# Patient Record
Sex: Female | Born: 1935 | Race: White | Hispanic: No | Smoking: Former smoker
Health system: Southern US, Community
[De-identification: ages and names within clinical notes are randomized; demographics above are authoritative.]

## PROBLEM LIST (undated history)

## (undated) DIAGNOSIS — N390 Urinary tract infection, site not specified: Secondary | ICD-10-CM

## (undated) DIAGNOSIS — E785 Hyperlipidemia, unspecified: Secondary | ICD-10-CM

## (undated) DIAGNOSIS — C50919 Malignant neoplasm of unspecified site of unspecified female breast: Secondary | ICD-10-CM

## (undated) DIAGNOSIS — F0390 Unspecified dementia without behavioral disturbance: Secondary | ICD-10-CM

## (undated) DIAGNOSIS — M1990 Unspecified osteoarthritis, unspecified site: Secondary | ICD-10-CM

## (undated) DIAGNOSIS — J309 Allergic rhinitis, unspecified: Secondary | ICD-10-CM

## (undated) DIAGNOSIS — I10 Essential (primary) hypertension: Secondary | ICD-10-CM

## (undated) DIAGNOSIS — K649 Unspecified hemorrhoids: Secondary | ICD-10-CM

## (undated) DIAGNOSIS — I4891 Unspecified atrial fibrillation: Secondary | ICD-10-CM

## (undated) DIAGNOSIS — R7303 Prediabetes: Secondary | ICD-10-CM

## (undated) DIAGNOSIS — I499 Cardiac arrhythmia, unspecified: Secondary | ICD-10-CM

## (undated) DIAGNOSIS — I809 Phlebitis and thrombophlebitis of unspecified site: Secondary | ICD-10-CM

## (undated) DIAGNOSIS — E162 Hypoglycemia, unspecified: Secondary | ICD-10-CM

## (undated) DIAGNOSIS — I5032 Chronic diastolic (congestive) heart failure: Secondary | ICD-10-CM

## (undated) DIAGNOSIS — K219 Gastro-esophageal reflux disease without esophagitis: Secondary | ICD-10-CM

## (undated) DIAGNOSIS — H9190 Unspecified hearing loss, unspecified ear: Secondary | ICD-10-CM

## (undated) DIAGNOSIS — I340 Nonrheumatic mitral (valve) insufficiency: Secondary | ICD-10-CM

## (undated) DIAGNOSIS — R160 Hepatomegaly, not elsewhere classified: Secondary | ICD-10-CM

## (undated) DIAGNOSIS — I951 Orthostatic hypotension: Secondary | ICD-10-CM

## (undated) HISTORY — DX: Gastro-esophageal reflux disease without esophagitis: K21.9

## (undated) HISTORY — DX: Allergic rhinitis, unspecified: J30.9

## (undated) HISTORY — DX: Prediabetes: R73.03

## (undated) HISTORY — DX: Essential (primary) hypertension: I10

## (undated) HISTORY — DX: Malignant neoplasm of unspecified site of unspecified female breast: C50.919

## (undated) HISTORY — DX: Unspecified atrial fibrillation: I48.91

## (undated) HISTORY — DX: Unspecified hearing loss, unspecified ear: H91.90

## (undated) HISTORY — DX: Hepatomegaly, not elsewhere classified: R16.0

## (undated) HISTORY — DX: Hypoglycemia, unspecified: E16.2

## (undated) HISTORY — PX: INNER EAR SURGERY: SHX679

## (undated) HISTORY — DX: Unspecified osteoarthritis, unspecified site: M19.90

## (undated) HISTORY — DX: Cardiac arrhythmia, unspecified: I49.9

## (undated) HISTORY — PX: FOOT SURGERY: SHX648

## (undated) HISTORY — DX: Urinary tract infection, site not specified: N39.0

## (undated) HISTORY — PX: BREAST LUMPECTOMY: SHX2

## (undated) HISTORY — PX: ABDOMINAL HYSTERECTOMY: SHX81

## (undated) HISTORY — DX: Phlebitis and thrombophlebitis of unspecified site: I80.9

## (undated) HISTORY — DX: Unspecified hemorrhoids: K64.9

## (undated) HISTORY — DX: Hyperlipidemia, unspecified: E78.5

## (undated) HISTORY — PX: BLADDER SUSPENSION: SHX72

---

## 1978-11-26 HISTORY — PX: CARDIAC CATHETERIZATION: SHX172

## 1999-06-10 ENCOUNTER — Encounter: Admission: RE | Admit: 1999-06-10 | Discharge: 1999-06-10 | Payer: Self-pay | Admitting: Surgery

## 1999-06-10 ENCOUNTER — Encounter: Payer: Self-pay | Admitting: Surgery

## 1999-10-25 ENCOUNTER — Encounter: Payer: Self-pay | Admitting: Geriatric Medicine

## 1999-10-25 ENCOUNTER — Inpatient Hospital Stay (HOSPITAL_COMMUNITY): Admission: EM | Admit: 1999-10-25 | Discharge: 1999-10-27 | Payer: Self-pay | Admitting: Emergency Medicine

## 1999-10-26 ENCOUNTER — Encounter: Payer: Self-pay | Admitting: Internal Medicine

## 2000-06-28 ENCOUNTER — Encounter (HOSPITAL_COMMUNITY): Payer: Self-pay | Admitting: Oncology

## 2000-06-28 ENCOUNTER — Encounter: Admission: RE | Admit: 2000-06-28 | Discharge: 2000-06-28 | Payer: Self-pay | Admitting: Oncology

## 2000-08-03 ENCOUNTER — Encounter (HOSPITAL_COMMUNITY): Admission: RE | Admit: 2000-08-03 | Discharge: 2000-09-02 | Payer: Self-pay | Admitting: Oncology

## 2000-08-03 ENCOUNTER — Encounter: Admission: RE | Admit: 2000-08-03 | Discharge: 2000-08-03 | Payer: Self-pay | Admitting: Oncology

## 2000-10-02 ENCOUNTER — Encounter: Payer: Self-pay | Admitting: Internal Medicine

## 2000-10-02 ENCOUNTER — Encounter: Admission: RE | Admit: 2000-10-02 | Discharge: 2000-10-02 | Payer: Self-pay | Admitting: Obstetrics and Gynecology

## 2001-07-01 ENCOUNTER — Encounter (HOSPITAL_COMMUNITY): Payer: Self-pay | Admitting: Oncology

## 2001-07-01 ENCOUNTER — Encounter: Admission: RE | Admit: 2001-07-01 | Discharge: 2001-07-01 | Payer: Self-pay | Admitting: Oncology

## 2001-09-13 ENCOUNTER — Ambulatory Visit (HOSPITAL_COMMUNITY): Admission: RE | Admit: 2001-09-13 | Discharge: 2001-09-13 | Payer: Self-pay | Admitting: Gastroenterology

## 2001-11-22 ENCOUNTER — Encounter: Admission: RE | Admit: 2001-11-22 | Discharge: 2001-11-22 | Payer: Self-pay | Admitting: Oncology

## 2002-04-02 ENCOUNTER — Ambulatory Visit (HOSPITAL_COMMUNITY): Admission: RE | Admit: 2002-04-02 | Discharge: 2002-04-02 | Payer: Self-pay | Admitting: Internal Medicine

## 2002-04-24 ENCOUNTER — Emergency Department (HOSPITAL_COMMUNITY): Admission: EM | Admit: 2002-04-24 | Discharge: 2002-04-24 | Payer: Self-pay | Admitting: Emergency Medicine

## 2002-07-03 ENCOUNTER — Encounter: Admission: RE | Admit: 2002-07-03 | Discharge: 2002-07-03 | Payer: Self-pay | Admitting: Oncology

## 2002-07-03 ENCOUNTER — Encounter (HOSPITAL_COMMUNITY): Payer: Self-pay | Admitting: Oncology

## 2002-08-07 ENCOUNTER — Encounter: Payer: Self-pay | Admitting: Emergency Medicine

## 2002-08-07 ENCOUNTER — Emergency Department (HOSPITAL_COMMUNITY): Admission: EM | Admit: 2002-08-07 | Discharge: 2002-08-08 | Payer: Self-pay | Admitting: Emergency Medicine

## 2002-09-25 ENCOUNTER — Observation Stay (HOSPITAL_COMMUNITY): Admission: RE | Admit: 2002-09-25 | Discharge: 2002-09-26 | Payer: Self-pay | Admitting: Urology

## 2002-09-28 ENCOUNTER — Emergency Department (HOSPITAL_COMMUNITY): Admission: EM | Admit: 2002-09-28 | Discharge: 2002-09-28 | Payer: Self-pay | Admitting: Emergency Medicine

## 2002-11-21 ENCOUNTER — Encounter (HOSPITAL_COMMUNITY): Admission: RE | Admit: 2002-11-21 | Discharge: 2002-12-21 | Payer: Self-pay | Admitting: Oncology

## 2002-11-21 ENCOUNTER — Encounter: Admission: RE | Admit: 2002-11-21 | Discharge: 2002-11-21 | Payer: Self-pay | Admitting: Oncology

## 2003-10-09 ENCOUNTER — Encounter: Admission: RE | Admit: 2003-10-09 | Discharge: 2003-10-09 | Payer: Self-pay | Admitting: Oncology

## 2003-11-20 ENCOUNTER — Encounter: Admission: RE | Admit: 2003-11-20 | Discharge: 2003-11-20 | Payer: Self-pay | Admitting: Oncology

## 2003-11-20 ENCOUNTER — Encounter (HOSPITAL_COMMUNITY): Admission: RE | Admit: 2003-11-20 | Discharge: 2003-12-20 | Payer: Self-pay | Admitting: Oncology

## 2004-08-17 ENCOUNTER — Encounter: Admission: RE | Admit: 2004-08-17 | Discharge: 2004-08-17 | Payer: Self-pay | Admitting: Internal Medicine

## 2004-10-17 ENCOUNTER — Encounter: Admission: RE | Admit: 2004-10-17 | Discharge: 2004-10-17 | Payer: Self-pay | Admitting: Oncology

## 2004-11-23 ENCOUNTER — Ambulatory Visit (HOSPITAL_COMMUNITY): Payer: Self-pay | Admitting: Oncology

## 2004-11-23 ENCOUNTER — Encounter (HOSPITAL_COMMUNITY): Admission: RE | Admit: 2004-11-23 | Discharge: 2004-12-23 | Payer: Self-pay | Admitting: Oncology

## 2004-11-23 ENCOUNTER — Encounter: Admission: RE | Admit: 2004-11-23 | Discharge: 2004-12-23 | Payer: Self-pay | Admitting: Oncology

## 2005-11-03 ENCOUNTER — Encounter: Admission: RE | Admit: 2005-11-03 | Discharge: 2005-11-03 | Payer: Self-pay | Admitting: Oncology

## 2005-11-10 ENCOUNTER — Encounter: Admission: RE | Admit: 2005-11-10 | Discharge: 2005-11-10 | Payer: Self-pay | Admitting: Internal Medicine

## 2005-11-20 ENCOUNTER — Encounter (HOSPITAL_COMMUNITY): Admission: RE | Admit: 2005-11-20 | Discharge: 2005-12-20 | Payer: Self-pay | Admitting: Oncology

## 2005-11-20 ENCOUNTER — Ambulatory Visit (HOSPITAL_COMMUNITY): Payer: Self-pay | Admitting: Oncology

## 2005-11-20 ENCOUNTER — Encounter: Admission: RE | Admit: 2005-11-20 | Discharge: 2005-11-20 | Payer: Self-pay | Admitting: Oncology

## 2006-05-29 ENCOUNTER — Encounter: Admission: RE | Admit: 2006-05-29 | Discharge: 2006-05-29 | Payer: Self-pay | Admitting: Internal Medicine

## 2006-06-04 ENCOUNTER — Encounter: Admission: RE | Admit: 2006-06-04 | Discharge: 2006-06-04 | Payer: Self-pay | Admitting: Internal Medicine

## 2006-11-09 ENCOUNTER — Encounter: Admission: RE | Admit: 2006-11-09 | Discharge: 2006-11-09 | Payer: Self-pay | Admitting: Oncology

## 2006-11-13 ENCOUNTER — Ambulatory Visit: Payer: Self-pay | Admitting: Vascular Surgery

## 2006-11-13 ENCOUNTER — Ambulatory Visit: Admission: RE | Admit: 2006-11-13 | Discharge: 2006-11-13 | Payer: Self-pay | Admitting: Internal Medicine

## 2006-11-19 ENCOUNTER — Ambulatory Visit (HOSPITAL_COMMUNITY): Payer: Self-pay | Admitting: Oncology

## 2007-11-04 ENCOUNTER — Emergency Department (HOSPITAL_COMMUNITY): Admission: EM | Admit: 2007-11-04 | Discharge: 2007-11-04 | Payer: Self-pay | Admitting: Family Medicine

## 2007-11-19 ENCOUNTER — Encounter: Admission: RE | Admit: 2007-11-19 | Discharge: 2007-11-19 | Payer: Self-pay | Admitting: Oncology

## 2007-12-17 ENCOUNTER — Ambulatory Visit (HOSPITAL_COMMUNITY): Payer: Self-pay | Admitting: Oncology

## 2008-02-10 ENCOUNTER — Ambulatory Visit: Payer: Self-pay | Admitting: Surgery

## 2008-03-14 ENCOUNTER — Emergency Department (HOSPITAL_COMMUNITY): Admission: EM | Admit: 2008-03-14 | Discharge: 2008-03-14 | Payer: Self-pay | Admitting: Emergency Medicine

## 2008-11-20 ENCOUNTER — Encounter: Admission: RE | Admit: 2008-11-20 | Discharge: 2008-11-20 | Payer: Self-pay | Admitting: Oncology

## 2009-02-05 ENCOUNTER — Ambulatory Visit (HOSPITAL_COMMUNITY): Payer: Self-pay | Admitting: Oncology

## 2010-01-21 ENCOUNTER — Encounter: Admission: RE | Admit: 2010-01-21 | Discharge: 2010-01-21 | Payer: Self-pay | Admitting: Oncology

## 2010-02-04 ENCOUNTER — Encounter (HOSPITAL_COMMUNITY): Admission: RE | Admit: 2010-02-04 | Payer: Self-pay | Admitting: Oncology

## 2010-04-15 ENCOUNTER — Ambulatory Visit: Admit: 2010-04-15 | Payer: Self-pay | Admitting: Internal Medicine

## 2010-04-17 ENCOUNTER — Encounter (HOSPITAL_COMMUNITY): Payer: Self-pay | Admitting: Oncology

## 2010-04-17 ENCOUNTER — Emergency Department (HOSPITAL_COMMUNITY)
Admission: EM | Admit: 2010-04-17 | Discharge: 2010-04-17 | Payer: Self-pay | Source: Home / Self Care | Admitting: Emergency Medicine

## 2010-04-19 LAB — CBC
Hemoglobin: 15.3 g/dL — ABNORMAL HIGH (ref 12.0–15.0)
MCH: 30.5 pg (ref 26.0–34.0)
MCHC: 33.9 g/dL (ref 30.0–36.0)
MCV: 90 fL (ref 78.0–100.0)
Platelets: 192 10*3/uL (ref 150–400)
RBC: 5.01 MIL/uL (ref 3.87–5.11)
RDW: 12.7 % (ref 11.5–15.5)

## 2010-04-19 LAB — DIFFERENTIAL: Basophils Absolute: 0 10*3/uL (ref 0.0–0.1)

## 2010-04-19 LAB — POCT I-STAT, CHEM 8
BUN: 12 mg/dL (ref 6–23)
Chloride: 107 mEq/L (ref 96–112)
Potassium: 4.5 mEq/L (ref 3.5–5.1)
TCO2: 30 mmol/L (ref 0–100)

## 2010-07-08 ENCOUNTER — Other Ambulatory Visit (HOSPITAL_COMMUNITY): Payer: Self-pay | Admitting: Oncology

## 2010-07-08 ENCOUNTER — Encounter (HOSPITAL_COMMUNITY): Payer: Medicare Other | Attending: Oncology | Admitting: Oncology

## 2010-07-08 DIAGNOSIS — Z8551 Personal history of malignant neoplasm of bladder: Secondary | ICD-10-CM | POA: Insufficient documentation

## 2010-07-08 DIAGNOSIS — Z853 Personal history of malignant neoplasm of breast: Secondary | ICD-10-CM | POA: Insufficient documentation

## 2010-07-08 DIAGNOSIS — I10 Essential (primary) hypertension: Secondary | ICD-10-CM | POA: Insufficient documentation

## 2010-07-08 DIAGNOSIS — C50919 Malignant neoplasm of unspecified site of unspecified female breast: Secondary | ICD-10-CM

## 2010-07-08 DIAGNOSIS — Z79899 Other long term (current) drug therapy: Secondary | ICD-10-CM | POA: Insufficient documentation

## 2010-07-08 LAB — DIFFERENTIAL
Lymphocytes Relative: 15 % (ref 12–46)
Lymphs Abs: 2.2 10*3/uL (ref 0.7–4.0)
Neutro Abs: 11.4 10*3/uL — ABNORMAL HIGH (ref 1.7–7.7)
Neutrophils Relative %: 79 % — ABNORMAL HIGH (ref 43–77)

## 2010-07-08 LAB — CBC
MCHC: 34.4 g/dL (ref 30.0–36.0)
Platelets: 197 10*3/uL (ref 150–400)
RDW: 13.3 % (ref 11.5–15.5)

## 2010-07-08 LAB — COMPREHENSIVE METABOLIC PANEL
ALT: 55 U/L — ABNORMAL HIGH (ref 0–35)
AST: 45 U/L — ABNORMAL HIGH (ref 0–37)
Alkaline Phosphatase: 63 U/L (ref 39–117)
CO2: 32 mEq/L (ref 19–32)
Calcium: 9.8 mg/dL (ref 8.4–10.5)
GFR calc Af Amer: >60 mL/min (ref >60)
GFR calc non Af Amer: >60 mL/min (ref >60)
Potassium: 4.8 mEq/L (ref 3.5–5.1)
Total Protein: 7.4 g/dL (ref 6.0–8.3)

## 2010-07-09 LAB — CANCER ANTIGEN 27.29: CA 27.29: 14 U/mL (ref 0–39)

## 2010-07-23 ENCOUNTER — Inpatient Hospital Stay (INDEPENDENT_AMBULATORY_CARE_PROVIDER_SITE_OTHER)
Admission: RE | Admit: 2010-07-23 | Discharge: 2010-07-23 | Disposition: A | Discharge status: Home / Self Care | Payer: Medicare Other | Source: Ambulatory Visit | Attending: Emergency Medicine | Admitting: Emergency Medicine

## 2010-07-23 DIAGNOSIS — S91009A Unspecified open wound, unspecified ankle, initial encounter: Secondary | ICD-10-CM

## 2010-08-09 NOTE — Assessment & Plan Note (Signed)
OFFICE VISIT   Alexis Winters, Alexis Winters  DOB:  1927-04-05                                       02/10/2008  [id number]#:[id number]   REASON FOR VISIT:  Evaluate swelling.   HISTORY:  This is a 77 year old female I am seeing at the request of Dr.  Donette Larry for evaluation of leg swelling.  The patient states that she has  had problems with swelling for many, many years.  She is a history which  began back in 1956 where she developed left leg phlebitis and she has  had problems with swelling ever since.  She has had a total of about 3  episodes of recurrent cellulitis, last of which was 1 year ago.  She has  tried stockings in the past but has yet to receive a pair that has  fitted appropriately.  She has not had any major ulceration or wound  problems.  She does have occasional discomfort.   REVIEW OF SYSTEMS:  CONSTITUTIONAL:  Negative fevers, chills weight  gain, weight loss.  CARDIAC:  Positive for palpitations, heart murmur.  PULMONARY:  Negative.  GI:  Negative.  GU:  Negative.  VASCULAR:  Pain in legs with walking and a history of phlebitis.  NEURO:  Negative.  ORTHO:  Positive arthritis, joint pain, muscle pain.  PSYCH:  Negative.  ENT:  Change in hearing.  HEMATOLOGIC:  Negative.   PAST MEDICAL HISTORY:  Hypertension, heart failure, history of bladder  sling, history of breast cancer.   FAMILY HISTORY:  Positive for cardiovascular disease in her brother at  age 29.   SOCIAL HISTORY:  She has 3 children.  She is retired.  Does not smoke.  Has a history smoking but quit in 1967.   MEDICATIONS:  Include Toprol, Lasix, trimethoprim, Klor-Con,  simvastatin, aspirin, acid reducer, fish oil.   ALLERGIES:  IV dye.   PHYSICAL EXAMINATION:  Her blood pressure is 147/81, pulse 60.  General:  She is well appearing in no distress.  Cardiovascular:  Regular rate and  rhythm, respirations nonlabored.  Extremities are well perfused.  Pedal  pulses are not palpable.   She has nonpitting edema left greater than  right.  No ulceration.   DIAGNOSTIC STUDIES:  Venous duplex was performed today which shows no  evidence of deep venous thrombosis.  No evidence of superficial venous  thrombosis and no evidence of reflux.   ASSESSMENT/PLAN:  Bilateral leg swelling, left greater than right.   PLAN:  The most likely etiology for the patient's swelling is  lymphedema.  I told her that the best treatment option at this time is  going to be compression therapy.  I have recommend that she see a  lymphedema therapist.  She will need to be fitted for appropriate  stockings to avoid potential future complications including ulceration.   Jorge Ny, MD  Electronically Signed   VWB/MEDQ  D:  02/10/2008  T:  02/11/2008  Job:  [id number]   cc:   Georgann Housekeeper, MD

## 2010-08-09 NOTE — Procedures (Signed)
DUPLEX DEEP VENOUS EXAM - LOWER EXTREMITY   INDICATION:  Bilateral lower extremity edema.   HISTORY:  Edema:  Bilateral lower extremity edema.  Trauma/Surgery:  No.  Pain:  No.  PE:  No.  Previous DVT:  History of phlebitis in the 1950s.  Anticoagulants:  No.  Other:  No.   DUPLEX EXAM:                CFV   SFV   PopV  PTV    GSV                R  L  R  L  R  L  R   L  R  L  Thrombosis    o  o  o  o  o  o  o   o  o  o  Spontaneous   +  +  +  +  +  +  +   +  +  +  Phasic        +  +  +  +  +  +  +   +  +  +  Augmentation  +  +  +  +  +  +  +   +  +  +  Compressible  +  +  +  +  +  +  +   +  +  +  Competent     +  +  +  +  +  +  +   +  +  +   Legend:  + - yes  o - no  p - partial  D - decreased   IMPRESSION:  1. No evidence of deep or superficial venous thrombus bilaterally.  2. No evidence of baker's cyst bilaterally.  3. No evidence of significant venous incompetence bilaterally.    _____________________________  V. Charlena Cross, MD   MC/MEDQ  D:  02/10/2008  T:  02/10/2008  Job:  [id number]

## 2010-08-12 NOTE — Op Note (Signed)
   NAME:  Alexis Winters, Alexis Winters                        ACCOUNT NO.:  [id number]   MEDICAL RECORD NO.:  [id number]                   PATIENT TYPE:  OBV   LOCATION:  0376                                 FACILITY:  [facility]   PHYSICIAN:  Sigmund I. Patsi Sears, M.D.         DATE OF BIRTH:  1927-03-29   DATE OF PROCEDURE:  DATE OF DISCHARGE:                                 OPERATIVE REPORT   PREOPERATIVE DIAGNOSIS:  Hematuria.   POSTOPERATIVE DIAGNOSIS:  Hematuria.   OPERATION/PROCEDURE:  Flexible sigmoidoscopy.   SURGEON:  Sigmund I. Patsi Sears, M.D.   ASSISTANT:  ______________.   INDICATIONS FOR PROCEDURE:  After Mrs. Colasurdo' sling procedure for stress  urinary incontinence, we received a call that the patient had gross  hematuria in her Foley catheter.  We returned to the operating room.  The  patient was still under general anesthesia.  We removed the patient's Foley  catheter and performed a flexible cystoscopy.   FINDINGS:  There were two small blood clots in the patient's bladder but no  actual sites of bleeding were seen.  There was some mild erythema in the  lateral walls of her bladder.  Specifically no foreign material was  identified under careful cystoscopic surveillance.  Therefore, the  cystoscope was removed and Foley catheter was reinserted.   DISPOSITION:  To the post anesthesia care unit in stable condition.  Please  note that Dr. Patsi Sears participated throughout this case.  There were no  complications.     UNKNOWN DICTATOR                          Sigmund I. Patsi Sears, M.D.    UD/MEDQ  D:  09/25/2002  T:  09/25/2002  Job:  [id number]

## 2010-08-12 NOTE — Discharge Summary (Signed)
[place]  Patient:    Alexis Winters                        MRN: [id number] Adm. Date:  [id number] Disc. Date: [id number] Attending:  Katha Cabal                           Discharge Summary  CONSULTATIONS:  Dr. Mayford Knife, cardiology.  HISTORY OF PRESENT ILLNESS/HOSPITAL COURSE:  Alexis Winters is a 77 year old white female with a long history of palpitations dating back to the 1980s, who was admitted on 10/25/99 with a history of awakening that morning with some substernal chest pressure and diaphoresis.  The patient also was with significant dyspnea, particularly on exertion, and felt presyncopal.  She denies any palpitations at the time of admission, but had noted that her heart seemed to be beating slowly the day of admission.  The patient is with a history of heart catheterization in 1982 with a reportedly normal study.  Apparently, she had reaction to the dye that was used.  MEDICATIONS:  On admission included: 1. Lanoxin. 2. Lopressor. 3. Dyazide. 4. Potassium.  On admission, her pulse was noted to be 70 and irregular, blood pressure was 132/80, respiratory rate was 24.  The patient was noted to have bibasilar crackles and to be in irregularly regular rhythm without murmur.  She had some chronic lower extremity edema.  The EKG showed new onset atrial fibrillation, and the patient was sent to the hospital from the office and admitted by Dr. Pete Glatter.  The patient was anticoagulated.  A spiral CT scan of the chest was obtained and pulmonary embolus was indeed ruled out by that study.  Cardiac enzymes, including CPK, MB, and troponin I were ordered on a serial basis, all of which returned normal as well.  The EKG did not show any ischemic changes.  The patient converted to normal sinus rhythm the morning of October 26, 1999. Dr. Deirdre Pippins subsequently evaluated the patient and recommended the addition of Cardizem to her beta  blockage, oral Coumadin, and to plan for an outpatient Cardiolite study to rule out ischemic etiology.  Apparently, she had had a recent transthoracic echocardiogram that was fairly normal.  Apparently, there is a question of mitral valve prolapse.  The patient was started on Cardizem CD 120 mg daily, Coumadin 5 mg daily.  She was tolerating both of these medications on the morning of October 27, 1999. Her lungs had cleared.  Cardiovascular exam was normal and a TSH that had been ordered previously was also within normal limits.  The patient was thus discharged in stable condition on Lopressor 50 mg twice daily, Dyazide 37.5/12.5 1 tablet daily, potassium tablet once daily, Cardizem CD 120 mg once daily, Coumadin 5 mg once daily, and to discontinue her Lanoxin.  She was to follow up at the [facility] office for a pro time the following morning, to call the office if she developed chest pain, shortness of breath, or palpitations; Dr. Carroll Sage office to arrange for outpatient Cardiolite; and to follow up with Dr. Daphine Deutscher in two weeks. DD:  10/29/99 TD:  10/31/99 Job: [id number]

## 2010-08-12 NOTE — Op Note (Signed)
   NAME:  Alexis Winters, Alexis Winters                        ACCOUNT NO.:  [id number]   MEDICAL RECORD NO.:  [id number]                   PATIENT TYPE:  AMB   LOCATION:  DAY                                  FACILITY:  [facility]   PHYSICIAN:  Sigmund I. Patsi Sears, M.D.         DATE OF BIRTH:  [birth year]   DATE OF PROCEDURE:  09/25/2002  DATE OF DISCHARGE:                                 OPERATIVE REPORT   PREOPERATIVE DIAGNOSIS:  Urinary incontinence.   POSTOPERATIVE DIAGNOSIS:  Urinary incontinence.   PROCEDURE:  Mentor OB tape pubovaginal sling.   SURGEON:  Sigmund I. Patsi Sears, M.D.   Threasa HeadsIleene Hutchinson, M.D.   PREOPERATIVE PREPARATION:  After appropriate preanesthesia, the patient was  brought into the operating room and placed on the operating table in the  dorsal supine position, where general LMA anesthesia was induced.  She was  replaced in dorsal lithotomy position, where the pubis was prepped with  Betadine solution and draped in the usual fashion.   REVIEW OF HISTORY:  The patient is a 77 year old female with a history of  stress urinary incontinence, positive Marshall test, positive Q-Tip test,  urodynamic leak point pressure of 16 cmH2O.  Note her past history of breast  cancer, phlebitis, and a history of congestive heart failure.  Patient now  for pubovaginal sling.   PROCEDURE:  Marcaine 0.25% plus epinephrine 1:200,000 10 mL was injected  into the periurethral pubovaginal cervical tissue.  Following this, a  midline incision is made over the urethra and subcutaneous tissue was  dissected bilaterally.  Two stab wounds are then placed 5 cm lateral to the  clitoris, over the obturator bone, and bilateral obturator fossae tractors  were placed, bringing the sling into its position.  Cystoscopy showed no  evidence of the sling within the bladder.  With the right angle clamp behind  the sling in the midline for tensioning, the sides of the tape were cut  subcutaneously, and a  portion of 2 x 7 cm Tutoplast fascia was used to cover  the sling.  The vaginal incision was then closed in one layer with running 3-  0 Vicryl suture.   Some bleeding was noted from the stab wounds, and this was controlled with  sutures and pressure.  Dermabond was used to close the perivaginal stab  wounds and the patient was awakened after given IV Toradol, taken to the  recovery room in good condition.                                               Sigmund I. Patsi Sears, M.D.    SIT/MEDQ  D:  09/25/2002  T:  09/25/2002  Job:  [id number]

## 2010-08-12 NOTE — H&P (Signed)
[place]  Patient:    Alexis Winters, Alexis Winters                       MRN: [id number] Adm. Date:  10/25/99 Attending:  Ann Maki T. Stoneking, M.D. CC:         Richard A. Alanda Amass, M.D.                         History and Physical  CHIEF COMPLAINT: Ms. Batres is a very nice 77 year old white female with a long history of palpitations dating back to the 9s.  Some question if she has had supraventricular tachycardia in the past.  She apparently had a heart catheterization in 1982, reportedly normal study but according to the family she arrested during the procedure requiring cardioversion.  She has since that time been on Lanoxin 0.125 mg q.d., Lopressor 50 mg b.i.d.  She was in essentially her usual state of health until the last week or two when she occasionally has felt a little bit weak and tired.  This morning she awakened with substernal chest pressure and diaphoresis.  HISTORY OF PRESENT ILLNESS: This lasted about five to ten minutes and then during the day today she noticed increasing dyspnea on exertion with minimal exertion, just taking a couple of steps, which is new.  This evening she felt as if she would faint.  She has not been aware of any palpitations but has noted that her heart seems to be beating slow.  ALLERGIES: No known drug allergies.  CURRENT MEDICATIONS:  1. Lanoxin 0.25 mg 1/2 tablet q.d.  2. Lopressor 50 mg b.i.d.  3. Dyazide 37.5/12.5 mg q.d.  4. Potassium tablet q.d.  PAST MEDICAL HISTORY:  1. Left-sided breast cancer, status post lumpectomy in 1991 and tamoxifen     therapy.  2. SVT.  3. Question of history of mitral valve prolapse.  4. History of presbycusis, wears hearing aids.  There is no history of known coronary artery disease, stroke, tuberculosis, or diabetes.  PAST SURGICAL HISTORY:  1. Hysterectomy.  2. Surgery of left tympanic membrane in the past.  3. Repair of left ankle fracture.  4. Left  lumpectomy.  FAMILY HISTORY: Father died at age 30, had a stroke.  Mother died at age 49 of renal disease.  Two daughters and one son in fairly good health.  SOCIAL HISTORY: She is a widow and has three children.  She does not smoke and does not consume alcohol.  REVIEW OF SYSTEMS: She has had no trouble with headaches though she has some pain occasionally in the back of her neck, especially this morning.  She has had no acute change in her vision.  Hearing loss has been stable and the hearing aids seem to work fairly well.  CARDIOVASCULAR: Please see HPI.  She denies any problem with change in her bowels and states her bowels move fairly regularly and there is no dysuria.  No abdominal pain.  PHYSICAL EXAMINATION:  VITAL SIGNS: Pulse 70 and irregular.  Blood pressure 132/80, respiratory rate 24.  SKIN: Warm and dry.  HEENT: PERRL.  Fundi normal.  Right TM normal, left occluded by cerumen.  LUNGS: Crackles noted at the bases.  HEART: Irregular rhythm without murmur.  ABDOMEN: Obese, no hepatosplenomegaly or mass palpated.  RECTAL: Normal tone.  There was no stool in the rectal vault.  NEUROLOGIC: Nonfocal.  EXTREMITIES: There was 1+ edema of the left ankle, which  is chronic, and trace edema on the right.  ASSESSMENT:  1. New onset atrial fibrillation, rate controlled by examination on digoxin     and Lopressor; however, she presents with chest pain and moderate     shortness of breath, and question if she as mild congestive heart failure     due to the atrial fibrillation.  Also cannot rule out pulmonary embolism     as a cause of the chest pain, shortness of breath, and new onset of atrial     fibrillation.  2. Obesity.  PLAN: Will admit, anticoagulate, rule out MI.  Will check chest x-ray to look for signs of failure and also CT scan to rule out pulmonary embolus.  Will monitor her rhythm and will have Dr. Alanda Amass see her to help convert her back to normal sinus  rhythm if he feels this is indicated at this time. DD:  10/25/99 TD:  10/26/99 Job: [id number]

## 2010-08-12 NOTE — Procedures (Signed)
[place]  Patient:    Alexis Winters, Alexis Winters Visit Number: [id number] MRN: [id number]          Service Type: END Location: ENDO Attending Physician:  Dennison Bulla Ii Dictated by:   Verlin Grills, M.D. Proc. Date: 09/13/01 Admit Date:  09/13/2001 Discharge Date: 09/13/2001   CC:         Darius Bump, M.D.   Procedure Report  REFERRING PHYSICIAN:  Darius Bump, M.D.  INDICATIONS FOR PROCEDURE:  [name] is a 77 year old female born 1927-03-31.  [name] is undergoing diagnostic colonoscopy to evaluate intermittent hematochezia.  [name] is in chronic atrial fibrillation and takes Coumadin anticoagulation.  I discussed with [name] the complications associated with colonoscopy and polypectomy, including intestinal bleeding and intestinal perforation. [name] has signed the operative permit.  ENDOSCOPIST:  Verlin Grills, M.D.  PREMEDICATIONS: 1. Phentanyl 50 mcg. 2. Versed 6 mg.  ENDOSCOPE:  Olympus pediatric colonoscope.  DESCRIPTION OF PROCEDURE:  After obtaining informed consent, [name] was placed in the left lateral decubitus position.  I administered intravenous phentanyl and intravenous Versed to achieve conscious sedation for the procedure.  The patients blood pressure, oxygen saturation, and cardiac rhythm were monitored throughout the procedure and documented in the medical record.  Anal inspection was normal.  Digital rectal examination was normal.  The Olympus pediatric video colonoscope was then introduced into the rectum and easily advanced to the cecum.  Colonic preparation for the examination today was excellent.  Rectum normal.  Sigmoid colon and descending colon normal.  Splenic flexure normal.  Transverse colon normal.  Hepatic flexure normal.  Ascending colon normal.  Cecum and ileocecal valve normal.  ASSESSMENT:  Normal  proctocolonoscopy to the cecum.  No endoscopic evidence for the presence of colorectal neoplasia or lower gastrointestinal bleeding.Dictated by:   Verlin Grills, M.D. Attending Physician:  Dennison Bulla Ii DD:  09/13/01 TD:  09/15/01 Job: [id number]

## 2010-10-26 ENCOUNTER — Other Ambulatory Visit: Payer: Self-pay | Admitting: Neurology

## 2010-10-26 DIAGNOSIS — R259 Unspecified abnormal involuntary movements: Secondary | ICD-10-CM

## 2010-10-26 DIAGNOSIS — R269 Unspecified abnormalities of gait and mobility: Secondary | ICD-10-CM

## 2010-10-26 DIAGNOSIS — I4891 Unspecified atrial fibrillation: Secondary | ICD-10-CM

## 2010-10-26 DIAGNOSIS — F068 Other specified mental disorders due to known physiological condition: Secondary | ICD-10-CM

## 2010-10-27 ENCOUNTER — Ambulatory Visit
Admission: RE | Admit: 2010-10-27 | Discharge: 2010-10-27 | Disposition: A | Discharge status: Home / Self Care | Payer: Medicare Other | Source: Ambulatory Visit | Attending: Neurology | Admitting: Neurology

## 2010-10-27 DIAGNOSIS — I4891 Unspecified atrial fibrillation: Secondary | ICD-10-CM

## 2010-10-27 DIAGNOSIS — R269 Unspecified abnormalities of gait and mobility: Secondary | ICD-10-CM

## 2010-10-27 DIAGNOSIS — F068 Other specified mental disorders due to known physiological condition: Secondary | ICD-10-CM

## 2010-10-27 DIAGNOSIS — R259 Unspecified abnormal involuntary movements: Secondary | ICD-10-CM

## 2010-12-23 LAB — POCT URINALYSIS DIP (DEVICE)
Nitrite: NEGATIVE
Operator id: 200941
Protein, ur: NEGATIVE
Urobilinogen, UA: 0.2

## 2010-12-29 LAB — DIFFERENTIAL
Basophils Relative: 0 % (ref 0–1)
Eosinophils Absolute: 0 10*3/uL (ref 0.0–0.7)
Lymphocytes Relative: 15 % (ref 12–46)
Neutro Abs: 7.6 10*3/uL (ref 1.7–7.7)
Neutrophils Relative %: 76 % (ref 43–77)

## 2010-12-29 LAB — CBC
HCT: 44.7 % (ref 36.0–46.0)
MCHC: 35.1 g/dL (ref 30.0–36.0)
MCV: 90.9 fL (ref 78.0–100.0)
Platelets: 109 10*3/uL — ABNORMAL LOW (ref 150–400)
RDW: 12.4 % (ref 11.5–15.5)
WBC: 9.9 10*3/uL (ref 4.0–10.5)

## 2010-12-29 LAB — COMPREHENSIVE METABOLIC PANEL
Albumin: 3.7 g/dL (ref 3.5–5.2)
BUN: 12 mg/dL (ref 6–23)
Chloride: 107 mEq/L (ref 96–112)
Creatinine, Ser: 0.8 mg/dL (ref 0.4–1.2)
GFR calc Af Amer: >60 mL/min (ref >60)
GFR calc non Af Amer: >60 mL/min (ref >60)
Glucose, Bld: 120 mg/dL — ABNORMAL HIGH (ref 70–99)
Sodium: 139 mEq/L (ref 135–145)

## 2011-04-19 ENCOUNTER — Other Ambulatory Visit (HOSPITAL_COMMUNITY): Payer: Self-pay | Admitting: Oncology

## 2011-04-19 DIAGNOSIS — Z1231 Encounter for screening mammogram for malignant neoplasm of breast: Secondary | ICD-10-CM

## 2011-04-21 ENCOUNTER — Ambulatory Visit
Admission: RE | Admit: 2011-04-21 | Discharge: 2011-04-21 | Disposition: A | Discharge status: Home / Self Care | Payer: Medicare Other | Source: Ambulatory Visit | Attending: Oncology | Admitting: Oncology

## 2011-04-21 ENCOUNTER — Ambulatory Visit: Payer: Medicare Other

## 2011-04-21 DIAGNOSIS — Z1231 Encounter for screening mammogram for malignant neoplasm of breast: Secondary | ICD-10-CM

## 2011-04-26 ENCOUNTER — Other Ambulatory Visit (HOSPITAL_COMMUNITY): Payer: Self-pay | Admitting: Oncology

## 2011-04-26 DIAGNOSIS — R928 Other abnormal and inconclusive findings on diagnostic imaging of breast: Secondary | ICD-10-CM

## 2011-05-05 ENCOUNTER — Ambulatory Visit
Admission: RE | Admit: 2011-05-05 | Discharge: 2011-05-05 | Disposition: A | Discharge status: Home / Self Care | Payer: Medicare Other | Source: Ambulatory Visit | Attending: Oncology | Admitting: Oncology

## 2011-05-05 DIAGNOSIS — R928 Other abnormal and inconclusive findings on diagnostic imaging of breast: Secondary | ICD-10-CM

## 2011-05-05 DIAGNOSIS — Z853 Personal history of malignant neoplasm of breast: Secondary | ICD-10-CM | POA: Diagnosis not present

## 2011-07-07 ENCOUNTER — Encounter (HOSPITAL_COMMUNITY): Payer: Medicare Other | Admitting: Oncology

## 2011-07-14 ENCOUNTER — Ambulatory Visit (HOSPITAL_COMMUNITY): Payer: Medicare Other | Admitting: Oncology

## 2011-07-14 DIAGNOSIS — M5137 Other intervertebral disc degeneration, lumbosacral region: Secondary | ICD-10-CM | POA: Diagnosis not present

## 2011-07-14 DIAGNOSIS — M1710 Unilateral primary osteoarthritis, unspecified knee: Secondary | ICD-10-CM | POA: Diagnosis not present

## 2011-07-14 DIAGNOSIS — M76899 Other specified enthesopathies of unspecified lower limb, excluding foot: Secondary | ICD-10-CM | POA: Diagnosis not present

## 2011-11-02 DIAGNOSIS — Z1331 Encounter for screening for depression: Secondary | ICD-10-CM | POA: Diagnosis not present

## 2011-11-02 DIAGNOSIS — I10 Essential (primary) hypertension: Secondary | ICD-10-CM | POA: Diagnosis not present

## 2011-11-02 DIAGNOSIS — F411 Generalized anxiety disorder: Secondary | ICD-10-CM | POA: Diagnosis not present

## 2011-11-02 DIAGNOSIS — R5383 Other fatigue: Secondary | ICD-10-CM | POA: Diagnosis not present

## 2011-12-07 DIAGNOSIS — H6120 Impacted cerumen, unspecified ear: Secondary | ICD-10-CM | POA: Diagnosis not present

## 2011-12-07 DIAGNOSIS — H903 Sensorineural hearing loss, bilateral: Secondary | ICD-10-CM | POA: Diagnosis not present

## 2012-02-06 DIAGNOSIS — C50919 Malignant neoplasm of unspecified site of unspecified female breast: Secondary | ICD-10-CM | POA: Diagnosis not present

## 2012-02-06 DIAGNOSIS — Z23 Encounter for immunization: Secondary | ICD-10-CM | POA: Diagnosis not present

## 2012-02-06 DIAGNOSIS — M1990 Unspecified osteoarthritis, unspecified site: Secondary | ICD-10-CM | POA: Diagnosis not present

## 2012-02-06 DIAGNOSIS — I10 Essential (primary) hypertension: Secondary | ICD-10-CM | POA: Diagnosis not present

## 2012-03-29 DIAGNOSIS — R3989 Other symptoms and signs involving the genitourinary system: Secondary | ICD-10-CM | POA: Diagnosis not present

## 2012-03-29 DIAGNOSIS — N3020 Other chronic cystitis without hematuria: Secondary | ICD-10-CM | POA: Diagnosis not present

## 2012-04-04 DIAGNOSIS — J069 Acute upper respiratory infection, unspecified: Secondary | ICD-10-CM | POA: Diagnosis not present

## 2012-04-10 ENCOUNTER — Other Ambulatory Visit (HOSPITAL_COMMUNITY): Payer: Self-pay | Admitting: Oncology

## 2012-04-10 DIAGNOSIS — Z1231 Encounter for screening mammogram for malignant neoplasm of breast: Secondary | ICD-10-CM

## 2012-04-11 ENCOUNTER — Ambulatory Visit: Payer: Medicare Other

## 2012-04-11 ENCOUNTER — Ambulatory Visit (HOSPITAL_COMMUNITY): Payer: Medicare Other

## 2012-04-12 ENCOUNTER — Ambulatory Visit (HOSPITAL_COMMUNITY): Payer: Medicare Other

## 2012-04-12 ENCOUNTER — Encounter (HOSPITAL_COMMUNITY): Payer: Self-pay | Admitting: Oncology

## 2012-04-12 ENCOUNTER — Encounter (HOSPITAL_COMMUNITY): Payer: Medicare Other | Attending: Oncology | Admitting: Oncology

## 2012-04-12 VITALS — BP 153/63 | HR 71 | Temp 97.4°F | Resp 16 | Wt 126.8 lb

## 2012-04-12 DIAGNOSIS — Z853 Personal history of malignant neoplasm of breast: Secondary | ICD-10-CM

## 2012-04-12 DIAGNOSIS — Z170 Estrogen receptor positive status [ER+]: Secondary | ICD-10-CM

## 2012-04-12 DIAGNOSIS — C50919 Malignant neoplasm of unspecified site of unspecified female breast: Secondary | ICD-10-CM

## 2012-04-12 NOTE — Patient Instructions (Addendum)
[facility] Discharge Instructions  RECOMMENDATIONS MADE BY THE CONSULTANT AND ANY TEST RESULTS WILL BE SENT TO YOUR REFERRING PHYSICIAN.  EXAM FINDINGS BY THE PHYSICIAN TODAY AND SIGNS OR SYMPTOMS TO REPORT TO CLINIC OR PRIMARY PHYSICIAN: exam and discussion by MD.  Alexis Winters are doing well.  MEDICATIONS PRESCRIBED:  none  INSTRUCTIONS GIVEN AND DISCUSSED: Report any new lumps, bone pain or shortness of breath.  SPECIAL INSTRUCTIONS/FOLLOW-UP: Return for follow-up in 1 year.  Thank you for choosing [facility] to provide your oncology and hematology care.  To afford each patient quality time with our providers, please arrive at least 15 minutes before your scheduled appointment time.  With your help, our goal is to use those 15 minutes to complete the necessary work-up to ensure our physicians have the information they need to help with your evaluation and healthcare recommendations.    Effective January 1st, 2014, we ask that you re-schedule your appointment with our physicians should you arrive 10 or more minutes late for your appointment.  We strive to give you quality time with our providers, and arriving late affects you and other patients whose appointments are after yours.    Again, thank you for choosing [facility].  Our hope is that these requests will decrease the amount of time that you wait before being seen by our physicians.       _____________________________________________________________  Should you have questions after your visit to [facility], please contact our office at [phone] between the hours of 8:30 a.m. and 5:00 p.m.  Voicemails left after 4:30 p.m. will not be returned until the following business day.  For prescription refill requests, have your pharmacy contact our office with your prescription refill request.

## 2012-04-12 NOTE — Progress Notes (Signed)
Problem number 1 stage I, left-sided breast cancer, infiltrating ductal type, 1.0 cm, ER/PR positive, status post lumpectomy followed by radiation therapy to the left breast with surgery in August 1991 thus far without recurrence. She took 5 years of adjuvant tamoxifen. Problem #2 history of CHF Problem #3 mild memory loss Problem #4 obesity in the past which is resolved Problem #5 difficulty hearing Problem #6 severe degenerative arthritis the left knee unable to have surgery but she is wearing a brace. Problem #7 IVP dye allergy  She is accompanied by her daughter Andrey Campanile. She is doing very well but has moved from [place] to the [facility] area. She is very close to her daughter. They are within 1/2 mile of each other.  The patient appears more forgetful. Otherwise she is very pleasant. She is not aware any lumps or bumps anywhere. Her daughter helps her with several questions. Otherwise her vital signs are good but her weight is down 4 more pounds in one year and 9 months. She is in no acute distress. Lymph nodes are negative throughout. Both breasts are negative for any masses. Heart shows no distinct murmur rub or gallop. There appears to be a regular rhythm and rate. Abdomen is soft and nontender without hepatosplenomegaly. She has chronic lower left leg  swelling. She is wearing a brace on her left knee. She appears to have more in the way of fatty ankles rather than pitting edema.   She did lose her oldest daughter to the complications of cerebral palsy this past year. Her daughter was 81 years old.   I will not do blood work on her today. I do not see that  she needs it. Her daughter states that she had recent blood work by her primary care physician.  We will see her in one year.

## 2012-05-16 ENCOUNTER — Ambulatory Visit
Admission: RE | Admit: 2012-05-16 | Discharge: 2012-05-16 | Disposition: A | Discharge status: Home / Self Care | Payer: Medicare Other | Source: Ambulatory Visit | Attending: Oncology | Admitting: Oncology

## 2012-05-16 DIAGNOSIS — Z1231 Encounter for screening mammogram for malignant neoplasm of breast: Secondary | ICD-10-CM | POA: Diagnosis not present

## 2012-05-23 DIAGNOSIS — C50919 Malignant neoplasm of unspecified site of unspecified female breast: Secondary | ICD-10-CM | POA: Diagnosis not present

## 2012-05-23 DIAGNOSIS — N3090 Cystitis, unspecified without hematuria: Secondary | ICD-10-CM | POA: Diagnosis not present

## 2012-05-23 DIAGNOSIS — I10 Essential (primary) hypertension: Secondary | ICD-10-CM | POA: Diagnosis not present

## 2012-05-23 DIAGNOSIS — M1990 Unspecified osteoarthritis, unspecified site: Secondary | ICD-10-CM | POA: Diagnosis not present

## 2012-06-25 ENCOUNTER — Encounter (HOSPITAL_COMMUNITY): Payer: Self-pay | Admitting: *Deleted

## 2012-06-25 ENCOUNTER — Emergency Department (HOSPITAL_COMMUNITY)
Admission: EM | Admit: 2012-06-25 | Discharge: 2012-06-26 | Disposition: A | Discharge status: Home / Self Care | Payer: Medicare Other | Attending: Emergency Medicine | Admitting: Emergency Medicine

## 2012-06-25 ENCOUNTER — Emergency Department (HOSPITAL_COMMUNITY): Payer: Medicare Other

## 2012-06-25 DIAGNOSIS — S99929A Unspecified injury of unspecified foot, initial encounter: Secondary | ICD-10-CM | POA: Insufficient documentation

## 2012-06-25 DIAGNOSIS — Z87891 Personal history of nicotine dependence: Secondary | ICD-10-CM | POA: Insufficient documentation

## 2012-06-25 DIAGNOSIS — W010XXA Fall on same level from slipping, tripping and stumbling without subsequent striking against object, initial encounter: Secondary | ICD-10-CM | POA: Insufficient documentation

## 2012-06-25 DIAGNOSIS — S99919A Unspecified injury of unspecified ankle, initial encounter: Secondary | ICD-10-CM | POA: Diagnosis not present

## 2012-06-25 DIAGNOSIS — S8990XA Unspecified injury of unspecified lower leg, initial encounter: Secondary | ICD-10-CM | POA: Diagnosis not present

## 2012-06-25 DIAGNOSIS — W19XXXA Unspecified fall, initial encounter: Secondary | ICD-10-CM

## 2012-06-25 DIAGNOSIS — S0003XA Contusion of scalp, initial encounter: Secondary | ICD-10-CM | POA: Diagnosis not present

## 2012-06-25 DIAGNOSIS — Z8679 Personal history of other diseases of the circulatory system: Secondary | ICD-10-CM | POA: Diagnosis not present

## 2012-06-25 DIAGNOSIS — S6990XA Unspecified injury of unspecified wrist, hand and finger(s), initial encounter: Secondary | ICD-10-CM | POA: Insufficient documentation

## 2012-06-25 DIAGNOSIS — Z853 Personal history of malignant neoplasm of breast: Secondary | ICD-10-CM | POA: Insufficient documentation

## 2012-06-25 DIAGNOSIS — S1093XA Contusion of unspecified part of neck, initial encounter: Secondary | ICD-10-CM | POA: Insufficient documentation

## 2012-06-25 DIAGNOSIS — I509 Heart failure, unspecified: Secondary | ICD-10-CM | POA: Insufficient documentation

## 2012-06-25 DIAGNOSIS — T148XXA Other injury of unspecified body region, initial encounter: Secondary | ICD-10-CM

## 2012-06-25 DIAGNOSIS — Z7982 Long term (current) use of aspirin: Secondary | ICD-10-CM | POA: Diagnosis not present

## 2012-06-25 DIAGNOSIS — S0993XA Unspecified injury of face, initial encounter: Secondary | ICD-10-CM | POA: Diagnosis not present

## 2012-06-25 DIAGNOSIS — Z8669 Personal history of other diseases of the nervous system and sense organs: Secondary | ICD-10-CM | POA: Insufficient documentation

## 2012-06-25 DIAGNOSIS — Z79899 Other long term (current) drug therapy: Secondary | ICD-10-CM | POA: Diagnosis not present

## 2012-06-25 DIAGNOSIS — Z862 Personal history of diseases of the blood and blood-forming organs and certain disorders involving the immune mechanism: Secondary | ICD-10-CM | POA: Insufficient documentation

## 2012-06-25 DIAGNOSIS — S79919A Unspecified injury of unspecified hip, initial encounter: Secondary | ICD-10-CM | POA: Insufficient documentation

## 2012-06-25 DIAGNOSIS — Y9289 Other specified places as the place of occurrence of the external cause: Secondary | ICD-10-CM | POA: Insufficient documentation

## 2012-06-25 DIAGNOSIS — S0083XA Contusion of other part of head, initial encounter: Secondary | ICD-10-CM | POA: Diagnosis not present

## 2012-06-25 DIAGNOSIS — Y9389 Activity, other specified: Secondary | ICD-10-CM | POA: Insufficient documentation

## 2012-06-25 DIAGNOSIS — Z8639 Personal history of other endocrine, nutritional and metabolic disease: Secondary | ICD-10-CM | POA: Insufficient documentation

## 2012-06-25 DIAGNOSIS — S0990XA Unspecified injury of head, initial encounter: Secondary | ICD-10-CM | POA: Insufficient documentation

## 2012-06-25 DIAGNOSIS — M25559 Pain in unspecified hip: Secondary | ICD-10-CM | POA: Diagnosis not present

## 2012-06-25 DIAGNOSIS — M25539 Pain in unspecified wrist: Secondary | ICD-10-CM | POA: Diagnosis not present

## 2012-06-25 DIAGNOSIS — M25569 Pain in unspecified knee: Secondary | ICD-10-CM | POA: Diagnosis not present

## 2012-06-25 DIAGNOSIS — S59909A Unspecified injury of unspecified elbow, initial encounter: Secondary | ICD-10-CM | POA: Diagnosis not present

## 2012-06-25 DIAGNOSIS — S79929A Unspecified injury of unspecified thigh, initial encounter: Secondary | ICD-10-CM | POA: Diagnosis not present

## 2012-06-25 LAB — POCT I-STAT, CHEM 8
BUN: 15 mg/dL (ref 6–23)
Calcium, Ion: 1.19 mmol/L (ref 1.13–1.30)
Chloride: 106 mEq/L (ref 96–112)
Creatinine, Ser: 0.6 mg/dL (ref 0.50–1.10)
Glucose, Bld: 107 mg/dL — ABNORMAL HIGH (ref 70–99)

## 2012-06-25 LAB — CBC WITH DIFFERENTIAL/PLATELET
Basophils Absolute: 0 10*3/uL (ref 0.0–0.1)
Eosinophils Relative: 2 % (ref 0–5)
HCT: 39.4 % (ref 36.0–46.0)
Hemoglobin: 14.2 g/dL (ref 12.0–15.0)
Lymphocytes Relative: 23 % (ref 12–46)
Lymphs Abs: 1.6 10*3/uL (ref 0.7–4.0)
MCV: 87.4 fL (ref 78.0–100.0)
Monocytes Absolute: 0.4 10*3/uL (ref 0.1–1.0)
Monocytes Relative: 6 % (ref 3–12)
Neutro Abs: 4.9 10*3/uL (ref 1.7–7.7)
WBC: 7.1 10*3/uL (ref 4.0–10.5)

## 2012-06-25 LAB — GLUCOSE, CAPILLARY: Glucose-Capillary: 130 mg/dL — ABNORMAL HIGH (ref 70–99)

## 2012-06-25 MED ORDER — FENTANYL CITRATE 0.05 MG/ML IJ SOLN
25.0000 ug | Freq: Once | INTRAMUSCULAR | Status: DC
  Filled 2012-06-25: qty 2

## 2012-06-25 NOTE — ED Notes (Signed)
Reports having broken bones in the left leg but is not a candidate for surgery, she is able to ambulate with a knee brace. Brace is off at this time.

## 2012-06-25 NOTE — ED Provider Notes (Signed)
I saw and evaluated the patient, reviewed the resident's note and I agree with the findings and plan.  Pt awake and alert, no midline cspine pain, pt comfortable without complaints of pain.  Xrays and CT scans reassuring.  Discharged with strict return precautions.  Pt agreeable with plan.  Ethelda Chick, MD 06/25/12 [phone]

## 2012-06-25 NOTE — ED Notes (Signed)
Pt has bruising on left hip and c/o left wrist pain.

## 2012-06-25 NOTE — ED Notes (Signed)
Pt was trying to get out of a chair, became unsteady and fell to the ground, taking the impact with her L side.  No loc or anticoagulants.  Pt c/o pain to L forehead (bruising and 1x1 inch hematoma, L hand pain and L hip pain (L leg with multiple broken bones already to L leg).

## 2012-06-25 NOTE — ED Provider Notes (Signed)
History     CSN: [id number]  Arrival date & time 06/25/12  1814   First MD Initiated Contact with Patient 06/25/12 1951     Chief Complaint  Patient presents with  . Fall    pain to head, hand and hip   HPI  79 y/o female who presents from home after fall. The patient states that she was attempting to get out of a chair when she lost her balance falling on her left side. She states she hit her head but doesn't believe she lost consciosness. She is complaining of pain on the left side of her head, left wrist, left hip and left knee. She states her pain is currently a 4/10. She states movement makes her pain worse.   Past Medical History  Diagnosis Date  . Breast cancer   . CHF (congestive heart failure)   . Hypoglycemia   . Hard of hearing   . Phlebitis   . Allergy   . Phlebitis     l leg    Past Surgical History  Procedure Laterality Date  . Abdominal hysterectomy    . Bladder suspension    . Breast lumpectomy    . Foot surgery      left foot  . Inner ear surgery      Family History  Problem Relation Age of Onset  . Cerebral palsy Sister   . Alzheimer's disease Brother     History  Substance Use Topics  . Smoking status: Former Games developer  . Smokeless tobacco: Never Used  . Alcohol Use: No    OB History   Grav Para Term Preterm Abortions TAB SAB Ect Mult Living                  Review of Systems  Constitutional: Negative for fever and chills.  HENT: Negative for neck pain.   Respiratory: Negative for shortness of breath.   Cardiovascular: Negative for chest pain.  Musculoskeletal: Positive for arthralgias (left hip, left knee).  Neurological: Positive for headaches. Negative for weakness and numbness.  All other systems reviewed and are negative.    Allergies  Contrast media  Home Medications   Current Outpatient Rx  Name  Route  Sig  Dispense  Refill  . aspirin 81 MG tablet   Oral   Take 81 mg by mouth daily.         Marland Kitchen diltiazem (CARDIZEM  CD) 120 MG 24 hr capsule   Oral   Take 120 mg by mouth daily.          . furosemide (LASIX) 40 MG tablet   Oral   Take 40 mg by mouth 2 (two) times daily.          Marland Kitchen KLOR-CON M20 20 MEQ tablet   Oral   Take 20 mEq by mouth 2 (two) times daily.          . ranitidine (ZANTAC) 150 MG tablet   Oral   Take 150 mg by mouth daily.         . TOPROL XL 50 MG 24 hr tablet   Oral   Take 75 mg by mouth daily.            BP 129/77  Pulse 64  Temp(Src) 98.1 F (36.7 C) (Oral)  Resp 18  SpO2 100%  Physical Exam  Nursing note and vitals reviewed. Constitutional: She is oriented to person, place, and time. She appears well-developed and well-nourished. No distress.  HENT:  Head: Normocephalic. Head is with contusion.    Mouth/Throat: No oropharyngeal exudate.  Eyes: Conjunctivae and EOM are normal. Pupils are equal, round, and reactive to light.  Neck: Normal range of motion. Neck supple.  Cardiovascular: Normal rate and regular rhythm.  Exam reveals no gallop and no friction rub.   No murmur heard. Pulses:      Radial pulses are 2+ on the right side, and 2+ on the left side.       Dorsalis pedis pulses are 1+ on the right side, and 1+ on the left side.       Posterior tibial pulses are 1+ on the right side, and 1+ on the left side.  Pulmonary/Chest: Effort normal and breath sounds normal.  Abdominal: Soft. She exhibits no distension. There is no tenderness.  Musculoskeletal: She exhibits edema (+2 LE edema bilaterally).       Left wrist: She exhibits tenderness and bony tenderness.       Left hip: She exhibits tenderness and bony tenderness.       Left knee: Tenderness found. Medial joint line and lateral joint line tenderness noted.       Cervical back: She exhibits no tenderness and no bony tenderness.       Thoracic back: She exhibits no tenderness and no bony tenderness.       Lumbar back: She exhibits no tenderness and no bony tenderness.       Legs: Ecchymosis  over lateral aspect of left thigh.  Neurological: She is alert and oriented to person, place, and time. She has normal strength. No sensory deficit. GCS eye subscore is 4. GCS verbal subscore is 5. GCS motor subscore is 6.  Skin: Skin is warm and dry.  Psychiatric: She has a normal mood and affect.    ED Course  Procedures (including critical care time)  Labs Reviewed  GLUCOSE, CAPILLARY - Abnormal; Notable for the following:    Glucose-Capillary 130 (*)    All other components within normal limits  CBC WITH DIFFERENTIAL - Abnormal; Notable for the following:    Platelets 131 (*)    All other components within normal limits  POCT I-STAT, CHEM 8 - Abnormal; Notable for the following:    Glucose, Bld 107 (*)    All other components within normal limits   Dg Pelvis 1-2 Views  06/25/2012  *RADIOLOGY REPORT*  Clinical Data: Left hip pain after fall down steps.  PELVIS - 1-2 VIEW  Comparison: 06/23/2010  Findings: As in the lower lumbar spine and hips.  The pelvic rim, SI joints, and symphysis pubis appear intact.  No displaced fractures identified.  No focal bone lesion or bone destruction. Calcified phleboliths in the pelvis.  Calcifications in the soft tissues over the left hip likely representing injection sites.  No significant change since previous study.  IMPRESSION: Degenerative changes in the lower lumbar spine and hips.  No displaced fractures identified.   Original Report Authenticated By: Burman Nieves, M.D.    Dg Wrist Complete Left  06/25/2012  *RADIOLOGY REPORT*  Clinical Data: Left wrist pain after fall down steps.  The  LEFT WRIST - COMPLETE 3+ VIEW  Comparison: None.  Findings: Diffuse bone demineralization.  Degenerative narrowing of the radial carpal and STT joints.  No evidence of acute fracture or subluxation.  No focal bone lesion or bone destruction.  Bone cortex and trabecular architecture appear intact.  IMPRESSION: Diffuse bone demineralization and degenerative changes.   No displaced fractures identified.  Original Report Authenticated By: Burman Nieves, M.D.    Ct Head Wo Contrast  06/25/2012  *RADIOLOGY REPORT*  Clinical Data:  Fall  CT HEAD WITHOUT CONTRAST CT CERVICAL SPINE WITHOUT CONTRAST  Technique:  Multidetector CT imaging of the head and cervical spine was performed following the standard protocol without intravenous contrast.  Multiplanar CT image reconstructions of the cervical spine were also generated.  Comparison:  October 27, 2010  CT HEAD  Findings: Global atrophy and chronic ischemic changes are stable. There is no mass effect, midline shift, or acute intracranial hemorrhage.  Mastoid air cells are clear.  Cranium is intact.  IMPRESSION: No acute intracranial pathology.  CT CERVICAL SPINE  Findings: No acute fracture and no dislocation.  Multilevel facet arthropathy throughout the cervical spine.  Severe narrowing at C5- 6 and C6-7.  No obvious soft tissue injury.  Normal appearing thyroid gland.  IMPRESSION: No acute bony injury in the cervical spine peri degenerative changes.   Original Report Authenticated By: Jolaine Click, M.D.    Ct Cervical Spine Wo Contrast  06/25/2012  *RADIOLOGY REPORT*  Clinical Data:  Fall  CT HEAD WITHOUT CONTRAST CT CERVICAL SPINE WITHOUT CONTRAST  Technique:  Multidetector CT imaging of the head and cervical spine was performed following the standard protocol without intravenous contrast.  Multiplanar CT image reconstructions of the cervical spine were also generated.  Comparison:  October 27, 2010  CT HEAD  Findings: Global atrophy and chronic ischemic changes are stable. There is no mass effect, midline shift, or acute intracranial hemorrhage.  Mastoid air cells are clear.  Cranium is intact.  IMPRESSION: No acute intracranial pathology.  CT CERVICAL SPINE  Findings: No acute fracture and no dislocation.  Multilevel facet arthropathy throughout the cervical spine.  Severe narrowing at C5- 6 and C6-7.  No obvious soft tissue  injury.  Normal appearing thyroid gland.  IMPRESSION: No acute bony injury in the cervical spine peri degenerative changes.   Original Report Authenticated By: Jolaine Click, M.D.    Dg Knee Complete 4 Views Left  06/25/2012  *RADIOLOGY REPORT*  Clinical Data: Left knee pain after fall down steps.  LEFT KNEE - COMPLETE 4+ VIEW  Comparison: 06/23/2010  Findings: Prominent degenerative changes in the left knee involving medial, lateral, patellofemoral compartments with tricompartmental narrowing and osteophytosis.  Changes are most prominent in the lateral compartment.  Subcortical cysts are present predominately in the medial tibial plateau.  Changes are stable since the previous study.  No evidence of acute fracture or subluxation.  No significant effusion.  No radiopaque soft tissue foreign bodies.  IMPRESSION: Diffuse degenerative changes in the left knee appears stable since previous study.  No acute bony abnormalities identified.   Original Report Authenticated By: Burman Nieves, M.D.      1. Fall, initial encounter   2. Head injury, initial encounter   3. Hematoma       MDM   79 y/o female who presents from home after mechanical fall who presents with a left sided facial contusion, left knee pain, and left hip pain. N/V intact.   Imaging without acute fracture. Labs unremarkable. Tylenol as needed for pain. Follow up with pcp as needed.          Shanon Ace, MD 06/25/12 [phone]

## 2012-07-26 DIAGNOSIS — M1710 Unilateral primary osteoarthritis, unspecified knee: Secondary | ICD-10-CM | POA: Diagnosis not present

## 2012-09-06 DIAGNOSIS — N3020 Other chronic cystitis without hematuria: Secondary | ICD-10-CM | POA: Diagnosis not present

## 2012-09-06 DIAGNOSIS — R3989 Other symptoms and signs involving the genitourinary system: Secondary | ICD-10-CM | POA: Diagnosis not present

## 2012-09-19 DIAGNOSIS — R609 Edema, unspecified: Secondary | ICD-10-CM | POA: Diagnosis not present

## 2012-09-19 DIAGNOSIS — C50919 Malignant neoplasm of unspecified site of unspecified female breast: Secondary | ICD-10-CM | POA: Diagnosis not present

## 2012-09-19 DIAGNOSIS — I4891 Unspecified atrial fibrillation: Secondary | ICD-10-CM | POA: Diagnosis not present

## 2012-09-19 DIAGNOSIS — I10 Essential (primary) hypertension: Secondary | ICD-10-CM | POA: Diagnosis not present

## 2012-09-20 DIAGNOSIS — N3020 Other chronic cystitis without hematuria: Secondary | ICD-10-CM | POA: Diagnosis not present

## 2012-10-24 DIAGNOSIS — H9190 Unspecified hearing loss, unspecified ear: Secondary | ICD-10-CM | POA: Diagnosis not present

## 2012-10-24 DIAGNOSIS — I10 Essential (primary) hypertension: Secondary | ICD-10-CM | POA: Diagnosis not present

## 2012-12-27 DIAGNOSIS — F411 Generalized anxiety disorder: Secondary | ICD-10-CM | POA: Diagnosis not present

## 2012-12-27 DIAGNOSIS — R5381 Other malaise: Secondary | ICD-10-CM | POA: Diagnosis not present

## 2012-12-27 DIAGNOSIS — M25569 Pain in unspecified knee: Secondary | ICD-10-CM | POA: Diagnosis not present

## 2013-01-03 DIAGNOSIS — M1710 Unilateral primary osteoarthritis, unspecified knee: Secondary | ICD-10-CM | POA: Diagnosis not present

## 2013-01-16 DIAGNOSIS — Z0000 Encounter for general adult medical examination without abnormal findings: Secondary | ICD-10-CM | POA: Diagnosis not present

## 2013-01-16 DIAGNOSIS — Z1331 Encounter for screening for depression: Secondary | ICD-10-CM | POA: Diagnosis not present

## 2013-01-16 DIAGNOSIS — L84 Corns and callosities: Secondary | ICD-10-CM | POA: Diagnosis not present

## 2013-01-16 DIAGNOSIS — C50919 Malignant neoplasm of unspecified site of unspecified female breast: Secondary | ICD-10-CM | POA: Diagnosis not present

## 2013-01-16 DIAGNOSIS — I509 Heart failure, unspecified: Secondary | ICD-10-CM | POA: Diagnosis not present

## 2013-01-16 DIAGNOSIS — M1990 Unspecified osteoarthritis, unspecified site: Secondary | ICD-10-CM | POA: Diagnosis not present

## 2013-01-16 DIAGNOSIS — I10 Essential (primary) hypertension: Secondary | ICD-10-CM | POA: Diagnosis not present

## 2013-01-16 DIAGNOSIS — Z23 Encounter for immunization: Secondary | ICD-10-CM | POA: Diagnosis not present

## 2013-01-23 DIAGNOSIS — I509 Heart failure, unspecified: Secondary | ICD-10-CM | POA: Diagnosis not present

## 2013-01-29 ENCOUNTER — Ambulatory Visit: Payer: Self-pay | Admitting: Podiatry

## 2013-02-06 DIAGNOSIS — H35039 Hypertensive retinopathy, unspecified eye: Secondary | ICD-10-CM | POA: Diagnosis not present

## 2013-02-07 DIAGNOSIS — R197 Diarrhea, unspecified: Secondary | ICD-10-CM | POA: Diagnosis not present

## 2013-02-13 ENCOUNTER — Ambulatory Visit: Payer: Self-pay | Admitting: Podiatry

## 2013-02-27 DIAGNOSIS — R197 Diarrhea, unspecified: Secondary | ICD-10-CM | POA: Diagnosis not present

## 2013-03-06 DIAGNOSIS — R197 Diarrhea, unspecified: Secondary | ICD-10-CM | POA: Diagnosis not present

## 2013-03-07 DIAGNOSIS — R197 Diarrhea, unspecified: Secondary | ICD-10-CM | POA: Diagnosis not present

## 2013-03-10 DIAGNOSIS — R197 Diarrhea, unspecified: Secondary | ICD-10-CM | POA: Diagnosis not present

## 2013-04-11 ENCOUNTER — Ambulatory Visit (HOSPITAL_COMMUNITY): Payer: Medicare Other

## 2013-04-12 NOTE — Progress Notes (Signed)
This encounter was created in error - please disregard.

## 2013-07-08 DIAGNOSIS — M1710 Unilateral primary osteoarthritis, unspecified knee: Secondary | ICD-10-CM | POA: Diagnosis not present

## 2013-08-15 DIAGNOSIS — H6120 Impacted cerumen, unspecified ear: Secondary | ICD-10-CM | POA: Diagnosis not present

## 2013-08-28 DIAGNOSIS — H905 Unspecified sensorineural hearing loss: Secondary | ICD-10-CM | POA: Diagnosis not present

## 2013-08-28 DIAGNOSIS — H903 Sensorineural hearing loss, bilateral: Secondary | ICD-10-CM | POA: Diagnosis not present

## 2013-08-28 DIAGNOSIS — H6120 Impacted cerumen, unspecified ear: Secondary | ICD-10-CM | POA: Diagnosis not present

## 2013-08-29 ENCOUNTER — Encounter: Payer: Self-pay | Admitting: Cardiovascular Disease

## 2013-08-29 DIAGNOSIS — I509 Heart failure, unspecified: Secondary | ICD-10-CM | POA: Diagnosis not present

## 2013-08-29 DIAGNOSIS — R002 Palpitations: Secondary | ICD-10-CM | POA: Diagnosis not present

## 2013-08-29 DIAGNOSIS — R609 Edema, unspecified: Secondary | ICD-10-CM | POA: Diagnosis not present

## 2013-09-04 ENCOUNTER — Encounter: Payer: Self-pay | Admitting: *Deleted

## 2013-09-05 ENCOUNTER — Encounter: Payer: Self-pay | Admitting: Cardiovascular Disease

## 2013-09-05 ENCOUNTER — Ambulatory Visit (INDEPENDENT_AMBULATORY_CARE_PROVIDER_SITE_OTHER): Payer: Medicare Other | Admitting: Cardiovascular Disease

## 2013-09-05 VITALS — BP 138/64 | HR 63 | Ht 59.0 in | Wt 129.0 lb

## 2013-09-05 DIAGNOSIS — I480 Paroxysmal atrial fibrillation: Secondary | ICD-10-CM | POA: Insufficient documentation

## 2013-09-05 DIAGNOSIS — I509 Heart failure, unspecified: Secondary | ICD-10-CM | POA: Diagnosis not present

## 2013-09-05 DIAGNOSIS — R609 Edema, unspecified: Secondary | ICD-10-CM | POA: Diagnosis not present

## 2013-09-05 DIAGNOSIS — IMO0002 Reserved for concepts with insufficient information to code with codable children: Secondary | ICD-10-CM | POA: Insufficient documentation

## 2013-09-05 DIAGNOSIS — R600 Localized edema: Secondary | ICD-10-CM

## 2013-09-05 DIAGNOSIS — I4891 Unspecified atrial fibrillation: Secondary | ICD-10-CM

## 2013-09-05 DIAGNOSIS — I5032 Chronic diastolic (congestive) heart failure: Secondary | ICD-10-CM | POA: Insufficient documentation

## 2013-09-05 NOTE — Patient Instructions (Addendum)
  I have recommended that you elevate your legs.  An ideal way is to get a [facility] Leg rest - invented by one of our local vascular surgeons.  Website:  [email or web address]  If you need further help , you may call  [phone]     Your physician has requested that you have an echocardiogram.  Echocardiography is a painless test that uses sound waves to create images of your heart. It provides your doctor with information about the size and shape of your heart and how well your heart's chambers and valves are working. This procedure takes approximately one hour. There are no restrictions for this procedure.  MEDICATION CHANGES:  STOP DILTIAZEM ( CARDIZEM)   Your physician recommends that you schedule a follow-up appointment in: [place] DR. Acie Fredrickson  You will need lab work today:  TSH We will call you with your results

## 2013-09-05 NOTE — Assessment & Plan Note (Addendum)
Alexis Winters carries The diagnosis of congestive heart failure. I do not have any specific details.  We will get an echo.  She is extremely frail. Her symptoms may be related to coronary artery disease but in her current condition she would be a very poor candidate for cardiac cath and subsequent intervention. Long talk with her and her daughter and her daughter understands this very well. I suspect that Alexis Winters has developed some mild dementia so it may be difficult for her to tell us of her symptoms but given her overall condition, I think that a conservative approach is warranted.  I'll see her back in the office in several months for followup visit.

## 2013-09-05 NOTE — Assessment & Plan Note (Addendum)
Alexis Winters presents with worsening leg edema. She's had leg edema for many years and has been on Lasix. Edema has been gradually worsening. She had a pro-BNP drawn at her medical doctor's office and it was only minimally elevated-218.  She has spells of severe shortness of breath. She has severe dyspnea on exertion but she actually appears to have more failure to thrive and congestive heart failure symptoms.  We'll get an echocardiogram to evaluate her left ventricle systolic function. She carries the diagnosis of congestive heart failure but do not have any further details of her CHF.  I've given her instructions on obtaining the [facility] leg rest.

## 2013-09-05 NOTE — Assessment & Plan Note (Signed)
Continue with aspirin. I do not think that she's a Coumadin candidate

## 2013-09-05 NOTE — Progress Notes (Signed)
Alexis Winters Date of Birth  1927-12-11       [facility] [address], [place], [place]  [address]   [place], [place]  [address] [place]   Fax  [phone]     Fax [phone]  Problem List: 1. Atrial fibrillation 2. Congestive heart failure 3. Cardiac cath with cardiac arrest  History of Present Illness:  [name] is a 80 yo with hx of CHF, palpitations ( lasts for a minute or so).  This past Friday, she was seen at her medical doctors office - tachycardia, fatigue, lots of peripheral edema.    She has these spells on occasion.    For the past 2 weeks she has had progressive "spells" with severe dyspnea, profound fatigue,  Some mild , vague chest tightness, tachypalpitations.  She also has some left hand tingling.    Current Outpatient Prescriptions on File Prior to Visit  Medication Sig Dispense Refill  . aspirin 81 MG tablet Take 81 mg by mouth daily.      Marland Kitchen diltiazem (CARDIZEM CD) 120 MG 24 hr capsule Take 120 mg by mouth daily.       . diphenoxylate-atropine (LOMOTIL) 2.5-0.025 MG per tablet Take by mouth as needed for diarrhea or loose stools.      . docusate sodium (COLACE) 100 MG capsule Take 100 mg by mouth as needed.       . furosemide (LASIX) 40 MG tablet Take 40 mg by mouth 2 (two) times daily.       Marland Kitchen KLOR-CON M20 20 MEQ tablet Take 20 mEq by mouth 2 (two) times daily.       . Methen-Hyosc-Meth Blue-Na Phos (UROGESIC-BLUE) 81.6 MG TABS Take by mouth as needed.      . Multiple Vitamin (MULTIVITAMIN) tablet Take 1 tablet by mouth daily.      . TOPROL XL 50 MG 24 hr tablet Take 75 mg by mouth daily.       . vitamin B-12 (CYANOCOBALAMIN) 1000 MCG tablet Take 1,000 mcg by mouth daily.       No current facility-administered medications on file prior to visit.    Allergies  Allergen Reactions  . Contrast Media [Iodinated Diagnostic Agents]   . Pyridium  [Phenazopyridine Hcl]     Past Medical History  Diagnosis Date  . CHF (congestive heart failure)   . Hypoglycemia   . Hard of hearing   . Phlebitis   . Allergy   . Phlebitis     l leg  . Hypertension   . Frequent UTI   . Enlarged liver   . Hemorrhoids   . Dyslipidemia   . OA (osteoarthritis)     hip and back  . Atrial fibrillation   . Allergic rhinitis   . GERD (gastroesophageal reflux disease)   . Prediabetes   . Breast cancer   . Arrhythmia     Past Surgical History  Procedure Laterality Date  . Abdominal hysterectomy    . Bladder suspension    . Breast lumpectomy    . Foot surgery      left foot  . Inner ear surgery    . Cardiac catheterization  1980's    [facility]    History  Smoking status  . Former Smoker  Smokeless tobacco  . Never Used    History  Alcohol Use No    Family  History  Problem Relation Age of Onset  . Cerebral palsy Sister   . Alzheimer's disease Brother   . Hypertension Father   . Heart disease Brother     Reviw of Systems:  Reviewed in the HPI.  All other systems are negative.  Physical Exam: Blood pressure 138/64, pulse 63, height 4\' 11"  (1.499 m), weight 129 lb (58.514 kg). Wt Readings from Last 3 Encounters:  09/05/13 129 lb (58.514 kg)  04/12/12 126 lb 12.8 oz (57.516 kg)     General: Frail, elderly white female. She is hard of hearing. She seems to be easily distracted. I suspect that she has mild dementia.  Head: Normocephalic, atraumatic, sclera non-icteric, mucus membranes are moist,   Neck: Supple. Carotids are 2 + without bruits. No JVD   Lungs: Clear   Heart:  Regular rate, S1-S2.  Abdomen: Soft, non-tender, non-distended with normal bowel sounds.  Msk:  Strength and tone are normal   Extremities: No clubbing or cyanosis. 2+ pitting edema.  The legs appear to be chronically edematous.  Distal pedal pulses are 2+ and equal  She's quite frail. She walks very slowly and seemed to need a lot of assistance. We  had to assist her  getting up and down off the exam table.  Neuro: CN II - XII intact.  Except she is partially deaf.  Alert and oriented X 3.   Psych:   She seems to have at least mild dementia. Her hearing impairment makes assessment of this difficulty.  ECG: 09/05/2012: Normal sinus rhythm at 63. She has no ST or T wave changes.  Assessment / Plan:

## 2013-09-05 NOTE — Assessment & Plan Note (Signed)
Her symptoms are somewhat vague. She has a history of congestive heart failure but her symptoms really do not sound like congestive heart therapy just feels of weakness and some tachycardia. I suspect that she has failure to thrive.  We'll check a TSH today.

## 2013-09-11 ENCOUNTER — Encounter (HOSPITAL_COMMUNITY): Payer: Self-pay | Admitting: Emergency Medicine

## 2013-09-11 ENCOUNTER — Emergency Department (HOSPITAL_COMMUNITY): Payer: Medicare Other

## 2013-09-11 ENCOUNTER — Observation Stay (HOSPITAL_COMMUNITY)
Admission: EM | Admit: 2013-09-11 | Discharge: 2013-09-12 | Disposition: A | Discharge status: Home / Self Care | Payer: Medicare Other | Attending: Internal Medicine | Admitting: Internal Medicine

## 2013-09-11 DIAGNOSIS — Z9010 Acquired absence of unspecified breast and nipple: Secondary | ICD-10-CM | POA: Diagnosis not present

## 2013-09-11 DIAGNOSIS — I509 Heart failure, unspecified: Secondary | ICD-10-CM | POA: Insufficient documentation

## 2013-09-11 DIAGNOSIS — G3184 Mild cognitive impairment, so stated: Secondary | ICD-10-CM | POA: Insufficient documentation

## 2013-09-11 DIAGNOSIS — H9190 Unspecified hearing loss, unspecified ear: Secondary | ICD-10-CM | POA: Insufficient documentation

## 2013-09-11 DIAGNOSIS — I10 Essential (primary) hypertension: Secondary | ICD-10-CM | POA: Diagnosis not present

## 2013-09-11 DIAGNOSIS — I209 Angina pectoris, unspecified: Secondary | ICD-10-CM | POA: Insufficient documentation

## 2013-09-11 DIAGNOSIS — Z87891 Personal history of nicotine dependence: Secondary | ICD-10-CM | POA: Insufficient documentation

## 2013-09-11 DIAGNOSIS — R5383 Other fatigue: Secondary | ICD-10-CM

## 2013-09-11 DIAGNOSIS — R0602 Shortness of breath: Secondary | ICD-10-CM | POA: Diagnosis not present

## 2013-09-11 DIAGNOSIS — Z853 Personal history of malignant neoplasm of breast: Secondary | ICD-10-CM | POA: Insufficient documentation

## 2013-09-11 DIAGNOSIS — M25559 Pain in unspecified hip: Secondary | ICD-10-CM | POA: Diagnosis not present

## 2013-09-11 DIAGNOSIS — E785 Hyperlipidemia, unspecified: Secondary | ICD-10-CM | POA: Diagnosis not present

## 2013-09-11 DIAGNOSIS — R079 Chest pain, unspecified: Secondary | ICD-10-CM | POA: Diagnosis not present

## 2013-09-11 DIAGNOSIS — R55 Syncope and collapse: Secondary | ICD-10-CM | POA: Diagnosis not present

## 2013-09-11 DIAGNOSIS — I208 Other forms of angina pectoris: Secondary | ICD-10-CM | POA: Diagnosis present

## 2013-09-11 DIAGNOSIS — K219 Gastro-esophageal reflux disease without esophagitis: Secondary | ICD-10-CM | POA: Diagnosis not present

## 2013-09-11 DIAGNOSIS — R002 Palpitations: Secondary | ICD-10-CM | POA: Insufficient documentation

## 2013-09-11 DIAGNOSIS — E119 Type 2 diabetes mellitus without complications: Secondary | ICD-10-CM | POA: Insufficient documentation

## 2013-09-11 DIAGNOSIS — R600 Localized edema: Secondary | ICD-10-CM | POA: Diagnosis present

## 2013-09-11 DIAGNOSIS — J9819 Other pulmonary collapse: Secondary | ICD-10-CM | POA: Insufficient documentation

## 2013-09-11 DIAGNOSIS — R5381 Other malaise: Secondary | ICD-10-CM | POA: Insufficient documentation

## 2013-09-11 DIAGNOSIS — I2089 Other forms of angina pectoris: Secondary | ICD-10-CM | POA: Diagnosis present

## 2013-09-11 DIAGNOSIS — R609 Edema, unspecified: Secondary | ICD-10-CM | POA: Diagnosis not present

## 2013-09-11 DIAGNOSIS — I4891 Unspecified atrial fibrillation: Secondary | ICD-10-CM | POA: Insufficient documentation

## 2013-09-11 DIAGNOSIS — I5032 Chronic diastolic (congestive) heart failure: Secondary | ICD-10-CM | POA: Insufficient documentation

## 2013-09-11 DIAGNOSIS — Z7982 Long term (current) use of aspirin: Secondary | ICD-10-CM | POA: Insufficient documentation

## 2013-09-11 DIAGNOSIS — M542 Cervicalgia: Principal | ICD-10-CM | POA: Insufficient documentation

## 2013-09-11 DIAGNOSIS — I079 Rheumatic tricuspid valve disease, unspecified: Secondary | ICD-10-CM | POA: Diagnosis not present

## 2013-09-11 HISTORY — DX: Nonrheumatic mitral (valve) insufficiency: I34.0

## 2013-09-11 HISTORY — DX: Chronic diastolic (congestive) heart failure: I50.32

## 2013-09-11 LAB — I-STAT CHEM 8, ED
BUN: 18 mg/dL (ref 6–23)
CREATININE: 0.8 mg/dL (ref 0.50–1.10)
Calcium, Ion: 1.18 mmol/L (ref 1.13–1.30)
Chloride: 102 mEq/L (ref 96–112)
Glucose, Bld: 143 mg/dL — ABNORMAL HIGH (ref 70–99)
HCT: 47 % — ABNORMAL HIGH (ref 36.0–46.0)
Hemoglobin: 16 g/dL — ABNORMAL HIGH (ref 12.0–15.0)
POTASSIUM: 3.9 meq/L (ref 3.7–5.3)
Sodium: 144 mEq/L (ref 137–147)
TCO2: 30 mmol/L (ref 0–100)

## 2013-09-11 LAB — CBC WITH DIFFERENTIAL/PLATELET
Basophils Absolute: 0 10*3/uL (ref 0.0–0.1)
Basophils Relative: 0 % (ref 0–1)
EOS PCT: 1 % (ref 0–5)
Eosinophils Absolute: 0.1 10*3/uL (ref 0.0–0.7)
HCT: 44.5 % (ref 36.0–46.0)
HEMOGLOBIN: 15.4 g/dL — AB (ref 12.0–15.0)
LYMPHS ABS: 1.1 10*3/uL (ref 0.7–4.0)
Lymphocytes Relative: 19 % (ref 12–46)
MCH: 32.1 pg (ref 26.0–34.0)
MCHC: 34.6 g/dL (ref 30.0–36.0)
MCV: 92.7 fL (ref 78.0–100.0)
MONOS PCT: 8 % (ref 3–12)
Monocytes Absolute: 0.4 10*3/uL (ref 0.1–1.0)
NEUTROS PCT: 72 % (ref 43–77)
Neutro Abs: 4.2 10*3/uL (ref 1.7–7.7)
Platelets: 149 10*3/uL — ABNORMAL LOW (ref 150–400)
RBC: 4.8 MIL/uL (ref 3.87–5.11)
RDW: 13.3 % (ref 11.5–15.5)
WBC: 5.8 10*3/uL (ref 4.0–10.5)

## 2013-09-11 LAB — CBC
HEMATOCRIT: 42.3 % (ref 36.0–46.0)
Hemoglobin: 14.2 g/dL (ref 12.0–15.0)
MCH: 31.6 pg (ref 26.0–34.0)
MCHC: 33.6 g/dL (ref 30.0–36.0)
MCV: 94.2 fL (ref 78.0–100.0)
PLATELETS: 132 10*3/uL — AB (ref 150–400)
RBC: 4.49 MIL/uL (ref 3.87–5.11)
RDW: 13.3 % (ref 11.5–15.5)
WBC: 5.1 10*3/uL (ref 4.0–10.5)

## 2013-09-11 LAB — CREATININE, SERUM
Creatinine, Ser: 0.71 mg/dL (ref 0.50–1.10)
GFR, EST AFRICAN AMERICAN: 89 mL/min — AB (ref >90)
GFR, EST NON AFRICAN AMERICAN: 77 mL/min — AB (ref >90)

## 2013-09-11 LAB — PRO B NATRIURETIC PEPTIDE: PRO B NATRI PEPTIDE: 534.9 pg/mL — AB (ref 0–450)

## 2013-09-11 LAB — I-STAT TROPONIN, ED: Troponin i, poc: 0.01 ng/mL (ref 0.00–0.08)

## 2013-09-11 LAB — TROPONIN I: Troponin I: <0.3 ng/mL (ref <0.30)

## 2013-09-11 MED ORDER — HEPARIN SODIUM (PORCINE) 5000 UNIT/ML IJ SOLN
5000.0000 [IU] | Freq: Three times a day (TID) | INTRAMUSCULAR | Status: DC
  Administered 2013-09-11 – 2013-09-12 (×3): 5000 [IU] via SUBCUTANEOUS
  Filled 2013-09-11 (×4): qty 1

## 2013-09-11 MED ORDER — ASPIRIN 300 MG RE SUPP
300.0000 mg | RECTAL | Status: DC
  Filled 2013-09-11: qty 1

## 2013-09-11 MED ORDER — ASPIRIN 81 MG PO CHEW
324.0000 mg | CHEWABLE_TABLET | Freq: Once | ORAL | Status: AC
  Administered 2013-09-11: 324 mg via ORAL
  Filled 2013-09-11: qty 4

## 2013-09-11 MED ORDER — METOPROLOL SUCCINATE ER 50 MG PO TB24
75.0000 mg | ORAL_TABLET | Freq: Every day | ORAL | Status: DC
  Administered 2013-09-11 – 2013-09-12 (×2): 75 mg via ORAL
  Filled 2013-09-11 (×2): qty 1

## 2013-09-11 MED ORDER — POTASSIUM CHLORIDE CRYS ER 20 MEQ PO TBCR
20.0000 meq | EXTENDED_RELEASE_TABLET | Freq: Two times a day (BID) | ORAL | Status: DC
  Administered 2013-09-11 – 2013-09-12 (×2): 20 meq via ORAL
  Filled 2013-09-11 (×4): qty 1

## 2013-09-11 MED ORDER — NITROGLYCERIN 0.4 MG SL SUBL: 0.4000 mg | SUBLINGUAL_TABLET | SUBLINGUAL | Status: DC | PRN

## 2013-09-11 MED ORDER — VITAMIN B-12 1000 MCG PO TABS
1000.0000 ug | ORAL_TABLET | Freq: Every day | ORAL | Status: DC
  Administered 2013-09-11 – 2013-09-12 (×2): 1000 ug via ORAL
  Filled 2013-09-11 (×2): qty 1

## 2013-09-11 MED ORDER — DOCUSATE SODIUM 100 MG PO CAPS
100.0000 mg | ORAL_CAPSULE | Freq: Every day | ORAL | Status: DC | PRN
  Filled 2013-09-11: qty 1

## 2013-09-11 MED ORDER — ASPIRIN 81 MG PO CHEW
324.0000 mg | CHEWABLE_TABLET | ORAL | Status: DC
  Filled 2013-09-11: qty 4

## 2013-09-11 MED ORDER — ASPIRIN EC 81 MG PO TBEC: 81.0000 mg | DELAYED_RELEASE_TABLET | Freq: Every day | ORAL | Status: DC

## 2013-09-11 MED ORDER — FUROSEMIDE 40 MG PO TABS
40.0000 mg | ORAL_TABLET | Freq: Two times a day (BID) | ORAL | Status: DC
  Administered 2013-09-11 – 2013-09-12 (×2): 40 mg via ORAL
  Filled 2013-09-11 (×4): qty 1

## 2013-09-11 MED ORDER — ASPIRIN EC 81 MG PO TBEC
81.0000 mg | DELAYED_RELEASE_TABLET | Freq: Every day | ORAL | Status: DC
  Administered 2013-09-12: 81 mg via ORAL
  Filled 2013-09-11: qty 1

## 2013-09-11 NOTE — ED Notes (Signed)
Attempted to call report.  RN Nellie in pt room.  Will call shortly.

## 2013-09-11 NOTE — ED Provider Notes (Signed)
CSN: [id number]     Arrival date & time 09/11/13  1318 History   First MD Initiated Contact with Patient 09/11/13 1335     Chief Complaint  Patient presents with  . Chest Pain  . Shortness of Breath     (Consider location/radiation/quality/duration/timing/severity/associated sxs/prior Treatment) HPI  80 year old female who is hard of hearing, with history of CHF, atrial fibrillation currently not on anticoagulants, history of breast cancer status post lumpectomy, and history of GERD who was brought in via EMS for evaluation of neck discomfort. Patient report about an hour ago she was eating when she experiencing sensation of neck tightness, difficulty breathing, having heart palpitation and a squeezing sensation to the neck. She felt dizzy and lightheadedness transiently which has improved. No associated fever, chills, headache, chest pain, productive cough, hemoptysis, abdominal pain, back pain, numbness or weakness. Report having a similar symptoms 40 years ago and was thought to be related to heart palpitation. She has bilateral lower extremity edema but this is normal for her. She was recently seen by a new cardiologist for management of her CHF and atrial fibrillation along with lower extremities edema. She reports been taken off from her Cardizem, and continues on toprol.  No active chest pain at this time. No specific treatment tried.    Past Medical History  Diagnosis Date  . CHF (congestive heart failure)   . Hypoglycemia   . Hard of hearing   . Phlebitis   . Allergy   . Phlebitis     l leg  . Hypertension   . Frequent UTI   . Enlarged liver   . Hemorrhoids   . Dyslipidemia   . OA (osteoarthritis)     hip and back  . Atrial fibrillation   . Allergic rhinitis   . GERD (gastroesophageal reflux disease)   . Prediabetes   . Arrhythmia   . Breast cancer    Past Surgical History  Procedure Laterality Date  . Abdominal hysterectomy    . Bladder suspension    . Breast  lumpectomy    . Foot surgery      left foot  . Inner ear surgery    . Cardiac catheterization  50's    [facility]   Family History  Problem Relation Age of Onset  . Cerebral palsy Sister   . Alzheimer's disease Brother   . Hypertension Father   . Heart disease Brother    History  Substance Use Topics  . Smoking status: Former Research scientist (life sciences)  . Smokeless tobacco: Never Used  . Alcohol Use: No   OB History   Grav Para Term Preterm Abortions TAB SAB Ect Mult Living                 Review of Systems  All other systems reviewed and are negative.     Allergies  Contrast media and Pyridium  Home Medications   Prior to Admission medications   Medication Sig Start Date End Date Taking? Authorizing Provider  aspirin 81 MG tablet Take 81 mg by mouth daily.    Historical Provider, MD  diphenoxylate-atropine (LOMOTIL) 2.5-0.025 MG per tablet Take by mouth as needed for diarrhea or loose stools.    Historical Provider, MD  docusate sodium (COLACE) 100 MG capsule Take 100 mg by mouth as needed.     Historical Provider, MD  furosemide (LASIX) 40 MG tablet Take 40 mg by mouth 2 (two) times daily.  04/09/12   Historical Provider, MD  KLOR-CON M20 20 MEQ  tablet Take 20 mEq by mouth 2 (two) times daily.  03/23/12   Historical Provider, MD  Methen-Hyosc-Meth Blue-Na Phos (UROGESIC-BLUE) 81.6 MG TABS Take by mouth as needed.    Historical Provider, MD  Multiple Vitamin (MULTIVITAMIN) tablet Take 1 tablet by mouth daily.    Historical Provider, MD  TOPROL XL 50 MG 24 hr tablet Take 75 mg by mouth daily.  01/29/12   Historical Provider, MD  vitamin B-12 (CYANOCOBALAMIN) 1000 MCG tablet Take 1,000 mcg by mouth daily.    Historical Provider, MD   BP 139/52  Pulse 80  Temp(Src) 98.5 F (36.9 C) (Oral)  Resp 25  SpO2 98% Physical Exam  Nursing note and vitals reviewed. Constitutional: She appears well-developed and well-nourished. No distress.  HENT:  Head: Atraumatic.  Eyes: Conjunctivae are normal.   Neck: Normal range of motion. Neck supple. No JVD present. No tracheal deviation present. No thyromegaly present.  Cardiovascular:  Normal heart sounds without murmur rubs or gallops  Pulmonary/Chest: Effort normal and breath sounds normal. No stridor. No respiratory distress. She has no wheezes. She has no rales.  Abdominal: Soft. There is no tenderness.  Musculoskeletal: She exhibits edema (2+ pitting edema to bilateral loer extremities with normal skin tone and palpable pedal pulses).  Lymphadenopathy:    She has no cervical adenopathy.  Neurological: She is alert.  Skin: No rash noted.  Psychiatric: She has a normal mood and affect.    ED Course  Procedures (including critical care time)  2:09 PM Patient here with complaint of heart palpitation, and neck tightness. She is currently back to normal baseline without any active complaints. Due to the long list of medical history, workup initiated. Pt will likely need admission for cardiac r/o.  Care discussed with Dr. Leonides Schanz.  Will also give SL nitro and 324mg  of ASA.    3:11 PM I read Dr. Elmarie Shiley note.  Pt was seen by him 6 days ago.  She has similar sxs as today, which were thought to be related to afib, chf and pt is a poor candidate for cardiac cath.  She has prior hx of cardiac cath with cardiac arrest.  Dr. Acie Fredrickson mentioned that pt may not benefit much for further management, but will obtain cardiac echo.  Family member felt the would like further care for pt.  I have consulted with Cardiology in regard to her presence in the ER and will also consult medicine for admission for cardiac r/o.  Pt and family member agrees with plan.     3:54 PM CXR with subsegmental atelectasis or early interstitial pna in the lower lobes.  No evidence of CHF.  However, given that pt does not have fever and does not endorse a cough, i have low suspicion for pna.    4:16 PM I have consulted with [facility] Dr. Francene Boyers who agrees to admit pt to obs,  tele, team 10, under her care.    Labs Review Labs Reviewed  CBC WITH DIFFERENTIAL - Abnormal; Notable for the following:    Hemoglobin 15.4 (*)    Platelets 149 (*)    All other components within normal limits  PRO B NATRIURETIC PEPTIDE - Abnormal; Notable for the following:    Pro B Natriuretic peptide (BNP) 534.9 (*)    All other components within normal limits  I-STAT CHEM 8, ED - Abnormal; Notable for the following:    Glucose, Bld 143 (*)    Hemoglobin 16.0 (*)    HCT 47.0 (*)  All other components within normal limits  I-STAT TROPOININ, ED    Imaging Review Dg Chest 2 View  09/11/2013   CLINICAL DATA:  Chest pain and shortness of breath with history of CHF  EXAM: CHEST  2 VIEW  COMPARISON:  PA and lateral chest of May 17, 2009 and a right rib series of January 26, 2011  FINDINGS: The lungs are adequately inflated and exhibit mildly increased density in the lower lobes. There is an azygos lobe type anatomy peer The cardiac silhouette is top-normal in size. The pulmonary vascularity is not engorged. There is tortuosity of the descending thoracic aorta. There is no pleural effusion. There are degenerative changes of the right shoulder.  IMPRESSION: There is subsegmental atelectasis or early interstitial pneumonia in the lower lobes. There is no evidence of CHF.   Electronically Signed   By: David  Martinique   On: 09/11/2013 15:09     EKG Interpretation   Date/Time:  Thursday September 11 2013 13:44:01 EDT Ventricular Rate:  74 PR Interval:  58 QRS Duration: 75 QT Interval:  425 QTC Calculation: 471 R Axis:   -10 Text Interpretation:  Sinus rhythm Short PR interval Abnormal R-wave  progression, early transition Left ventricular hypertrophy Artifact  Confirmed by WARD,  DO, KRISTEN ([id number]) on 09/11/2013 2:31:55 PM      MDM   Final diagnoses:  Shortness of breath    BP 125/61  Pulse 66  Temp(Src) 98.5 F (36.9 C) (Oral)  Resp 20  SpO2 97%  I have reviewed nursing  notes and vital signs. I personally reviewed the imaging tests through PACS system  I reviewed available ER/hospitalization records thought the EMR     Domenic Moras, [place] 09/11/13 1619

## 2013-09-11 NOTE — ED Notes (Signed)
Pt to ED via [facility] for sob, neck tightness and chest pain.  Tx by Dr Acie Fredrickson of [facility] for afib and chf.  VS stable, NSR with pvc's.  Pt does have bil LE edema, which, per pt, is her norm.

## 2013-09-11 NOTE — H&P (Signed)
This is an 80 year old female who has obvious memory/cognitive impairment who is in the ER after 911 was called by her daughter. The patient called her daughter when she suddenly developed a tightness in her chest and felt as though her heart were racing. She has had these episodes intermittently over many years. They have become more frequent recently. When they occur she feels as though she may blackout/her vision becomes day. She has to sit down. Between episodes she feels fine. She is unable to give a very coherent history and has difficulty recalling the indication for prior cardiac evaluations which have included a coronary angiogram in 1984 (associated with presumed ventricular fibrillation since she required defibrillation) and 2004 when she underwent a stress test. She is currently completely pain free. Her exam and other data on unremarkable.  We have decided to put the patient in the hospital, monitor for evidence of arrhythmia, and to track serial cardiac markers and EKGs to rule out ischemia/injury. Further testing should depend upon the findings obtained with this data base.  I suspect she is having paroxysmal atrial or ventricular arrhythmias and the self terminating . If she rules out I think it would be appropriate to perform continuous ambulatory monitoring as an outpatient. We should perform an echocardiogram while she is in the hospital.

## 2013-09-11 NOTE — ED Notes (Signed)
Pt rt from xray placed back on monitor

## 2013-09-11 NOTE — ED Provider Notes (Signed)
Medical screening examination/treatment/procedure(s) were conducted as a shared visit with non-physician practitioner(s) and myself.  I personally evaluated the patient during the encounter.   EKG Interpretation   Date/Time:  Thursday September 11 2013 13:44:01 EDT Ventricular Rate:  74 PR Interval:  58 QRS Duration: 75 QT Interval:  425 QTC Calculation: 471 R Axis:   -10 Text Interpretation:  Sinus rhythm Short PR interval Abnormal R-wave  progression, early transition Left ventricular hypertrophy Artifact  Confirmed by WARD,  DO, KRISTEN ([id number]) on 09/11/2013 2:31:55 PM      Pt is a 80 y.o. F with history of CHF, dyslipidemia, diabetes who presents emergency department with intermittent episodes of episodes of near syncope and throat tightness for the past 2 weeks. Today she developed chest pain that radiates into her left arm and shortness of breath. Troponin negative. Chest x-ray shows possible interstitial pneumonia she has no leukocytosis, cough or fever. I feel this likely. We'll admit for ACS rule out.  [place], DO 09/11/13 1525

## 2013-09-11 NOTE — Progress Notes (Signed)
Pt arrived to floor from ED via stretcher A&0 x3.  Oriented to room .  Call bell at reach  And instructed to call for assistance as needed .  Verbalized understanding.  Will continue to monitor.  Karie Kirks, Therapist, sports.

## 2013-09-11 NOTE — H&P (Signed)
Reason for Consult: Chest Pain  Referring Physician: [facility]  Primary Cardiologist: Dr. Acie Fredrickson (formerly Dr. Rollene Fare)    HPI: The patient is a 80 y/o WF, formerly followed by Dr. Rollene Fare. She was evaluated by Dr. Acie Fredrickson, for the first time, last week on 09/05/13. Unfortunately, the patient is a bit of a poor historian, so much of the history is limited. There is speculation that she may have developed mild dementia. From what I can gather from her records, she has a history of atrial fibrillation (no longer on Coumadin) and CHF. Apparently, she had a heart catheterization in 1982 that reportedly was a normal study but according to the family, she arrested during the procedure and required defibrillation. She also has a h/o left breast cancer, s/p lumpectomy in 1991.    At the time of her office visit with Dr. Acie Fredrickson on 09/05/13, she complained of worsening dyspnea, profound fatigue and some mild, vague chest tightness and tachypalpitations. Her EKG at that time demonstrated NSR. She had no ST or T wave changes. Dr. Acie Fredrickson ordered for her to have a 2D echocardiogram (scheduled for 7/2) and also ordered a TSH. This was not done. Her daughter states she was a difficult stick and phlebotomist was unable to collect sample.    She presented to the [facility] ER today via EMS with a complaint of sudden onset bilateral neck tightness/squeezing that occurred around 11:30 am today after eating lunch. She denied radiation of discomfort to her chest. She had mild palpitations. She also notes that her vision went "black". She felt dizzy, but denies syncope. No diaphoresis, nausea or vomiting. The episode lasted roughly 30 minutes before resolving spontaneously. She states that she has had several episodes of palpitations in the past but denied prior episodes, like she experienced today. She states that she lives at home by herself. Her daughter lives nearby, but she performs all of her ADLs, including cooking and household  chores independently. She denies any significant limitations with these activities. She never has exertional chest discomfort or dyspnea, however she frequently feels fatigued. She notes chronic bilateral LEE, but states her swelling has increased over the last several days. She reports full medication compliance.    W/u in the ER reveals a mildly elevated BNP at 534.9. POC troponin is negative. CXR shows subsegmental atelectasis or early interstitial pneumonia in the lower lobes, but there is no evidence of CHF.    Past Medical History   Diagnosis  Date   .  CHF (congestive heart failure)    .  Hypoglycemia    .  Hard of hearing    .  Phlebitis    .  Allergy    .  Phlebitis      l leg   .  Hypertension    .  Frequent UTI    .  Enlarged liver    .  Hemorrhoids    .  Dyslipidemia    .  OA (osteoarthritis)      hip and back   .  Atrial fibrillation    .  Allergic rhinitis    .  GERD (gastroesophageal reflux disease)    .  Prediabetes    .  Arrhythmia    .  Breast cancer     Past Surgical History   Procedure  Laterality  Date   .  Abdominal hysterectomy     .  Bladder suspension     .  Breast lumpectomy     .  Foot surgery       left foot   .  Inner ear surgery     .  Cardiac catheterization   37's     [facility]    Family History   Problem  Relation  Age of Onset   .  Cerebral palsy  Sister    .  Alzheimer's disease  Brother    .  Hypertension  Father    .  Heart disease  Brother     Social History: reports that she has quit smoking. She has never used smokeless tobacco. She reports that she does not drink alcohol or use illicit drugs.   Allergies:  Allergies   Allergen  Reactions   .  Contrast Media [Iodinated Diagnostic Agents]  Other (See Comments)     unknown   .  Pyridium [Phenazopyridine Hcl]  Other (See Comments)     unknown    Medications:  Prior to Admission medications   Medication  Sig  Start Date  End Date  Taking?  Authorizing Provider   aspirin EC 81  MG tablet  Take 81 mg by mouth daily.    Yes  Historical Provider, MD   diphenoxylate-atropine (LOMOTIL) 2.5-0.025 MG per tablet  Take 1 tablet by mouth daily as needed for diarrhea or loose stools.    Yes  Historical Provider, MD   docusate sodium (COLACE) 100 MG capsule  Take 100 mg by mouth daily as needed for mild constipation.    Yes  Historical Provider, MD   furosemide (LASIX) 40 MG tablet  Take 40 mg by mouth 2 (two) times daily.  04/09/12   Yes  Historical Provider, MD   KLOR-CON M20 20 MEQ tablet  Take 20 mEq by mouth 2 (two) times daily.  03/23/12   Yes  Historical Provider, MD   Methen-Hyosc-Meth Blue-Na Phos (UROGESIC-BLUE) 81.6 MG TABS  Take 1 tablet by mouth daily as needed.    Yes  Historical Provider, MD   Multiple Vitamin (MULTIVITAMIN) tablet  Take 1 tablet by mouth daily.    Yes  Historical Provider, MD   TOPROL XL 50 MG 24 hr tablet  Take 75 mg by mouth daily.  01/29/12   Yes  Historical Provider, MD   vitamin B-12 (CYANOCOBALAMIN) 1000 MCG tablet  Take 1,000 mcg by mouth daily.    Yes  Historical Provider, MD    Results for orders placed during the hospital encounter of 09/11/13 (from the past 48 hour(s))   CBC WITH DIFFERENTIAL Status: Abnormal    Collection Time    09/11/13 2:10 PM   Result  Value  Ref Range    WBC  5.8  4.0 - 10.5 K/uL    RBC  4.80  3.87 - 5.11 MIL/uL    Hemoglobin  15.4 (*)  12.0 - 15.0 g/dL    HCT  44.5  36.0 - 46.0 %    MCV  92.7  78.0 - 100.0 fL    MCH  32.1  26.0 - 34.0 pg    MCHC  34.6  30.0 - 36.0 g/dL    RDW  13.3  11.5 - 15.5 %    Platelets  149 (*)  150 - 400 K/uL    Comment:  PLATELET COUNT CONFIRMED BY SMEAR     REPEATED TO VERIFY     SPECIMEN CHECKED FOR CLOTS    Neutrophils Relative %  72  43 - 77 %    Neutro Abs  4.2  1.7 -  7.7 K/uL    Lymphocytes Relative  19  12 - 46 %    Lymphs Abs  1.1  0.7 - 4.0 K/uL    Monocytes Relative  8  3 - 12 %    Monocytes Absolute  0.4  0.1 - 1.0 K/uL    Eosinophils Relative  1  0 - 5 %     Eosinophils Absolute  0.1  0.0 - 0.7 K/uL    Basophils Relative  0  0 - 1 %    Basophils Absolute  0.0  0.0 - 0.1 K/uL   PRO B NATRIURETIC PEPTIDE Status: Abnormal    Collection Time    09/11/13 2:10 PM   Result  Value  Ref Range    Pro B Natriuretic peptide (BNP)  534.9 (*)  0 - 450 pg/mL   I-STAT TROPOININ, ED Status: None    Collection Time    09/11/13 2:16 PM   Result  Value  Ref Range    Troponin i, poc  0.01  0.00 - 0.08 ng/mL    Comment 3      Comment:  Due to the release kinetics of cTnI,     a negative result within the first hours     of the onset of symptoms does not rule out     myocardial infarction with certainty.     If myocardial infarction is still suspected,     repeat the test at appropriate intervals.   I-STAT CHEM 8, ED Status: Abnormal    Collection Time    09/11/13 2:18 PM   Result  Value  Ref Range    Sodium  144  137 - 147 mEq/L    Potassium  3.9  3.7 - 5.3 mEq/L    Chloride  102  96 - 112 mEq/L    BUN  18  6 - 23 mg/dL    Creatinine, Ser  0.80  0.50 - 1.10 mg/dL    Glucose, Bld  143 (*)  70 - 99 mg/dL    Calcium, Ion  1.18  1.13 - 1.30 mmol/L    TCO2  30  0 - 100 mmol/L    Hemoglobin  16.0 (*)  12.0 - 15.0 g/dL    HCT  47.0 (*)  36.0 - 46.0 %    Dg Chest 2 View  09/11/2013 CLINICAL DATA: Chest pain and shortness of breath with history of CHF EXAM: CHEST 2 VIEW COMPARISON: PA and lateral chest of May 17, 2009 and a right rib series of January 26, 2011 FINDINGS: The lungs are adequately inflated and exhibit mildly increased density in the lower lobes. There is an azygos lobe type anatomy peer The cardiac silhouette is top-normal in size. The pulmonary vascularity is not engorged. There is tortuosity of the descending thoracic aorta. There is no pleural effusion. There are degenerative changes of the right shoulder. IMPRESSION: There is subsegmental atelectasis or early interstitial pneumonia in the lower lobes. There is no evidence of CHF.  Electronically Signed By: David Martinique On: 09/11/2013 15:09   Review of Systems  Constitutional: Positive for malaise/fatigue. Negative for diaphoresis.  Eyes: Positive for blurred vision.  Respiratory: Positive for shortness of breath.  Cardiovascular: Positive for palpitations and leg swelling. Negative for chest pain, orthopnea and PND.  Gastrointestinal: Negative for nausea and vomiting.  Musculoskeletal: Positive for neck pain.  Neurological: Positive for dizziness. Negative for loss of consciousness.  All other systems reviewed and are negative.   Blood pressure 148/63,  pulse 62, temperature 98.5 F (36.9 C), temperature source Oral, resp. rate 16, SpO2 99.00%.  Physical Exam  Constitutional: She appears well-developed and well-nourished. No distress.  Neck: No JVD present. Carotid bruit is not present.  Cardiovascular: Normal rate, regular rhythm, normal heart sounds and intact distal pulses. Exam reveals no gallop and no friction rub.  No murmur heard.  Respiratory: Breath sounds normal. No respiratory distress. She has no wheezes. She has no rales.  GI: Soft. Bowel sounds are normal.  Musculoskeletal: She exhibits edema (bilateral LEE).  Skin: Skin is warm and dry. She is not diaphoretic.  Psychiatric: She has a normal mood and affect. Her behavior is normal.   Assessment/Plan:  Principal Problem:  Bilateral neck pain  Active Problems:  Leg edema    1. Neck Pain: ? If anginal pain. Her EKG is fairly normal and POC troponin is negative. We will plan to admit overnight for observation. We will continue to cycle cardiac enzymes and will monitor on telemetry. Will obtain 2D echo while inpatient.    2. LEE: Pt states this is chronic but has seemed to have worsened some over the last few days. BNP is 534.9. Will obtain a 2D echo. Her renal function is normal. She has been on Lasix as an OP. ? IV lasix.   3. PAF: appears to be in NSR currently. Will monitor overnight on  telemetry. Continue home metoprolol.   4. Fatigue: Dr. Cathie Olden ordered a TSH as an outpatient, but this was not done. Will check TSH.    Rheanne Cortopassi, [place]  09/11/2013, 4:44 PM

## 2013-09-12 ENCOUNTER — Encounter (HOSPITAL_COMMUNITY): Payer: Self-pay | Admitting: Physician Assistant

## 2013-09-12 DIAGNOSIS — R0602 Shortness of breath: Secondary | ICD-10-CM | POA: Diagnosis not present

## 2013-09-12 DIAGNOSIS — I369 Nonrheumatic tricuspid valve disorder, unspecified: Secondary | ICD-10-CM

## 2013-09-12 DIAGNOSIS — M542 Cervicalgia: Secondary | ICD-10-CM | POA: Diagnosis not present

## 2013-09-12 LAB — CBC
HCT: 40.7 % (ref 36.0–46.0)
Hemoglobin: 13.8 g/dL (ref 12.0–15.0)
MCH: 31.4 pg (ref 26.0–34.0)
MCHC: 33.9 g/dL (ref 30.0–36.0)
MCV: 92.7 fL (ref 78.0–100.0)
Platelets: 148 10*3/uL — ABNORMAL LOW (ref 150–400)
RBC: 4.39 MIL/uL (ref 3.87–5.11)
RDW: 13.4 % (ref 11.5–15.5)
WBC: 6.1 10*3/uL (ref 4.0–10.5)

## 2013-09-12 LAB — BASIC METABOLIC PANEL
BUN: 16 mg/dL (ref 6–23)
CO2: 27 mEq/L (ref 19–32)
CREATININE: 0.71 mg/dL (ref 0.50–1.10)
Calcium: 9 mg/dL (ref 8.4–10.5)
Chloride: 103 mEq/L (ref 96–112)
GFR calc non Af Amer: 77 mL/min — ABNORMAL LOW (ref >90)
GFR, EST AFRICAN AMERICAN: 89 mL/min — AB (ref >90)
Glucose, Bld: 102 mg/dL — ABNORMAL HIGH (ref 70–99)
Potassium: 4.1 mEq/L (ref 3.7–5.3)
Sodium: 142 mEq/L (ref 137–147)

## 2013-09-12 LAB — TROPONIN I: Troponin I: <0.3 ng/mL (ref <0.30)

## 2013-09-12 LAB — TSH: TSH: 2.19 u[IU]/mL (ref 0.350–4.500)

## 2013-09-12 NOTE — Progress Notes (Signed)
Patient Name: Alexis Winters Date of Encounter: 09/12/2013     Principal Problem:   Bilateral neck pain Active Problems:   Leg edema   Angina at rest    SUBJECTIVE Denies any recurrent symptom. No SOB. Had palpitation yesterday with griping sensation over her neck. Occur about 1-3 times a month. Increasing episode recently  CURRENT MEDS . aspirin  324 mg Oral NOW   Or  . aspirin  300 mg Rectal NOW  . aspirin EC  81 mg Oral Daily  . furosemide  40 mg Oral BID  . heparin  5,000 Units Subcutaneous 3 times per day  . metoprolol succinate  75 mg Oral Daily  . potassium chloride SA  20 mEq Oral BID  . vitamin B-12  1,000 mcg Oral Daily    OBJECTIVE  Filed Vitals:   09/11/13 1840 09/11/13 2006 09/12/13 0536 09/12/13 0845  BP: 147/79 151/46 109/45 140/54  Pulse: 59 62 64 66  Temp: 98.7 F (37.1 C) 97.3 F (36.3 C) 97 F (36.1 C) 97.4 F (36.3 C)  TempSrc: Oral Oral Oral Oral  Resp: 16 18 18 16   Height: 5' (1.524 m)     Weight: 129 lb 6.6 oz (58.7 kg)  126 lb 15.8 oz (57.6 kg)   SpO2: 96% 99% 99% 94%    Intake/Output Summary (Last 24 hours) at 09/12/13 0933 Last data filed at 09/12/13 0930  Gross per 24 hour  Intake    480 ml  Output   1550 ml  Net  -1070 ml   Filed Weights   09/11/13 1840 09/12/13 0536  Weight: 129 lb 6.6 oz (58.7 kg) 126 lb 15.8 oz (57.6 kg)    PHYSICAL EXAM  General: Pleasant, NAD. Neuro: Alert and oriented X 3. Moves all extremities spontaneously. Psych: Normal affect. HEENT:  Normal  Neck: Supple without bruits or JVD. Lungs:  Resp regular and unlabored, CTA. Heart: RRR no s3, s4, or murmurs. Abdomen: Soft, non-tender, non-distended, BS + x 4.  Extremities: No clubbing, cyanosis or edema. DP/PT/Radials 2+ and equal bilaterally.  Accessory Clinical Findings  CBC  Recent Labs  09/11/13 1410  09/11/13 2044 09/12/13 0118  WBC 5.8  --  5.1 6.1  NEUTROABS 4.2  --   --   --   HGB 15.4*  < > 14.2 13.8  HCT 44.5  < > 42.3 40.7    MCV 92.7  --  94.2 92.7  PLT 149*  --  132* 148*  < > = values in this interval not displayed. Basic Metabolic Panel  Recent Labs  09/11/13 1418 09/11/13 2044 09/12/13 0118  NA 144  --  142  K 3.9  --  4.1  CL 102  --  103  CO2  --   --  27  GLUCOSE 143*  --  102*  BUN 18  --  16  CREATININE 0.80 0.71 0.71  CALCIUM  --   --  9.0   Liver Function Tests No results found for this basename: AST, ALT, ALKPHOS, BILITOT, PROT, ALBUMIN,  in the last 72 hours No results found for this basename: LIPASE, AMYLASE,  in the last 72 hours Cardiac Enzymes  Recent Labs  09/11/13 2044 09/12/13 0118  TROPONINI <0.30 <0.30    TELE  NSR without significant ventricular ectopy or SVT  ECG  NSR with t wave inversion in lead 3, abnormal R wave progression in anterior lead  Radiology/Studies  Dg Chest 2 View  09/11/2013  CLINICAL DATA:  Chest pain and shortness of breath with history of CHF  EXAM: CHEST  2 VIEW  COMPARISON:  PA and lateral chest of May 17, 2009 and a right rib series of January 26, 2011  FINDINGS: The lungs are adequately inflated and exhibit mildly increased density in the lower lobes. There is an azygos lobe type anatomy peer The cardiac silhouette is top-normal in size. The pulmonary vascularity is not engorged. There is tortuosity of the descending thoracic aorta. There is no pleural effusion. There are degenerative changes of the right shoulder.  IMPRESSION: There is subsegmental atelectasis or early interstitial pneumonia in the lower lobes. There is no evidence of CHF.   Electronically Signed   By: David  Martinique   On: 09/11/2013 15:09    ASSESSMENT AND PLAN  1. Neck pain with chest palpitation  - possibly related to ventricular or atrial arrythmia, however no recurrence since admission, Tele did not reveal any signfiicant ectopy overnight  - not a candidate for aggressive intervention, therefore will not obtain a stress test  - will need 30 day event monitor  upon discharge  - pending echo today to establish baseline, if normal can potentially be discharged later today  - continue BB for potential tachy-arrythmia  2. LE edema  - BNP 534. CXR shows possible etelectasis vs LL pneumonia, WBC normal, no cough, no fever, pneumonia unlikely. Does have mild intermittent rale on LLL. Monitor for now  - no sign of acute heart failure at this time  3. PAF: in NSR, continue Metoprolol  4. Fatigue. Obtain TSH   Signed, Almyra Deforest PA Pager: [phone]  Patient examined chart reviewed discussed with daughter.  She is very dramatic about her exhaustion Doubt cardiac issue Telemetry is benign  TSH and echo pending Possible d/c latter today or am  Jenkins Rouge

## 2013-09-12 NOTE — ED Provider Notes (Signed)
Medical screening examination/treatment/procedure(s) were performed by non-physician practitioner and as supervising physician I was immediately available for consultation/collaboration.   EKG Interpretation   Date/Time:  Thursday September 11 2013 13:44:01 EDT Ventricular Rate:  74 PR Interval:  58 QRS Duration: 75 QT Interval:  425 QTC Calculation: 471 R Axis:   -10 Text Interpretation:  Sinus rhythm Short PR interval Abnormal R-wave  progression, early transition Left ventricular hypertrophy Artifact  Confirmed by WARD,  DO, KRISTEN ([id number]) on 09/11/2013 2:31:55 PM        [place], DO 09/12/13 2707

## 2013-09-12 NOTE — Progress Notes (Signed)
Notified by central telemetry that pt had a PJC. Reviewed telemetry strip which appeared to be a PAC. Notified PA on call. Pt was asymptomatic and has been sleeping. Will continue to monitor pt closely.  Eulis Canner, Rn

## 2013-09-12 NOTE — Progress Notes (Signed)
UR Completed Brenda Graves-Bigelow, RN,BSN [phone]  

## 2013-09-12 NOTE — Discharge Summary (Signed)
Discharge Summary   Patient ID: Alexis Winters,  MRN: [id number], DOB/AGE: November 25, 1927 80 y.o.  Admit date: 09/11/2013 Discharge date: 09/12/2013  Primary Care Provider: Thamas Jaegers Primary Cardiologist: Dr. Acie Fredrickson  Discharge Diagnoses Principal Problem:   Bilateral neck pain Active Problems:   Leg edema   Chronic diastolic CHF (congestive heart failure)   Angina at rest   Allergies Allergies  Allergen Reactions  . Contrast Media [Iodinated Diagnostic Agents] Other (See Comments)    unknown  . Pyridium [Phenazopyridine Hcl] Other (See Comments)    unknown    Procedures  Transthoracic Echocardiography  LV EF: 65% - 70%  ------------------------------------------------------------------- Indications: CHF - 428.0.  ------------------------------------------------------------------- History: PMH: Chest pain. Atrial fibrillation.  ------------------------------------------------------------------- Study Conclusions  - Left ventricle: The cavity size was normal. Wall thickness was increased in a pattern of mild LVH. There was mild focal basal hypertrophy of the septum. Systolic function was vigorous. The estimated ejection fraction was in the range of 65% to 70%. Wall motion was normal; there were no regional wall motion abnormalities. Doppler parameters are consistent with abnormal left ventricular relaxation (grade 1 diastolic dysfunction). Doppler parameters are consistent with high ventricular filling pressure. - Mitral valve: Calcified annulus. - Tricuspid valve: There was moderate regurgitation.  Impressions:  - Vigorous LV function; proximal septal thickening with mildly increased LVOT gradient of 1.9 m/s; moderate TR.       EXAM: CHEST 2 VIEW  COMPARISON: PA and lateral chest of May 17, 2009 and a right rib series of January 26, 2011  FINDINGS: The lungs are adequately inflated and exhibit mildly increased density in the lower  lobes. There is an azygos lobe type anatomy peer The cardiac silhouette is top-normal in size. The pulmonary vascularity is not engorged. There is tortuosity of the descending thoracic aorta. There is no pleural effusion. There are degenerative changes of the right shoulder.  IMPRESSION: There is subsegmental atelectasis or early interstitial pneumonia in the lower lobes. There is no evidence of CHF.     Hospital Course  The patient is an 80 year old female formerly followed by Dr.Weintraub before his retirement and recently establish care with Dr. Acie Fredrickson. She presented to [facility] ED on 09/11/2013 with complaint of sudden onset of bilateral neck tightness/squeezing sensation that occurred around 11:30 AM after eating lunch. The episode lasted about 10 minutes before resolving. She denies any significant chest discomfort, however she does complain that her heart seems to be racing during the episodes. She had similar episodes before however this time was significant enough to cause some vision changes and severe dizziness, she states this is the only episode where she came close to passing out. Her symptom is concerning for symptomatic arrhythmia. Unfortunately the patient is a poor historian due to mild to moderate dementia. On arrival to the ED, she had mildly elevated BNP of 534. Serial troponin was negative. Chest x-ray showed some segmental atelectasis versus early pneumonia into lower lobes, however there is no evidence of CHF.  Given the patient's symptom, she was admitted overnight for observation. Due to her frailty, declining mental status and her advanced age, she is not a candidate for aggressive investigational study. Overnight telemetry did not reveal any significant arrythmia and patient did not have any recurrent symptom. Per her daughter, patient symptom can occur up to 3 times a month. She has a previously scheduled outpatient echocardiogram on 09/25/2013. Since she was admitted, it  was decided to obtain echo during this hospitalization. She underwent echo on  6/19 which showed EF 65-70%, no regional wall motion abnormalities, grade 1 diastolic dysfunction, high ventricular filling pressure and moderate MR. Patient will be discharged home with a 30 day event monitor. Office has been called, they will contact the patient on 09/15/2013 to setup an 30 day event monitor. Of note, patient has a previously scheduled followup with Dr. Acie Fredrickson on 10/21/2013 at which time Dr. Acie Fredrickson will review the patient's event monitor to see if her symptom is caused by arrhythmia.  Patient has a TSH order from office, however it couldn't be drawn due to inability to obtain IV access. Patient has TSH drawn during hospitalization and is currently pending. Her heart rate has been consistently in the 60s, without significant tachycardic episodes. After discussing with Dr. Johnsie Cancel, it is felt patient is stable for discharge. Dr. Letta Pate will follow up on TSH result during outpatient visit.    Discharge Vitals Blood pressure 124/54, pulse 72, temperature 97.4 F (36.3 C), temperature source Oral, resp. rate 18, height 5' (1.524 m), weight 126 lb 15.8 oz (57.6 kg), SpO2 98.00%.  Filed Weights   09/11/13 1840 09/12/13 0536  Weight: 129 lb 6.6 oz (58.7 kg) 126 lb 15.8 oz (57.6 kg)    Labs  CBC  Recent Labs  09/11/13 1410  09/11/13 2044 09/12/13 0118  WBC 5.8  --  5.1 6.1  NEUTROABS 4.2  --   --   --   HGB 15.4*  < > 14.2 13.8  HCT 44.5  < > 42.3 40.7  MCV 92.7  --  94.2 92.7  PLT 149*  --  132* 148*  < > = values in this interval not displayed. Basic Metabolic Panel  Recent Labs  09/11/13 1418 09/11/13 2044 09/12/13 0118  NA 144  --  142  K 3.9  --  4.1  CL 102  --  103  CO2  --   --  27  GLUCOSE 143*  --  102*  BUN 18  --  16  CREATININE 0.80 0.71 0.71  CALCIUM  --   --  9.0   Liver Function Tests No results found for this basename: AST, ALT, ALKPHOS, BILITOT, PROT, ALBUMIN,  in the  last 72 hours No results found for this basename: LIPASE, AMYLASE,  in the last 72 hours Cardiac Enzymes  Recent Labs  09/11/13 2044 09/12/13 0118 09/12/13 0940  TROPONINI <0.30 <0.30 <0.30    Disposition  Pt is being discharged home today in good condition.  Follow-up Plans & Appointments      Follow-up Information   Follow up with [facility]. (Office will call patient to set up 30 day event monitor on Monday on 09/15/2013)    Specialty:  Cardiology   Contact information:   [address] [place] [address] [phone]      Follow up with Mertie Moores On 10/21/2013. (11:15am)    Contact information:   [address] [place] [address] [phone]      Discharge Medications    Medication List         aspirin EC 81 MG tablet  Take 81 mg by mouth daily.     diphenoxylate-atropine 2.5-0.025 MG per tablet  Commonly known as:  LOMOTIL  Take 1 tablet by mouth daily as needed for diarrhea or loose stools.     docusate sodium 100 MG capsule  Commonly known as:  COLACE  Take 100 mg by mouth daily as needed  for mild constipation.     furosemide 40 MG tablet  Commonly known as:  LASIX  Take 40 mg by mouth 2 (two) times daily.     KLOR-CON M20 20 MEQ tablet  Generic drug:  potassium chloride SA  Take 20 mEq by mouth 2 (two) times daily.     multivitamin tablet  Take 1 tablet by mouth daily.     TOPROL XL 50 MG 24 hr tablet  Generic drug:  metoprolol succinate  Take 75 mg by mouth daily.     UROGESIC-BLUE 81.6 MG Tabs  Take 1 tablet by mouth daily as needed.     vitamin B-12 1000 MCG tablet  Commonly known as:  CYANOCOBALAMIN  Take 1,000 mcg by mouth daily.        Outstanding Labs/Studies  Pending TSH result. Dr. Letta Pate to review on follow up  Duration of Discharge Encounter   Greater than 30 minutes including physician time.  Signed, Almyra Deforest PA-C 09/12/2013, 5:20 PM

## 2013-09-12 NOTE — Progress Notes (Signed)
Discussed discharge instructions with pt and daughter including follow up appts, medications to take at home, activity, and diet. Pt and daughter verbalized understanding. Pt refused PM dose of Lasix here at the hospital and wants to take as soon as she gets home so that she won't have to stop to use the restroom.   Eulis Canner, RN

## 2013-09-18 ENCOUNTER — Encounter: Payer: Self-pay | Admitting: *Deleted

## 2013-09-18 ENCOUNTER — Encounter (INDEPENDENT_AMBULATORY_CARE_PROVIDER_SITE_OTHER): Payer: Medicare Other

## 2013-09-18 ENCOUNTER — Other Ambulatory Visit: Payer: Self-pay | Admitting: *Deleted

## 2013-09-18 DIAGNOSIS — I480 Paroxysmal atrial fibrillation: Secondary | ICD-10-CM

## 2013-09-18 DIAGNOSIS — I4891 Unspecified atrial fibrillation: Secondary | ICD-10-CM | POA: Diagnosis not present

## 2013-09-18 NOTE — Progress Notes (Signed)
Patient ID: Alexis Winters, female   DOB: [birth year], 80 y.o.   MRN: [id number] [facility] 30 day cardiac event monitor applied to patient.

## 2013-09-24 ENCOUNTER — Ambulatory Visit: Payer: Medicare Other | Admitting: Cardiology

## 2013-09-25 ENCOUNTER — Other Ambulatory Visit: Payer: Medicare Other

## 2013-10-03 DIAGNOSIS — R609 Edema, unspecified: Secondary | ICD-10-CM | POA: Diagnosis not present

## 2013-10-08 DIAGNOSIS — I10 Essential (primary) hypertension: Secondary | ICD-10-CM | POA: Diagnosis not present

## 2013-10-08 DIAGNOSIS — I509 Heart failure, unspecified: Secondary | ICD-10-CM | POA: Diagnosis not present

## 2013-10-08 DIAGNOSIS — M479 Spondylosis, unspecified: Secondary | ICD-10-CM | POA: Diagnosis not present

## 2013-10-08 DIAGNOSIS — G309 Alzheimer's disease, unspecified: Secondary | ICD-10-CM | POA: Diagnosis not present

## 2013-10-08 DIAGNOSIS — Z9181 History of falling: Secondary | ICD-10-CM | POA: Diagnosis not present

## 2013-10-08 DIAGNOSIS — M169 Osteoarthritis of hip, unspecified: Secondary | ICD-10-CM | POA: Diagnosis not present

## 2013-10-08 DIAGNOSIS — IMO0002 Reserved for concepts with insufficient information to code with codable children: Secondary | ICD-10-CM | POA: Diagnosis not present

## 2013-10-08 DIAGNOSIS — M1610 Unilateral primary osteoarthritis, unspecified hip: Secondary | ICD-10-CM | POA: Diagnosis not present

## 2013-10-08 DIAGNOSIS — F0280 Dementia in other diseases classified elsewhere without behavioral disturbance: Secondary | ICD-10-CM | POA: Diagnosis not present

## 2013-10-08 DIAGNOSIS — I4891 Unspecified atrial fibrillation: Secondary | ICD-10-CM | POA: Diagnosis not present

## 2013-10-08 DIAGNOSIS — M1710 Unilateral primary osteoarthritis, unspecified knee: Secondary | ICD-10-CM | POA: Diagnosis not present

## 2013-10-09 DIAGNOSIS — R3989 Other symptoms and signs involving the genitourinary system: Secondary | ICD-10-CM | POA: Diagnosis not present

## 2013-10-09 DIAGNOSIS — N3020 Other chronic cystitis without hematuria: Secondary | ICD-10-CM | POA: Diagnosis not present

## 2013-10-10 DIAGNOSIS — M1610 Unilateral primary osteoarthritis, unspecified hip: Secondary | ICD-10-CM | POA: Diagnosis not present

## 2013-10-10 DIAGNOSIS — I509 Heart failure, unspecified: Secondary | ICD-10-CM | POA: Diagnosis not present

## 2013-10-10 DIAGNOSIS — G309 Alzheimer's disease, unspecified: Secondary | ICD-10-CM | POA: Diagnosis not present

## 2013-10-10 DIAGNOSIS — M169 Osteoarthritis of hip, unspecified: Secondary | ICD-10-CM | POA: Diagnosis not present

## 2013-10-10 DIAGNOSIS — F0280 Dementia in other diseases classified elsewhere without behavioral disturbance: Secondary | ICD-10-CM | POA: Diagnosis not present

## 2013-10-10 DIAGNOSIS — I10 Essential (primary) hypertension: Secondary | ICD-10-CM | POA: Diagnosis not present

## 2013-10-10 DIAGNOSIS — I4891 Unspecified atrial fibrillation: Secondary | ICD-10-CM | POA: Diagnosis not present

## 2013-10-13 DIAGNOSIS — M169 Osteoarthritis of hip, unspecified: Secondary | ICD-10-CM | POA: Diagnosis not present

## 2013-10-13 DIAGNOSIS — I509 Heart failure, unspecified: Secondary | ICD-10-CM | POA: Diagnosis not present

## 2013-10-13 DIAGNOSIS — F0280 Dementia in other diseases classified elsewhere without behavioral disturbance: Secondary | ICD-10-CM | POA: Diagnosis not present

## 2013-10-13 DIAGNOSIS — I10 Essential (primary) hypertension: Secondary | ICD-10-CM | POA: Diagnosis not present

## 2013-10-13 DIAGNOSIS — M1610 Unilateral primary osteoarthritis, unspecified hip: Secondary | ICD-10-CM | POA: Diagnosis not present

## 2013-10-13 DIAGNOSIS — I4891 Unspecified atrial fibrillation: Secondary | ICD-10-CM | POA: Diagnosis not present

## 2013-10-15 DIAGNOSIS — I10 Essential (primary) hypertension: Secondary | ICD-10-CM | POA: Diagnosis not present

## 2013-10-15 DIAGNOSIS — I509 Heart failure, unspecified: Secondary | ICD-10-CM | POA: Diagnosis not present

## 2013-10-15 DIAGNOSIS — F0280 Dementia in other diseases classified elsewhere without behavioral disturbance: Secondary | ICD-10-CM | POA: Diagnosis not present

## 2013-10-15 DIAGNOSIS — M169 Osteoarthritis of hip, unspecified: Secondary | ICD-10-CM | POA: Diagnosis not present

## 2013-10-15 DIAGNOSIS — G309 Alzheimer's disease, unspecified: Secondary | ICD-10-CM | POA: Diagnosis not present

## 2013-10-15 DIAGNOSIS — M1610 Unilateral primary osteoarthritis, unspecified hip: Secondary | ICD-10-CM | POA: Diagnosis not present

## 2013-10-15 DIAGNOSIS — I4891 Unspecified atrial fibrillation: Secondary | ICD-10-CM | POA: Diagnosis not present

## 2013-10-20 DIAGNOSIS — G309 Alzheimer's disease, unspecified: Secondary | ICD-10-CM | POA: Diagnosis not present

## 2013-10-20 DIAGNOSIS — I10 Essential (primary) hypertension: Secondary | ICD-10-CM | POA: Diagnosis not present

## 2013-10-20 DIAGNOSIS — I4891 Unspecified atrial fibrillation: Secondary | ICD-10-CM | POA: Diagnosis not present

## 2013-10-20 DIAGNOSIS — F0280 Dementia in other diseases classified elsewhere without behavioral disturbance: Secondary | ICD-10-CM | POA: Diagnosis not present

## 2013-10-20 DIAGNOSIS — M1610 Unilateral primary osteoarthritis, unspecified hip: Secondary | ICD-10-CM | POA: Diagnosis not present

## 2013-10-20 DIAGNOSIS — M169 Osteoarthritis of hip, unspecified: Secondary | ICD-10-CM | POA: Diagnosis not present

## 2013-10-20 DIAGNOSIS — I509 Heart failure, unspecified: Secondary | ICD-10-CM | POA: Diagnosis not present

## 2013-10-21 ENCOUNTER — Ambulatory Visit (INDEPENDENT_AMBULATORY_CARE_PROVIDER_SITE_OTHER): Payer: Medicare Other | Admitting: Cardiovascular Disease

## 2013-10-21 ENCOUNTER — Encounter: Payer: Self-pay | Admitting: Cardiovascular Disease

## 2013-10-21 VITALS — BP 140/72 | HR 65 | Ht 59.0 in | Wt 126.5 lb

## 2013-10-21 DIAGNOSIS — I509 Heart failure, unspecified: Secondary | ICD-10-CM

## 2013-10-21 DIAGNOSIS — I4891 Unspecified atrial fibrillation: Secondary | ICD-10-CM

## 2013-10-21 DIAGNOSIS — I5032 Chronic diastolic (congestive) heart failure: Secondary | ICD-10-CM | POA: Diagnosis not present

## 2013-10-21 DIAGNOSIS — I480 Paroxysmal atrial fibrillation: Secondary | ICD-10-CM

## 2013-10-21 NOTE — Patient Instructions (Signed)
Your physician wants you to follow-up in: 6 months  You will receive a reminder letter in the mail two months in advance. If you don't receive a letter, please call our office to schedule the follow-up appointment.  Your physician recommends that you continue on your current medications as directed. Please refer to the Current Medication list given to you today.  

## 2013-10-21 NOTE — Assessment & Plan Note (Addendum)
Alexis Winters presents today for followup visit. She was admitted to the hospital with what sounds like some chest pain and possibly slight worsening of her diastolic congestive heart failure. Her dementia is  progressively worsening she really has a difficult time in telling us what she feels like.  We will continue with conservative medical therapy.  At some point in and also a future, we'll need to consider placement into a skilled nursing facility. For now she seems to be doing well at home and is being cared for by her family. I'll see her back in the office in 6 months for followup office visit.

## 2013-10-21 NOTE — Progress Notes (Signed)
Alexis Winters Date of Birth  18-Dec-1927       [facility] [address], [address], [place], [place]  [address]   [place], [place]  [address] [place]   Fax  [phone]     Fax [phone]  Problem List: 1. Atrial fibrillation 2. Congestive heart failure 3. Cardiac cath with cardiac arrest  History of Present Illness:  Alexis Winters is a 80 yo with hx of CHF, palpitations ( lasts for a minute or so).  This past Friday, she was seen at her medical doctors office - tachycardia, fatigue, lots of peripheral edema.    She has these spells on occasion.    For the past 2 weeks she has had progressive "spells" with severe dyspnea, profound fatigue,  Some mild , vague chest tightness, tachypalpitations.  She also has some left hand tingling.    October 21, 2013:  Alexis Winters was admitted to the hospital with vague symptoms -- ? CP and increased dyspnea. Echo shows normal LV function.  Moderate MR. She is doing better.  Has home health coming out  Her dementia is gradually worsening.   Current Outpatient Prescriptions on File Prior to Visit  Medication Sig Dispense Refill  . aspirin EC 81 MG tablet Take 81 mg by mouth daily.      . diphenoxylate-atropine (LOMOTIL) 2.5-0.025 MG per tablet Take 1 tablet by mouth daily as needed for diarrhea or loose stools.       . docusate sodium (COLACE) 100 MG capsule Take 100 mg by mouth daily as needed for mild constipation.       . furosemide (LASIX) 40 MG tablet Take 40 mg by mouth 2 (two) times daily.       Marland Kitchen KLOR-CON M20 20 MEQ tablet Take 20 mEq by mouth 2 (two) times daily.       . Methen-Hyosc-Meth Blue-Na Phos (UROGESIC-BLUE) 81.6 MG TABS Take 1 tablet by mouth daily as needed.       . Multiple Vitamin (MULTIVITAMIN) tablet Take 1 tablet by mouth daily.      . TOPROL XL 50 MG 24 hr tablet Take 75 mg by mouth daily.       . vitamin B-12 (CYANOCOBALAMIN) 1000  MCG tablet Take 1,000 mcg by mouth daily.       No current facility-administered medications on file prior to visit.    Allergies  Allergen Reactions  . Contrast Media [Iodinated Diagnostic Agents] Other (See Comments)    unknown  . Pyridium [Phenazopyridine Hcl] Other (See Comments)    unknown    Past Medical History  Diagnosis Date  . Chronic diastolic CHF (congestive heart failure)     a. echo 09/12/13 EF 10-25%, grade 1 diastolic dysf, mild LVOT obstr, moderate MR, elevated filling pressure  . Hypoglycemia   . Hard of hearing   . Phlebitis     l leg  . Hypertension   . Frequent UTI   . Enlarged liver   . Hemorrhoids   . Dyslipidemia   . OA (osteoarthritis)     hip and back  . Atrial fibrillation     a. h/o PAF, in NSR during admission 09/11/13  . Allergic rhinitis   . GERD (gastroesophageal reflux disease)   . Prediabetes   . Arrhythmia     a. office to call pt on 09/15/13 for 30 day event monitor  .  Breast cancer   . Mitral regurgitation     a. moderate MR by echo 09/12/2013    Past Surgical History  Procedure Laterality Date  . Abdominal hysterectomy    . Bladder suspension    . Breast lumpectomy    . Foot surgery      left foot  . Inner ear surgery    . Cardiac catheterization  1980's    [facility]    History  Smoking status  . Former Smoker  Smokeless tobacco  . Never Used    History  Alcohol Use No    Family History  Problem Relation Age of Onset  . Cerebral palsy Sister   . Alzheimer's disease Brother   . Hypertension Father   . Heart disease Brother     Reviw of Systems:  Reviewed in the HPI.  All other systems are negative.  Physical Exam: Blood pressure 140/72, pulse 65, height 4\' 11"  (1.499 m), weight 126 lb 8 oz (57.38 kg). Wt Readings from Last 3 Encounters:  10/21/13 126 lb 8 oz (57.38 kg)  09/12/13 126 lb 15.8 oz (57.6 kg)  09/05/13 129 lb (58.514 kg)     General: Frail, elderly white female. She is hard of hearing. She seems to  be easily distracted. I suspect that she has mild dementia.  Head: Normocephalic, atraumatic, sclera non-icteric, mucus membranes are moist,   Neck: Supple. Carotids are 2 + without bruits. No JVD   Lungs: Clear   Heart:  Regular rate, S1-S2.  Abdomen: Soft, non-tender, non-distended with normal bowel sounds.  Msk:  Strength and tone are normal   Extremities: No clubbing or cyanosis. 2+ pitting edema.  The legs appear to be chronically edematous.  Distal pedal pulses are 2+ and equal  She's quite frail. She walks very slowly and seemed to need a lot of assistance. We had to assist her  getting up and down off the exam table.  Neuro: CN II - XII intact.  Except she is partially deaf.  Alert and oriented X 3.   Psych:   She seems to have at least mild dementia. Her hearing impairment makes assessment of this difficulty.  ECG: 10/13/2013: Normal sinus rhythm at 65. RV conduction delay.  Assessment / Plan:

## 2013-10-22 ENCOUNTER — Telehealth: Payer: Self-pay | Admitting: Nurse Practitioner

## 2013-10-22 DIAGNOSIS — I10 Essential (primary) hypertension: Secondary | ICD-10-CM | POA: Diagnosis not present

## 2013-10-22 DIAGNOSIS — I509 Heart failure, unspecified: Secondary | ICD-10-CM | POA: Diagnosis not present

## 2013-10-22 DIAGNOSIS — M169 Osteoarthritis of hip, unspecified: Secondary | ICD-10-CM | POA: Diagnosis not present

## 2013-10-22 DIAGNOSIS — I4891 Unspecified atrial fibrillation: Secondary | ICD-10-CM | POA: Diagnosis not present

## 2013-10-22 DIAGNOSIS — M1610 Unilateral primary osteoarthritis, unspecified hip: Secondary | ICD-10-CM | POA: Diagnosis not present

## 2013-10-22 DIAGNOSIS — G309 Alzheimer's disease, unspecified: Secondary | ICD-10-CM | POA: Diagnosis not present

## 2013-10-22 DIAGNOSIS — F0280 Dementia in other diseases classified elsewhere without behavioral disturbance: Secondary | ICD-10-CM | POA: Diagnosis not present

## 2013-10-22 NOTE — Telephone Encounter (Signed)
Spoke with patient's daughter, Andrey Campanile, and advised her of patient's monitor results per Dr. Acie Fredrickson:  NSR; sinus tachycardia vs. SVT which daughter reports does not occur frequently.  I advised patient's daughter to notify us if these episodes occur more frequently; I advised her that patient should try taking slow deep breaths when these episodes occur and if her heart rate does not slow down or if she becomes more symptomatic to call 911.  Sandee verbalized understanding and agreement with advice.

## 2013-10-27 DIAGNOSIS — I509 Heart failure, unspecified: Secondary | ICD-10-CM | POA: Diagnosis not present

## 2013-10-27 DIAGNOSIS — I10 Essential (primary) hypertension: Secondary | ICD-10-CM | POA: Diagnosis not present

## 2013-10-27 DIAGNOSIS — I4891 Unspecified atrial fibrillation: Secondary | ICD-10-CM | POA: Diagnosis not present

## 2013-10-27 DIAGNOSIS — M1610 Unilateral primary osteoarthritis, unspecified hip: Secondary | ICD-10-CM | POA: Diagnosis not present

## 2013-10-27 DIAGNOSIS — M169 Osteoarthritis of hip, unspecified: Secondary | ICD-10-CM | POA: Diagnosis not present

## 2013-10-27 DIAGNOSIS — F0280 Dementia in other diseases classified elsewhere without behavioral disturbance: Secondary | ICD-10-CM | POA: Diagnosis not present

## 2013-10-31 DIAGNOSIS — J309 Allergic rhinitis, unspecified: Secondary | ICD-10-CM | POA: Diagnosis not present

## 2013-11-25 DIAGNOSIS — M1710 Unilateral primary osteoarthritis, unspecified knee: Secondary | ICD-10-CM | POA: Diagnosis not present

## 2013-12-26 ENCOUNTER — Other Ambulatory Visit: Payer: Self-pay | Admitting: Internal Medicine

## 2013-12-26 DIAGNOSIS — F29 Unspecified psychosis not due to a substance or known physiological condition: Secondary | ICD-10-CM | POA: Diagnosis not present

## 2013-12-26 DIAGNOSIS — I10 Essential (primary) hypertension: Secondary | ICD-10-CM | POA: Diagnosis not present

## 2013-12-26 DIAGNOSIS — R410 Disorientation, unspecified: Secondary | ICD-10-CM | POA: Diagnosis not present

## 2013-12-26 DIAGNOSIS — Z9181 History of falling: Secondary | ICD-10-CM | POA: Diagnosis not present

## 2013-12-26 DIAGNOSIS — R42 Dizziness and giddiness: Secondary | ICD-10-CM | POA: Diagnosis not present

## 2013-12-26 DIAGNOSIS — Z23 Encounter for immunization: Secondary | ICD-10-CM | POA: Diagnosis not present

## 2013-12-31 ENCOUNTER — Other Ambulatory Visit: Payer: Medicare Other

## 2014-01-01 ENCOUNTER — Ambulatory Visit
Admission: RE | Admit: 2014-01-01 | Discharge: 2014-01-01 | Disposition: A | Discharge status: Home / Self Care | Payer: Medicare Other | Source: Ambulatory Visit | Attending: Internal Medicine | Admitting: Internal Medicine

## 2014-01-01 DIAGNOSIS — F29 Unspecified psychosis not due to a substance or known physiological condition: Secondary | ICD-10-CM | POA: Diagnosis not present

## 2014-01-01 DIAGNOSIS — R410 Disorientation, unspecified: Secondary | ICD-10-CM

## 2014-01-02 DIAGNOSIS — C159 Malignant neoplasm of esophagus, unspecified: Secondary | ICD-10-CM | POA: Diagnosis not present

## 2014-01-02 DIAGNOSIS — F0390 Unspecified dementia without behavioral disturbance: Secondary | ICD-10-CM | POA: Diagnosis not present

## 2014-01-02 DIAGNOSIS — M170 Bilateral primary osteoarthritis of knee: Secondary | ICD-10-CM | POA: Diagnosis not present

## 2014-01-02 DIAGNOSIS — R262 Difficulty in walking, not elsewhere classified: Secondary | ICD-10-CM | POA: Diagnosis not present

## 2014-01-02 DIAGNOSIS — I509 Heart failure, unspecified: Secondary | ICD-10-CM | POA: Diagnosis not present

## 2014-01-05 DIAGNOSIS — F0390 Unspecified dementia without behavioral disturbance: Secondary | ICD-10-CM | POA: Diagnosis not present

## 2014-01-05 DIAGNOSIS — R269 Unspecified abnormalities of gait and mobility: Secondary | ICD-10-CM | POA: Diagnosis not present

## 2014-01-05 DIAGNOSIS — M1990 Unspecified osteoarthritis, unspecified site: Secondary | ICD-10-CM | POA: Diagnosis not present

## 2014-01-05 DIAGNOSIS — Z23 Encounter for immunization: Secondary | ICD-10-CM | POA: Diagnosis not present

## 2014-01-06 DIAGNOSIS — I509 Heart failure, unspecified: Secondary | ICD-10-CM | POA: Diagnosis not present

## 2014-01-06 DIAGNOSIS — M170 Bilateral primary osteoarthritis of knee: Secondary | ICD-10-CM | POA: Diagnosis not present

## 2014-01-07 DIAGNOSIS — I509 Heart failure, unspecified: Secondary | ICD-10-CM | POA: Diagnosis not present

## 2014-01-07 DIAGNOSIS — M170 Bilateral primary osteoarthritis of knee: Secondary | ICD-10-CM | POA: Diagnosis not present

## 2014-01-08 DIAGNOSIS — I509 Heart failure, unspecified: Secondary | ICD-10-CM | POA: Diagnosis not present

## 2014-01-08 DIAGNOSIS — M170 Bilateral primary osteoarthritis of knee: Secondary | ICD-10-CM | POA: Diagnosis not present

## 2014-01-09 DIAGNOSIS — M170 Bilateral primary osteoarthritis of knee: Secondary | ICD-10-CM | POA: Diagnosis not present

## 2014-01-09 DIAGNOSIS — I509 Heart failure, unspecified: Secondary | ICD-10-CM | POA: Diagnosis not present

## 2014-01-12 DIAGNOSIS — M170 Bilateral primary osteoarthritis of knee: Secondary | ICD-10-CM | POA: Diagnosis not present

## 2014-01-12 DIAGNOSIS — I509 Heart failure, unspecified: Secondary | ICD-10-CM | POA: Diagnosis not present

## 2014-01-13 DIAGNOSIS — M170 Bilateral primary osteoarthritis of knee: Secondary | ICD-10-CM | POA: Diagnosis not present

## 2014-01-13 DIAGNOSIS — I509 Heart failure, unspecified: Secondary | ICD-10-CM | POA: Diagnosis not present

## 2014-01-14 DIAGNOSIS — M170 Bilateral primary osteoarthritis of knee: Secondary | ICD-10-CM | POA: Diagnosis not present

## 2014-01-14 DIAGNOSIS — I509 Heart failure, unspecified: Secondary | ICD-10-CM | POA: Diagnosis not present

## 2014-01-15 DIAGNOSIS — I509 Heart failure, unspecified: Secondary | ICD-10-CM | POA: Diagnosis not present

## 2014-01-15 DIAGNOSIS — M170 Bilateral primary osteoarthritis of knee: Secondary | ICD-10-CM | POA: Diagnosis not present

## 2014-01-20 DIAGNOSIS — M170 Bilateral primary osteoarthritis of knee: Secondary | ICD-10-CM | POA: Diagnosis not present

## 2014-01-20 DIAGNOSIS — I509 Heart failure, unspecified: Secondary | ICD-10-CM | POA: Diagnosis not present

## 2014-01-21 DIAGNOSIS — M170 Bilateral primary osteoarthritis of knee: Secondary | ICD-10-CM | POA: Diagnosis not present

## 2014-01-21 DIAGNOSIS — I509 Heart failure, unspecified: Secondary | ICD-10-CM | POA: Diagnosis not present

## 2014-01-23 DIAGNOSIS — M170 Bilateral primary osteoarthritis of knee: Secondary | ICD-10-CM | POA: Diagnosis not present

## 2014-01-23 DIAGNOSIS — I509 Heart failure, unspecified: Secondary | ICD-10-CM | POA: Diagnosis not present

## 2014-01-26 DIAGNOSIS — M170 Bilateral primary osteoarthritis of knee: Secondary | ICD-10-CM | POA: Diagnosis not present

## 2014-01-26 DIAGNOSIS — I509 Heart failure, unspecified: Secondary | ICD-10-CM | POA: Diagnosis not present

## 2014-01-27 DIAGNOSIS — M170 Bilateral primary osteoarthritis of knee: Secondary | ICD-10-CM | POA: Diagnosis not present

## 2014-01-27 DIAGNOSIS — I509 Heart failure, unspecified: Secondary | ICD-10-CM | POA: Diagnosis not present

## 2014-01-28 DIAGNOSIS — M170 Bilateral primary osteoarthritis of knee: Secondary | ICD-10-CM | POA: Diagnosis not present

## 2014-01-28 DIAGNOSIS — I509 Heart failure, unspecified: Secondary | ICD-10-CM | POA: Diagnosis not present

## 2014-01-29 DIAGNOSIS — I509 Heart failure, unspecified: Secondary | ICD-10-CM | POA: Diagnosis not present

## 2014-01-29 DIAGNOSIS — M170 Bilateral primary osteoarthritis of knee: Secondary | ICD-10-CM | POA: Diagnosis not present

## 2014-02-02 DIAGNOSIS — I5030 Unspecified diastolic (congestive) heart failure: Secondary | ICD-10-CM | POA: Diagnosis not present

## 2014-02-02 DIAGNOSIS — I509 Heart failure, unspecified: Secondary | ICD-10-CM | POA: Diagnosis not present

## 2014-02-02 DIAGNOSIS — R296 Repeated falls: Secondary | ICD-10-CM | POA: Diagnosis not present

## 2014-02-02 DIAGNOSIS — F0390 Unspecified dementia without behavioral disturbance: Secondary | ICD-10-CM | POA: Diagnosis not present

## 2014-02-02 DIAGNOSIS — M170 Bilateral primary osteoarthritis of knee: Secondary | ICD-10-CM | POA: Diagnosis not present

## 2014-02-02 DIAGNOSIS — R269 Unspecified abnormalities of gait and mobility: Secondary | ICD-10-CM | POA: Diagnosis not present

## 2014-02-10 DIAGNOSIS — M170 Bilateral primary osteoarthritis of knee: Secondary | ICD-10-CM | POA: Diagnosis not present

## 2014-02-10 DIAGNOSIS — I509 Heart failure, unspecified: Secondary | ICD-10-CM | POA: Diagnosis not present

## 2014-02-18 DIAGNOSIS — I509 Heart failure, unspecified: Secondary | ICD-10-CM | POA: Diagnosis not present

## 2014-02-18 DIAGNOSIS — M170 Bilateral primary osteoarthritis of knee: Secondary | ICD-10-CM | POA: Diagnosis not present

## 2014-02-26 ENCOUNTER — Other Ambulatory Visit: Payer: Self-pay | Admitting: Nurse Practitioner

## 2014-02-26 ENCOUNTER — Ambulatory Visit
Admission: RE | Admit: 2014-02-26 | Discharge: 2014-02-26 | Disposition: A | Discharge status: Home / Self Care | Payer: Medicare Other | Source: Ambulatory Visit | Attending: Nurse Practitioner | Admitting: Nurse Practitioner

## 2014-02-26 DIAGNOSIS — N3000 Acute cystitis without hematuria: Secondary | ICD-10-CM | POA: Diagnosis not present

## 2014-02-26 DIAGNOSIS — M545 Low back pain: Secondary | ICD-10-CM | POA: Diagnosis not present

## 2014-02-26 DIAGNOSIS — S3993XA Unspecified injury of pelvis, initial encounter: Secondary | ICD-10-CM | POA: Diagnosis not present

## 2014-02-26 DIAGNOSIS — M5489 Other dorsalgia: Secondary | ICD-10-CM

## 2014-02-26 DIAGNOSIS — R3989 Other symptoms and signs involving the genitourinary system: Secondary | ICD-10-CM | POA: Diagnosis not present

## 2014-02-26 DIAGNOSIS — S3992XA Unspecified injury of lower back, initial encounter: Secondary | ICD-10-CM | POA: Diagnosis not present

## 2014-02-26 DIAGNOSIS — M549 Dorsalgia, unspecified: Secondary | ICD-10-CM | POA: Diagnosis not present

## 2014-02-26 DIAGNOSIS — R3915 Urgency of urination: Secondary | ICD-10-CM | POA: Diagnosis not present

## 2014-02-26 DIAGNOSIS — W19XXXA Unspecified fall, initial encounter: Secondary | ICD-10-CM | POA: Diagnosis not present

## 2014-04-03 DIAGNOSIS — I5022 Chronic systolic (congestive) heart failure: Secondary | ICD-10-CM | POA: Diagnosis not present

## 2014-04-03 DIAGNOSIS — R296 Repeated falls: Secondary | ICD-10-CM | POA: Diagnosis not present

## 2014-04-03 DIAGNOSIS — K219 Gastro-esophageal reflux disease without esophagitis: Secondary | ICD-10-CM | POA: Diagnosis not present

## 2014-04-24 DIAGNOSIS — R3915 Urgency of urination: Secondary | ICD-10-CM | POA: Diagnosis not present

## 2014-04-24 DIAGNOSIS — R3989 Other symptoms and signs involving the genitourinary system: Secondary | ICD-10-CM | POA: Diagnosis not present

## 2014-04-24 DIAGNOSIS — N3020 Other chronic cystitis without hematuria: Secondary | ICD-10-CM | POA: Diagnosis not present

## 2014-04-24 DIAGNOSIS — M1712 Unilateral primary osteoarthritis, left knee: Secondary | ICD-10-CM | POA: Diagnosis not present

## 2014-04-24 DIAGNOSIS — M7062 Trochanteric bursitis, left hip: Secondary | ICD-10-CM | POA: Diagnosis not present

## 2014-04-24 DIAGNOSIS — R350 Frequency of micturition: Secondary | ICD-10-CM | POA: Diagnosis not present

## 2014-05-22 DIAGNOSIS — H52223 Regular astigmatism, bilateral: Secondary | ICD-10-CM | POA: Diagnosis not present

## 2014-05-22 DIAGNOSIS — H5203 Hypermetropia, bilateral: Secondary | ICD-10-CM | POA: Diagnosis not present

## 2014-05-22 DIAGNOSIS — H2513 Age-related nuclear cataract, bilateral: Secondary | ICD-10-CM | POA: Diagnosis not present

## 2014-07-03 DIAGNOSIS — Z1389 Encounter for screening for other disorder: Secondary | ICD-10-CM | POA: Diagnosis not present

## 2014-07-03 DIAGNOSIS — I10 Essential (primary) hypertension: Secondary | ICD-10-CM | POA: Diagnosis not present

## 2014-07-03 DIAGNOSIS — I5030 Unspecified diastolic (congestive) heart failure: Secondary | ICD-10-CM | POA: Diagnosis not present

## 2014-07-03 DIAGNOSIS — M1990 Unspecified osteoarthritis, unspecified site: Secondary | ICD-10-CM | POA: Diagnosis not present

## 2014-07-03 DIAGNOSIS — K219 Gastro-esophageal reflux disease without esophagitis: Secondary | ICD-10-CM | POA: Diagnosis not present

## 2014-07-03 DIAGNOSIS — F0390 Unspecified dementia without behavioral disturbance: Secondary | ICD-10-CM | POA: Diagnosis not present

## 2014-07-03 DIAGNOSIS — R21 Rash and other nonspecific skin eruption: Secondary | ICD-10-CM | POA: Diagnosis not present

## 2014-08-14 DIAGNOSIS — N3020 Other chronic cystitis without hematuria: Secondary | ICD-10-CM | POA: Diagnosis not present

## 2014-08-14 DIAGNOSIS — R3989 Other symptoms and signs involving the genitourinary system: Secondary | ICD-10-CM | POA: Diagnosis not present

## 2014-08-14 DIAGNOSIS — R3915 Urgency of urination: Secondary | ICD-10-CM | POA: Diagnosis not present

## 2014-10-09 DIAGNOSIS — M1712 Unilateral primary osteoarthritis, left knee: Secondary | ICD-10-CM | POA: Diagnosis not present

## 2014-10-30 DIAGNOSIS — R05 Cough: Secondary | ICD-10-CM | POA: Diagnosis not present

## 2015-01-03 DIAGNOSIS — F0390 Unspecified dementia without behavioral disturbance: Secondary | ICD-10-CM | POA: Diagnosis not present

## 2015-01-03 DIAGNOSIS — I509 Heart failure, unspecified: Secondary | ICD-10-CM | POA: Diagnosis not present

## 2015-01-03 DIAGNOSIS — M170 Bilateral primary osteoarthritis of knee: Secondary | ICD-10-CM | POA: Diagnosis not present

## 2015-01-03 DIAGNOSIS — R262 Difficulty in walking, not elsewhere classified: Secondary | ICD-10-CM | POA: Diagnosis not present

## 2015-01-08 DIAGNOSIS — R0600 Dyspnea, unspecified: Secondary | ICD-10-CM | POA: Diagnosis not present

## 2015-01-08 DIAGNOSIS — R001 Bradycardia, unspecified: Secondary | ICD-10-CM | POA: Diagnosis not present

## 2015-01-10 ENCOUNTER — Emergency Department (HOSPITAL_COMMUNITY): Payer: Medicare Other

## 2015-01-10 ENCOUNTER — Encounter (HOSPITAL_COMMUNITY): Payer: Self-pay | Admitting: Emergency Medicine

## 2015-01-10 ENCOUNTER — Inpatient Hospital Stay (HOSPITAL_COMMUNITY)
Admission: EM | Admit: 2015-01-10 | Discharge: 2015-01-12 | DRG: 312 | Disposition: A | Discharge status: Home Health | Payer: Medicare Other | Attending: Family Medicine | Admitting: Family Medicine

## 2015-01-10 DIAGNOSIS — Z886 Allergy status to analgesic agent status: Secondary | ICD-10-CM

## 2015-01-10 DIAGNOSIS — I495 Sick sinus syndrome: Secondary | ICD-10-CM | POA: Diagnosis not present

## 2015-01-10 DIAGNOSIS — M1610 Unilateral primary osteoarthritis, unspecified hip: Secondary | ICD-10-CM | POA: Diagnosis not present

## 2015-01-10 DIAGNOSIS — I5032 Chronic diastolic (congestive) heart failure: Secondary | ICD-10-CM | POA: Diagnosis not present

## 2015-01-10 DIAGNOSIS — M479 Spondylosis, unspecified: Secondary | ICD-10-CM | POA: Diagnosis present

## 2015-01-10 DIAGNOSIS — M79662 Pain in left lower leg: Secondary | ICD-10-CM | POA: Diagnosis present

## 2015-01-10 DIAGNOSIS — I110 Hypertensive heart disease with heart failure: Secondary | ICD-10-CM | POA: Diagnosis present

## 2015-01-10 DIAGNOSIS — H9190 Unspecified hearing loss, unspecified ear: Secondary | ICD-10-CM | POA: Diagnosis present

## 2015-01-10 DIAGNOSIS — R079 Chest pain, unspecified: Secondary | ICD-10-CM | POA: Diagnosis not present

## 2015-01-10 DIAGNOSIS — R0602 Shortness of breath: Secondary | ICD-10-CM

## 2015-01-10 DIAGNOSIS — Z87891 Personal history of nicotine dependence: Secondary | ICD-10-CM | POA: Diagnosis not present

## 2015-01-10 DIAGNOSIS — R911 Solitary pulmonary nodule: Secondary | ICD-10-CM | POA: Diagnosis present

## 2015-01-10 DIAGNOSIS — F0390 Unspecified dementia without behavioral disturbance: Secondary | ICD-10-CM | POA: Diagnosis present

## 2015-01-10 DIAGNOSIS — E785 Hyperlipidemia, unspecified: Secondary | ICD-10-CM | POA: Diagnosis not present

## 2015-01-10 DIAGNOSIS — I2510 Atherosclerotic heart disease of native coronary artery without angina pectoris: Secondary | ICD-10-CM | POA: Diagnosis present

## 2015-01-10 DIAGNOSIS — D72829 Elevated white blood cell count, unspecified: Secondary | ICD-10-CM

## 2015-01-10 DIAGNOSIS — R55 Syncope and collapse: Secondary | ICD-10-CM | POA: Diagnosis present

## 2015-01-10 DIAGNOSIS — Z853 Personal history of malignant neoplasm of breast: Secondary | ICD-10-CM

## 2015-01-10 DIAGNOSIS — Z923 Personal history of irradiation: Secondary | ICD-10-CM

## 2015-01-10 DIAGNOSIS — Z91041 Radiographic dye allergy status: Secondary | ICD-10-CM

## 2015-01-10 DIAGNOSIS — I951 Orthostatic hypotension: Secondary | ICD-10-CM | POA: Diagnosis not present

## 2015-01-10 DIAGNOSIS — Z8674 Personal history of sudden cardiac arrest: Secondary | ICD-10-CM | POA: Diagnosis not present

## 2015-01-10 DIAGNOSIS — I480 Paroxysmal atrial fibrillation: Secondary | ICD-10-CM | POA: Diagnosis present

## 2015-01-10 DIAGNOSIS — K219 Gastro-esophageal reflux disease without esophagitis: Secondary | ICD-10-CM | POA: Diagnosis not present

## 2015-01-10 DIAGNOSIS — R0789 Other chest pain: Secondary | ICD-10-CM | POA: Diagnosis not present

## 2015-01-10 DIAGNOSIS — R0600 Dyspnea, unspecified: Secondary | ICD-10-CM | POA: Diagnosis present

## 2015-01-10 DIAGNOSIS — Z66 Do not resuscitate: Secondary | ICD-10-CM | POA: Diagnosis present

## 2015-01-10 DIAGNOSIS — M542 Cervicalgia: Secondary | ICD-10-CM | POA: Diagnosis present

## 2015-01-10 DIAGNOSIS — Z7982 Long term (current) use of aspirin: Secondary | ICD-10-CM

## 2015-01-10 DIAGNOSIS — Z8249 Family history of ischemic heart disease and other diseases of the circulatory system: Secondary | ICD-10-CM

## 2015-01-10 DIAGNOSIS — E875 Hyperkalemia: Secondary | ICD-10-CM | POA: Diagnosis present

## 2015-01-10 LAB — BRAIN NATRIURETIC PEPTIDE: B Natriuretic Peptide: 219 pg/mL — ABNORMAL HIGH (ref 0.0–100.0)

## 2015-01-10 LAB — URINALYSIS, ROUTINE W REFLEX MICROSCOPIC
Bilirubin Urine: NEGATIVE
GLUCOSE, UA: NEGATIVE mg/dL
Hgb urine dipstick: NEGATIVE
KETONES UR: NEGATIVE mg/dL
LEUKOCYTES UA: NEGATIVE
NITRITE: NEGATIVE
PH: 7 (ref 5.0–8.0)
Protein, ur: NEGATIVE mg/dL
SPECIFIC GRAVITY, URINE: 1.008 (ref 1.005–1.030)
Urobilinogen, UA: 0.2 mg/dL (ref 0.0–1.0)

## 2015-01-10 LAB — I-STAT CHEM 8, ED
BUN: 22 mg/dL — AB (ref 6–20)
BUN: 30 mg/dL — AB (ref 6–20)
CREATININE: 1.2 mg/dL — AB (ref 0.44–1.00)
CREATININE: 1.2 mg/dL — AB (ref 0.44–1.00)
Calcium, Ion: 1.04 mmol/L — ABNORMAL LOW (ref 1.13–1.30)
Calcium, Ion: 1.08 mmol/L — ABNORMAL LOW (ref 1.13–1.30)
Chloride: 103 mmol/L (ref 101–111)
Chloride: 104 mmol/L (ref 101–111)
GLUCOSE: 101 mg/dL — AB (ref 65–99)
GLUCOSE: 124 mg/dL — AB (ref 65–99)
HCT: 42 % (ref 36.0–46.0)
HEMATOCRIT: 41 % (ref 36.0–46.0)
HEMOGLOBIN: 13.9 g/dL (ref 12.0–15.0)
HEMOGLOBIN: 14.3 g/dL (ref 12.0–15.0)
POTASSIUM: 3.8 mmol/L (ref 3.5–5.1)
Potassium: 6.3 mmol/L (ref 3.5–5.1)
Sodium: 138 mmol/L (ref 135–145)
Sodium: 141 mmol/L (ref 135–145)
TCO2: 25 mmol/L (ref 0–100)
TCO2: 29 mmol/L (ref 0–100)

## 2015-01-10 LAB — CBC WITH DIFFERENTIAL/PLATELET
Basophils Absolute: 0 10*3/uL (ref 0.0–0.1)
Basophils Relative: 0 %
EOS PCT: 1 %
Eosinophils Absolute: 0.2 10*3/uL (ref 0.0–0.7)
HEMATOCRIT: 40.6 % (ref 36.0–46.0)
Hemoglobin: 13.8 g/dL (ref 12.0–15.0)
LYMPHS ABS: 0.5 10*3/uL — AB (ref 0.7–4.0)
LYMPHS PCT: 4 %
MCH: 30.6 pg (ref 26.0–34.0)
MCHC: 34 g/dL (ref 30.0–36.0)
MCV: 90 fL (ref 78.0–100.0)
MONO ABS: 0.4 10*3/uL (ref 0.1–1.0)
MONOS PCT: 4 %
NEUTROS ABS: 9.5 10*3/uL — AB (ref 1.7–7.7)
Neutrophils Relative %: 91 %
PLATELETS: 185 10*3/uL (ref 150–400)
RBC: 4.51 MIL/uL (ref 3.87–5.11)
RDW: 12.7 % (ref 11.5–15.5)
WBC: 10.6 10*3/uL — ABNORMAL HIGH (ref 4.0–10.5)

## 2015-01-10 LAB — COMPREHENSIVE METABOLIC PANEL
ALT: 21 U/L (ref 14–54)
ANION GAP: 6 (ref 5–15)
AST: 71 U/L — AB (ref 15–41)
Albumin: 3.4 g/dL — ABNORMAL LOW (ref 3.5–5.0)
Alkaline Phosphatase: 68 U/L (ref 38–126)
BILIRUBIN TOTAL: 2.1 mg/dL — AB (ref 0.3–1.2)
BUN: 16 mg/dL (ref 6–20)
CALCIUM: 8.9 mg/dL (ref 8.9–10.3)
CHLORIDE: 104 mmol/L (ref 101–111)
CO2: 28 mmol/L (ref 22–32)
CREATININE: 1.1 mg/dL — AB (ref 0.44–1.00)
GFR, EST AFRICAN AMERICAN: 52 mL/min — AB (ref >60)
GFR, EST NON AFRICAN AMERICAN: 44 mL/min — AB (ref >60)
GLUCOSE: 136 mg/dL — AB (ref 65–99)
Potassium: 6.5 mmol/L (ref 3.5–5.1)
Sodium: 138 mmol/L (ref 135–145)
Total Protein: 6.2 g/dL — ABNORMAL LOW (ref 6.5–8.1)

## 2015-01-10 LAB — TSH: TSH: 1.346 u[IU]/mL (ref 0.350–4.500)

## 2015-01-10 LAB — D-DIMER, QUANTITATIVE: D-Dimer, Quant: 0.66 ug/mL-FEU — ABNORMAL HIGH (ref 0.00–0.48)

## 2015-01-10 LAB — TROPONIN I: Troponin I: <0.03 ng/mL (ref <0.031)

## 2015-01-10 MED ORDER — SODIUM CHLORIDE 0.9 % IJ SOLN
3.0000 mL | Freq: Two times a day (BID) | INTRAMUSCULAR | Status: DC
  Administered 2015-01-10 – 2015-01-12 (×3): 3 mL via INTRAVENOUS

## 2015-01-10 MED ORDER — LISINOPRIL 2.5 MG PO TABS
2.5000 mg | ORAL_TABLET | Freq: Every day | ORAL | Status: DC
Start: 2015-01-11 — End: 2015-01-11
  Filled 2015-01-10: qty 1

## 2015-01-10 MED ORDER — POTASSIUM CHLORIDE CRYS ER 20 MEQ PO TBCR
20.0000 meq | EXTENDED_RELEASE_TABLET | Freq: Two times a day (BID) | ORAL | Status: DC
  Administered 2015-01-10 – 2015-01-12 (×4): 20 meq via ORAL
  Filled 2015-01-10 (×4): qty 1

## 2015-01-10 MED ORDER — ASPIRIN EC 81 MG PO TBEC
81.0000 mg | DELAYED_RELEASE_TABLET | Freq: Every day | ORAL | Status: DC
  Administered 2015-01-11 – 2015-01-12 (×2): 81 mg via ORAL
  Filled 2015-01-10 (×2): qty 1

## 2015-01-10 MED ORDER — PANTOPRAZOLE SODIUM 40 MG PO TBEC
40.0000 mg | DELAYED_RELEASE_TABLET | Freq: Every day | ORAL | Status: DC
  Administered 2015-01-11 – 2015-01-12 (×2): 40 mg via ORAL
  Filled 2015-01-10 (×2): qty 1

## 2015-01-10 MED ORDER — ACETAMINOPHEN 650 MG RE SUPP: 650.0000 mg | Freq: Four times a day (QID) | RECTAL | Status: DC | PRN

## 2015-01-10 MED ORDER — IPRATROPIUM-ALBUTEROL 0.5-2.5 (3) MG/3ML IN SOLN: 3.0000 mL | RESPIRATORY_TRACT | Status: DC | PRN

## 2015-01-10 MED ORDER — ENOXAPARIN SODIUM 30 MG/0.3ML ~~LOC~~ SOLN
30.0000 mg | SUBCUTANEOUS | Status: DC
  Administered 2015-01-10: 30 mg via SUBCUTANEOUS
  Filled 2015-01-10: qty 0.3

## 2015-01-10 MED ORDER — FUROSEMIDE 40 MG PO TABS
40.0000 mg | ORAL_TABLET | Freq: Two times a day (BID) | ORAL | Status: DC
  Administered 2015-01-10 – 2015-01-12 (×4): 40 mg via ORAL
  Filled 2015-01-10 (×4): qty 1

## 2015-01-10 MED ORDER — ACETAMINOPHEN 325 MG PO TABS: 650.0000 mg | ORAL_TABLET | Freq: Four times a day (QID) | ORAL | Status: DC | PRN

## 2015-01-10 MED ORDER — METOPROLOL SUCCINATE ER 50 MG PO TB24
50.0000 mg | ORAL_TABLET | Freq: Every day | ORAL | Status: DC
  Administered 2015-01-11 – 2015-01-12 (×2): 50 mg via ORAL
  Filled 2015-01-10 (×2): qty 1

## 2015-01-10 MED ORDER — SODIUM CHLORIDE 0.9 % IV SOLN
1.0000 g | Freq: Once | INTRAVENOUS | Status: AC
  Administered 2015-01-10: 1 g via INTRAVENOUS
  Filled 2015-01-10: qty 10

## 2015-01-10 NOTE — ED Notes (Signed)
Pt ambulated to restroom with assistance. Steady gait. No chest pain or sob.

## 2015-01-10 NOTE — ED Notes (Signed)
Admitting at bedside 

## 2015-01-10 NOTE — ED Notes (Signed)
MD at bedside.[place]

## 2015-01-10 NOTE — H&P (Signed)
[facility] History and Physical  Alexis Winters:[id number] DOB: Mar 24, 1928 DOA: 01/10/2015  Referring physician: ED PCP: Thamas Jaegers, NP   Chief Complaint: shortness of breath almost passing out  HPI:  Patient is a 81 year old female with a past medical history significant for diastolic congestive heart failure, atrial fibrillation, infiltrating ductal carcinoma, cardiac arrest following a cardiac cath, and dementia; who presents after 2 day history of difficulty breathing. History is provided by the patient's daughter. Patient went with her daughter to department store and reported significant shortness of breath despite not traveling significant distance. Nurse unable to quantify how poor her mother traveled before the symptoms occurred. She reported taking her mother back to the car which point she had a near syncopal event and her legs went weak. She reports that her mother appeared very pale and sweaty, but recovered within a few minutes. She never lost consciousness, loss of bowel or bladder, or hit her head. The patient's daughter made a phone call her PCP Dr. Lorenda Hatchet, who instructed them to go to the emergency department if were to ever happen again as she may need to be evaluated for a PE. Last night the patient reported difficulty sleeping because of a funny feeling in her chest. She was seen this morning to have similar symptoms almost at rest therefore the daughter brought her to the emergency department. It appears she previously was evaluated for possibly having signs of SVT almost a year ago. Furthermore the family notes that in recent months the medical alert company called house to check on her mother as it picked up some abnormal activity. Patient reports also filling associated symptoms of shakiness and dizziness with episodes. Denies fever, chills, diarrhea, nausea, vomiting, and or cough.   Review of Systems: A complete review of systems was performed and negative  except for as noted above in history of present illness    Past Medical History  Diagnosis Date  . Chronic diastolic CHF (congestive heart failure) ([place])     a. echo 09/12/13 EF 81-19%, grade 1 diastolic dysf, mild LVOT obstr, moderate MR, elevated filling pressure  . Hypoglycemia   . Hard of hearing   . Phlebitis     l leg  . Hypertension   . Frequent UTI   . Enlarged liver   . Hemorrhoids   . Dyslipidemia   . OA (osteoarthritis)     hip and back  . Atrial fibrillation ([place])     a. h/o PAF, in NSR during admission 09/11/13  . Allergic rhinitis   . GERD (gastroesophageal reflux disease)   . Prediabetes   . Arrhythmia     a. office to call pt on 09/15/13 for 30 day event monitor  . Breast cancer ([place])   . Mitral regurgitation     a. moderate MR by echo 09/12/2013     Past Surgical History  Procedure Laterality Date  . Abdominal hysterectomy    . Bladder suspension    . Breast lumpectomy    . Foot surgery      left foot  . Inner ear surgery    . Cardiac catheterization  1980's    [facility]      Social History:  reports that she has quit smoking. She has never used smokeless tobacco. She reports that she does not drink alcohol or use illicit drugs. Where does patient live--home alone but family nearby patient with life alert bracelet and follow bracelet  Can patient participate in ADLs? Yes  Allergies  Allergen Reactions  . Contrast Media [Iodinated Diagnostic Agents] Other (See Comments)    Cardiac arrest  . Pyridium [Phenazopyridine Hcl] Swelling    unknown    Family History  Problem Relation Age of Onset  . Cerebral palsy Sister   . Alzheimer's disease Brother   . Hypertension Father   . Heart disease Brother       FAMILY HISTORY  When questioned  Directly-patient reports  No family history of HTN, CVA ,DIABETES, TB, Cancer CAD, Bleeding Disorders, Sickle Cell, diabetes, anemia, asthma,   Prior to Admission medications   Medication Sig Start Date End Date  Taking? Authorizing Provider  aspirin EC 81 MG tablet Take 81 mg by mouth daily.   Yes Historical Provider, MD  furosemide (LASIX) 40 MG tablet Take 40 mg by mouth 2 (two) times daily.  04/09/12  Yes Historical Provider, MD  KLOR-CON M20 20 MEQ tablet Take 20 mEq by mouth 2 (two) times daily.  03/23/12  Yes Historical Provider, MD  lisinopril (PRINIVIL,ZESTRIL) 2.5 MG tablet Take 2.5 mg by mouth daily. 12/16/14  Yes Historical Provider, MD  pantoprazole (PROTONIX) 40 MG tablet Take 40 mg by mouth daily. 10/19/14  Yes Historical Provider, MD  TOPROL XL 50 MG 24 hr tablet Take 50 mg by mouth daily.  01/29/12  Yes Historical Provider, MD     Physical Exam: Filed Vitals:   01/10/15 1300 01/10/15 1330 01/10/15 1430 01/10/15 1549  BP: 93/45 105/41 112/41 109/35  Pulse: 71 76 72 71  Temp:    98.3 F (36.8 C)  TempSrc:    Oral  Resp: 17 17 25 16   Height:    4\' 11"  (1.499 m)  Weight:    61.2 kg (134 lb 14.7 oz)  SpO2: 96% 96% 97% 99%     Constitutional: Vital signs reviewed. Patient is a well-developed and well-nourished in no acute distress and cooperative with exam. Alert and oriented to person.  Head: Normocephalic and atraumatic  Ear: TM normal bilaterally  Mouth: no erythema or exudates, MMM  Eyes: PERRL, EOMI, conjunctivae normal, No scleral icterus.  Neck: Supple, Trachea midline normal ROM, No JVD, mass, thyromegaly, or carotid bruit present.  Cardiovascular: RRR, S1 normal, S2 normal, no MRG, pulses symmetric and intact bilaterally  Pulmonary/Chest: CTAB, no wheezes, rales, or rhonchi  Abdominal: Soft. Non-tender, non-distended, bowel sounds are normal, no masses, organomegaly, or guarding present.  Musculoskeletal: No joint deformities, erythema, or stiffness, ROM full and no nontender Ext: +1 pitting edema on the left lower extremity. No cyanosis, pulses palpable bilaterally (DP and PT). Patient complains of pain in the calf with Dorsiflexion of the left foot. Hematology: no  cervical, inginal, or axillary adenopathy.  Neurological: Alert and oriented, Strenght is normal and symmetric bilaterally, cranial nerve II-XII are grossly intact, no focal motor deficit, sensory intact to light touch bilaterally.  Skin: Warm, dry and intact. No rash, cyanosis, or clubbing.  Psychiatric:  Decreased recent memory with mild confusion     Data Review   Micro Results No results found for this or any previous visit (from the past 240 hour(s)).  Radiology Reports Dg Chest 2 View  01/10/2015  CLINICAL DATA:  Shortness of breath starting 1 week ago EXAM: CHEST  2 VIEW COMPARISON:  09/11/2013 FINDINGS: Cardiomediastinal silhouette is stable. No acute infiltrate or pleural effusion. No pulmonary edema. Accessory azygos fissure/ lobe again noted. Stable calcified granuloma in right upper lobe. There is 7 mm nodule in right lower lobe laterally.  Further correlation with nonemergent CT scan of the chest is recommended. Mild degenerative changes thoracolumbar spine. No pulmonary edema. IMPRESSION: No active disease. Stable calcified granuloma in right upper lobe. There is 7 mm nodule in right lower lobe laterally. Further correlation with nonemergent CT scan of the chest is recommended. Electronically Signed   By: Lahoma Crocker M.D.   On: 01/10/2015 12:23     CBC  Recent Labs Lab 01/10/15 1122 01/10/15 1249 01/10/15 1314  WBC 10.6*  --   --   HGB 13.8 14.3 13.9  HCT 40.6 42.0 41.0  PLT 185  --   --   MCV 90.0  --   --   MCH 30.6  --   --   MCHC 34.0  --   --   RDW 12.7  --   --   LYMPHSABS 0.5*  --   --   MONOABS 0.4  --   --   EOSABS 0.2  --   --   BASOSABS 0.0  --   --     Chemistries   Recent Labs Lab 01/10/15 1122 01/10/15 1249 01/10/15 1314  NA 138 138 141  K 6.5* 6.3* 3.8  CL 104 103 104  CO2 28  --   --   GLUCOSE 136* 124* 101*  BUN 16 30* 22*  CREATININE 1.10* 1.20* 1.20*  CALCIUM 8.9  --   --   AST 71*  --   --   ALT 21  --   --   ALKPHOS 68  --   --    BILITOT 2.1*  --   --    ------------------------------------------------------------------------------------------------------------------ estimated creatinine clearance is 27.3 mL/min (by C-G formula based on Cr of 1.2). ------------------------------------------------------------------------------------------------------------------ No results for input(s): HGBA1C in the last 72 hours. ------------------------------------------------------------------------------------------------------------------ No results for input(s): CHOL, HDL, LDLCALC, TRIG, CHOLHDL, LDLDIRECT in the last 72 hours. ------------------------------------------------------------------------------------------------------------------ No results for input(s): TSH, T4TOTAL, T3FREE, THYROIDAB in the last 72 hours.  Invalid input(s): FREET3 ------------------------------------------------------------------------------------------------------------------ No results for input(s): VITAMINB12, FOLATE, FERRITIN, TIBC, IRON, RETICCTPCT in the last 72 hours.  Coagulation profile No results for input(s): INR, PROTIME in the last 168 hours.   Recent Labs  01/10/15 1122  DDIMER 0.66*    Cardiac Enzymes  Recent Labs Lab 01/10/15 1122  TROPONINI <0.03   ------------------------------------------------------------------------------------------------------------------ Invalid input(s): POCBNP   CBG: No results for input(s): GLUCAP in the last 168 hours.     EKG: Independently reviewed. Sinus rhythm with a normal R-wave transition section   Assessment/Plan Principal Problem:     Near syncope: Question of patient's recent history of symptoms and findings of shortness of breath, left calf pain, elevation in the d-dimer 0.66 and decreased kidney function. Suspect the possibility DVT/PE with also known previous history of malignancy. Other causes for this near syncopal event include cardiac versus Infectious. -check  VQ scan -Admitting patient for observation to telemetry -Orthostatic vitals - Check TSH  Dyspnea: Chest x-ray appears to be clear except for signs of a pulmonary nodule. Suspect possibility of patient shortness of breath could be cardiac in nature. But on the differential not ruling out pulmonary edema versus deconditioning. -Check influenza respiratory panel -[place] as needed -Physical therapy to eval and treat  Paroxysmal atrial fibrillation ([place]): Currently in sinus rhythm on Ekg CHA2DSVAS2=6 . Patient seen to have multiple PVCs on the monitor in the emergency department. Question of possibility of SVT -Will need to review telemetry overnight for arrythmias  Chronic diastolic CHF (congestive heart failure) (  HCC):stable. last EF 65-70% ejection fraction back in 11/2014 -Repeat echocardiogram -Continue home meds of metoprolol XL, lisinopril   Neck pain on left side. Chronic -prn tylenol    Pulmonary nodule, right: Acute finding on chest x-ray seen to be 50mm in size. Given previous history of malignancy this could be a recurrence. -Would discuss with family if they want further work this including high-resolution CT   Leukocytosis: Mild elevation of the white blood cell count elevated at 10.6. Unclear etiology -Repeat CBC in a.m.  Hypocalcemia: Ionized calcium was seen to be low at 1.04. Replaced with 1 g of IV calcium gluconate -Check an ionized calcium in a.m.  Hyperkalemia: Resolved. Patient initial blood draw hemolyzed which led to the falsely elevated potassium level of 6.2 however when potassium levels were rechecked on admission that same blood was used. Lab work showing a normal potassium level at 13:14 is corrected 3.8    History of breast cancer   GERD:Stable -continue protonix  DVT prophylaxis: lovenox  Code Status:   DO NOT RESUSCITATE  Family Communication: Discussed overall plan with daughter to admit for observation for further workup Disposition Plan: admit   Total  time spent 55 minutes.Greater than 50% of this time was spent in counseling, explanation of diagnosis, planning of further management, and coordination of care  [facility] Pager [phone]  If 7PM-7AM, please contact night-coverage [email or web address] Password [email or web address] 01/10/2015, 4:59 PM

## 2015-01-10 NOTE — ED Provider Notes (Signed)
CSN: [id number]     Arrival date & time 01/10/15  1042 History   First MD Initiated Contact with Patient 01/10/15 1048     Chief Complaint  Patient presents with  . Shortness of Breath     (Consider location/radiation/quality/duration/timing/severity/associated sxs/prior Treatment) HPI Comments: Patient is an 81 year old female with past medical history of CHF, hypertension, atrial fibrillation. She presents today for evaluation of difficulty breathing. She reports feeling tight in the chest and having episodic shortness of breath it seems to occur with exertion for the past several days. This morning the symptoms began one she was lying in bed. She denies any fevers, chills, or cough.  Patient is a 81 y.o. female presenting with shortness of breath. The history is provided by the patient.  Shortness of Breath Severity:  Moderate Onset quality:  Sudden Duration:  3 days Timing:  Intermittent Progression:  Worsening Chronicity:  New Context: activity   Relieved by:  Nothing Worsened by:  Nothing tried Ineffective treatments:  None tried Associated symptoms: chest pain   Associated symptoms: no cough and no fever     Past Medical History  Diagnosis Date  . Chronic diastolic CHF (congestive heart failure) ([place])     a. echo 09/12/13 EF 71-69%, grade 1 diastolic dysf, mild LVOT obstr, moderate MR, elevated filling pressure  . Hypoglycemia   . Hard of hearing   . Phlebitis     l leg  . Hypertension   . Frequent UTI   . Enlarged liver   . Hemorrhoids   . Dyslipidemia   . OA (osteoarthritis)     hip and back  . Atrial fibrillation ([place])     a. h/o PAF, in NSR during admission 09/11/13  . Allergic rhinitis   . GERD (gastroesophageal reflux disease)   . Prediabetes   . Arrhythmia     a. office to call pt on 09/15/13 for 30 day event monitor  . Breast cancer ([place])   . Mitral regurgitation     a. moderate MR by echo 09/12/2013   Past Surgical History  Procedure Laterality Date   . Abdominal hysterectomy    . Bladder suspension    . Breast lumpectomy    . Foot surgery      left foot  . Inner ear surgery    . Cardiac catheterization  95's    [facility]   Family History  Problem Relation Age of Onset  . Cerebral palsy Sister   . Alzheimer's disease Brother   . Hypertension Father   . Heart disease Brother    Social History  Substance Use Topics  . Smoking status: Former Research scientist (life sciences)  . Smokeless tobacco: Never Used  . Alcohol Use: No   OB History    No data available     Review of Systems  Constitutional: Negative for fever.  Respiratory: Positive for shortness of breath. Negative for cough.   Cardiovascular: Positive for chest pain.  All other systems reviewed and are negative.     Allergies  Contrast media and Pyridium  Home Medications   Prior to Admission medications   Medication Sig Start Date End Date Taking? Authorizing Provider  aspirin EC 81 MG tablet Take 81 mg by mouth daily.    Historical Provider, MD  diphenoxylate-atropine (LOMOTIL) 2.5-0.025 MG per tablet Take 1 tablet by mouth daily as needed for diarrhea or loose stools.     Historical Provider, MD  docusate sodium (COLACE) 100 MG capsule Take 100 mg by mouth daily  as needed for mild constipation.     Historical Provider, MD  furosemide (LASIX) 40 MG tablet Take 40 mg by mouth 2 (two) times daily.  04/09/12   Historical Provider, MD  KLOR-CON M20 20 MEQ tablet Take 20 mEq by mouth 2 (two) times daily.  03/23/12   Historical Provider, MD  Methen-Hyosc-Meth Blue-Na Phos (URO-BLUE) 81.6 MG TABS as needed.  10/06/13   Historical Provider, MD  Methen-Hyosc-Meth Blue-Na Phos (UROGESIC-BLUE) 81.6 MG TABS Take 1 tablet by mouth daily as needed.     Historical Provider, MD  Multiple Vitamin (MULTIVITAMIN) tablet Take 1 tablet by mouth daily.    Historical Provider, MD  TOPROL XL 50 MG 24 hr tablet Take 75 mg by mouth daily.  01/29/12   Historical Provider, MD  vitamin B-12 (CYANOCOBALAMIN) 1000 MCG  tablet Take 1,000 mcg by mouth daily.    Historical Provider, MD   BP 106/41 mmHg  Pulse 74  Temp(Src) 97.9 F (36.6 C) (Oral)  Resp 28  Ht 4\' 11"  (1.499 m)  Wt 126 lb (57.153 kg)  BMI 25.44 kg/m2  SpO2 96% Physical Exam  Constitutional: She is oriented to person, place, and time. She appears well-developed and well-nourished. No distress.  HENT:  Head: Normocephalic and atraumatic.  Neck: Normal range of motion. Neck supple.  Cardiovascular: Normal rate and regular rhythm.  Exam reveals no gallop and no friction rub.   No murmur heard. Pulmonary/Chest: Effort normal and breath sounds normal. No respiratory distress. She has no wheezes.  Abdominal: Soft. Bowel sounds are normal. She exhibits no distension. There is no tenderness.  Musculoskeletal: Normal range of motion. She exhibits edema.  There is 2-3+ pitting edema of the bilateral lower extremities.  Neurological: She is alert and oriented to person, place, and time.  Skin: Skin is warm and dry. She is not diaphoretic.  Nursing note and vitals reviewed.   ED Course  Procedures (including critical care time) Labs Review Labs Reviewed - No data to display  Imaging Review No results found. I have personally reviewed and evaluated these images and lab results as part of my medical decision-making.   EKG Interpretation   Date/Time:  Sunday January 10 2015 10:46:36 EDT Ventricular Rate:  74 PR Interval:  203 QRS Duration: 66 QT Interval:  369 QTC Calculation: 409 R Axis:   40 Text Interpretation:  Sinus rhythm Abnormal R-wave progression, early  transition Confirmed by Halbert Jesson  MD, Jaylee Freeze ([id number]) on 01/10/2015 10:49:27  AM      MDM   Final diagnoses:  None    Patient presents with complaints of difficulty breathing and chest discomfort that occurred episodically for the past several days. She was referred here by her primary Dr. to rule out a blood clot. Her EKG and laboratory studies are essentially  unremarkable. She does have a slightly elevated BNP and slightly elevated d-dimer. She has an allergy to IV contrast, a CT scan is unable to be obtained. I feel as though admission for observation and possible VQ scan in the morning as appropriate. I've discussed this with Dr. Tamala Julian from the hospitalist service who agrees to admit.    Veryl Speak, MD 01/10/15 [phone]

## 2015-01-10 NOTE — ED Notes (Signed)
Report attempted x 1

## 2015-01-10 NOTE — ED Notes (Signed)
Daughter states pt has been having been episodes of shortness of breath while walking that started over 1 week ago. Pt has seen PCP and having work up to rule out blood clot with labs. Pt woke up this am and walked into the kitchen and stated she couldn't breath and appeared very pale to daughter that she lives with. Pt also c/o of left sided chest pain this am. Pt denies any pain at this time but also has history of dementia. Pt is warm and dry. Alert and oriented to person.

## 2015-01-10 NOTE — ED Notes (Signed)
PT placed in gown and in bed. Pt monitored by pulse ox, bp cuff, and 12-lead. 

## 2015-01-11 ENCOUNTER — Observation Stay (HOSPITAL_COMMUNITY): Payer: Medicare Other

## 2015-01-11 ENCOUNTER — Ambulatory Visit (HOSPITAL_BASED_OUTPATIENT_CLINIC_OR_DEPARTMENT_OTHER): Payer: Medicare Other

## 2015-01-11 ENCOUNTER — Ambulatory Visit (HOSPITAL_COMMUNITY): Payer: Medicare Other

## 2015-01-11 DIAGNOSIS — R55 Syncope and collapse: Secondary | ICD-10-CM | POA: Diagnosis not present

## 2015-01-11 DIAGNOSIS — R0602 Shortness of breath: Secondary | ICD-10-CM

## 2015-01-11 LAB — INFLUENZA PANEL BY PCR (TYPE A & B)
H1N1FLUPCR: NOT DETECTED
INFLAPCR: NEGATIVE
INFLBPCR: NEGATIVE

## 2015-01-11 LAB — COMPREHENSIVE METABOLIC PANEL
ALT: 14 U/L (ref 14–54)
AST: 23 U/L (ref 15–41)
Albumin: 3.4 g/dL — ABNORMAL LOW (ref 3.5–5.0)
Alkaline Phosphatase: 63 U/L (ref 38–126)
Anion gap: 9 (ref 5–15)
BILIRUBIN TOTAL: 1.2 mg/dL (ref 0.3–1.2)
BUN: 15 mg/dL (ref 6–20)
CHLORIDE: 105 mmol/L (ref 101–111)
CO2: 28 mmol/L (ref 22–32)
Calcium: 9 mg/dL (ref 8.9–10.3)
Creatinine, Ser: 1.16 mg/dL — ABNORMAL HIGH (ref 0.44–1.00)
GFR, EST AFRICAN AMERICAN: 48 mL/min — AB (ref >60)
GFR, EST NON AFRICAN AMERICAN: 42 mL/min — AB (ref >60)
Glucose, Bld: 103 mg/dL — ABNORMAL HIGH (ref 65–99)
POTASSIUM: 3.9 mmol/L (ref 3.5–5.1)
Sodium: 142 mmol/L (ref 135–145)
TOTAL PROTEIN: 5.6 g/dL — AB (ref 6.5–8.1)

## 2015-01-11 LAB — POCT I-STAT, CHEM 8
BUN: 30 mg/dL — AB (ref 6–20)
CALCIUM ION: 1.08 mmol/L — AB (ref 1.13–1.30)
CHLORIDE: 103 mmol/L (ref 101–111)
Creatinine, Ser: 1.2 mg/dL — ABNORMAL HIGH (ref 0.44–1.00)
GLUCOSE: 124 mg/dL — AB (ref 65–99)
HCT: 42 % (ref 36.0–46.0)
Hemoglobin: 14.3 g/dL (ref 12.0–15.0)
POTASSIUM: 6.3 mmol/L — AB (ref 3.5–5.1)
SODIUM: 138 mmol/L (ref 135–145)
TCO2: 29 mmol/L (ref 0–100)

## 2015-01-11 LAB — CBC
HEMATOCRIT: 37.6 % (ref 36.0–46.0)
Hemoglobin: 12.5 g/dL (ref 12.0–15.0)
MCH: 30.1 pg (ref 26.0–34.0)
MCHC: 33.2 g/dL (ref 30.0–36.0)
MCV: 90.6 fL (ref 78.0–100.0)
PLATELETS: 140 10*3/uL — AB (ref 150–400)
RBC: 4.15 MIL/uL (ref 3.87–5.11)
RDW: 12.6 % (ref 11.5–15.5)
WBC: 5.7 10*3/uL (ref 4.0–10.5)

## 2015-01-11 LAB — MAGNESIUM: Magnesium: 2 mg/dL (ref 1.7–2.4)

## 2015-01-11 MED ORDER — TECHNETIUM TO 99M ALBUMIN AGGREGATED: 4.1000 | Freq: Once | INTRAVENOUS | Status: DC | PRN

## 2015-01-11 MED ORDER — CALCIUM CARBONATE ANTACID 500 MG PO CHEW
1.0000 | CHEWABLE_TABLET | Freq: Three times a day (TID) | ORAL | Status: DC
  Administered 2015-01-12: 200 mg via ORAL
  Filled 2015-01-11 (×2): qty 1

## 2015-01-11 MED ORDER — TECHNETIUM TC 99M DIETHYLENETRIAME-PENTAACETIC ACID: 30.5000 | Freq: Once | INTRAVENOUS | Status: DC | PRN

## 2015-01-11 MED ORDER — INFLUENZA VAC SPLIT QUAD 0.5 ML IM SUSY
0.5000 mL | PREFILLED_SYRINGE | INTRAMUSCULAR | Status: AC
  Administered 2015-01-12: 0.5 mL via INTRAMUSCULAR
  Filled 2015-01-11: qty 0.5

## 2015-01-11 MED ORDER — PNEUMOCOCCAL VAC POLYVALENT 25 MCG/0.5ML IJ INJ
0.5000 mL | INJECTION | INTRAMUSCULAR | Status: DC
  Filled 2015-01-11: qty 0.5

## 2015-01-11 NOTE — Evaluation (Signed)
Physical Therapy Evaluation Patient Details Name: Alexis Winters MRN: [id number] DOB: April 30, 1927 Today's Date: 01/11/2015   History of Present Illness  Pt is a 81 y/o F who presented after 2 days of difficulty breathing.  Previously evaluated for possible SVT.  Vascular lab w/ preliminary results neg for DVT on 01/11/15.  Cardiac following to determine etiology.  Pt's PMH includes chronic neck pain, CHF, HOH, OA of hip and back, afib, breast cancer, Lt foot surgery.  Clinical Impression  Pt admitted with above diagnosis. Pt currently with functional limitations due to the deficits listed below (see PT Problem List). Alexis Winters is not safe to return home alone and is most appropriate for d/c to memory care facility; however, if this is not an option at d/c she will need to go to SNF.  She requires min assist during sit<>stand and ambulation and is unsteady w/ all mobility. Pt will benefit from skilled PT to increase their independence and safety with mobility to allow discharge to the venue listed below.      Follow Up Recommendations SNF;Supervision/Assistance - 24 hour    Equipment Recommendations  None recommended by PT    Recommendations for Other Services       Precautions / Restrictions Precautions Precautions: Fall Precaution Comments: h/o dementia Required Braces or Orthoses: Other Brace/Splint Other Brace/Splint: Lt knee brace, per pt she is to wear it w/ ambulation Restrictions Weight Bearing Restrictions: No      Mobility  Bed Mobility Overal bed mobility: Needs Assistance Bed Mobility: Supine to Sit     Supine to sit: Min guard;HOB elevated     General bed mobility comments: HOB elevated and pt w/ increased time.    Transfers Overall transfer level: Needs assistance Equipment used: Rolling walker (2 wheeled) Transfers: Sit to/from Stand Sit to Stand: Min assist         General transfer comment: Min assist to stedy as pt demonstrates instability w/  sit<>stand.  Cues for hand placement.  Ambulation/Gait Ambulation/Gait assistance: Min assist Ambulation Distance (Feet): 50 Feet Assistive device: Rolling walker (2 wheeled) Gait Pattern/deviations: Step-through pattern;Decreased weight shift to left;Decreased stride length;Decreased stance time - left;Antalgic;Drifts right/left   Gait velocity interpretation: Below normal speed for age/gender General Gait Details: Min assist to steady RW and to navigate RW around objects in room.  Dec weight shift to Lt LE.  Cues to stand upright.  Stairs            Wheelchair Mobility    Modified Rankin (Stroke Patients Only)       Balance Overall balance assessment: Needs assistance Sitting-balance support: Bilateral upper extremity supported;Feet supported Sitting balance-Leahy Scale: Fair     Standing balance support: Bilateral upper extremity supported;During functional activity Standing balance-Leahy Scale: Poor Standing balance comment: Relies on RW and assist for stability                             Pertinent Vitals/Pain Pain Assessment: No/denies pain    Home Living Family/patient expects to be discharged to:: Skilled nursing facility Living Arrangements: Alone Available Help at Discharge: Family;Available PRN/intermittently Type of Home: House         Home Equipment: Walker - 4 wheels;Cane - single point (rollator) Additional Comments: Per pt she lives alone and down the street from her daughter (consistent w/ chart review)    Prior Function Level of Independence: Needs assistance   Gait / Transfers Assistance Needed: Per pt  she is ambulating in home w/ rollator w/o assist  ADL's / Homemaking Assistance Needed: Per pt, daughter assists her in morning and night w/ dressing and bathing.  Her daughter does all of the cooking and cleaning.          Hand Dominance        Extremity/Trunk Assessment   Upper Extremity Assessment: Generalized  weakness           Lower Extremity Assessment: Generalized weakness;RLE deficits/detail;LLE deficits/detail RLE Deficits / Details: edema Bil LEs LLE Deficits / Details: edema Bil LEs and Lt knee valgus.  Per pt "my doctor says my bone will break in half if I don't wear my knee brace"     Communication   Communication: HOH  Cognition Arousal/Alertness: Awake/alert Behavior During Therapy: WFL for tasks assessed/performed Overall Cognitive Status: History of cognitive impairments - at baseline (Pt A&O x 4)       Memory: Decreased short-term memory              General Comments General comments (skin integrity, edema, etc.): Per SW notes pt's daughter in search of memory care facility as pt's daughter says pt is not safe alone at home.  Recommending SNF if memory care facility not available at d/c as pt lives alone and is not safe 2/2 instability and dementia.    Exercises General Exercises - Lower Extremity Ankle Circles/Pumps: AROM;Both;10 reps;Seated Long Arc Quad: AROM;Both;10 reps;Seated Hip Flexion/Marching: AROM;Both;10 reps;Seated      Assessment/Plan    PT Assessment Patient needs continued PT services  PT Diagnosis Difficulty walking;Abnormality of gait;Generalized weakness   PT Problem List Decreased strength;Decreased range of motion;Decreased activity tolerance;Decreased balance;Decreased mobility;Decreased cognition;Decreased knowledge of use of DME;Decreased safety awareness;Decreased knowledge of precautions  PT Treatment Interventions DME instruction;Gait training;Functional mobility training;Therapeutic activities;Therapeutic exercise;Balance training;Neuromuscular re-education;Cognitive remediation;Patient/family education   PT Goals (Current goals can be found in the Care Plan section) Acute Rehab PT Goals Patient Stated Goal: to figure out what is causing her to be short of breath PT Goal Formulation: With patient Time For Goal Achievement:  02/01/16 Potential to Achieve Goals: Good    Frequency Min 2X/week   Barriers to discharge Decreased caregiver support Intermittent assist only    Co-evaluation               End of Session Equipment Utilized During Treatment: Gait belt;Other (comment) (Lt knee brace) Activity Tolerance: Patient limited by fatigue;Patient tolerated treatment well Patient left: in chair;with call bell/phone within reach;with chair alarm set Nurse Communication: Mobility status;Other (comment);Precautions (Lt knee brace w/ ambulation)    Functional Assessment Tool Used: Clinical Judgement Functional Limitation: Mobility: Walking and moving around Mobility: Walking and Moving Around Current Status (P5093): At least 20 percent but less than 40 percent impaired, limited or restricted Mobility: Walking and Moving Around Goal Status [phone]): At least 1 percent but less than 20 percent impaired, limited or restricted    Time: 4580-9983 PT Time Calculation (min) (ACUTE ONLY): 25 min   Charges:   PT Evaluation $Initial PT Evaluation Tier I: 1 Procedure PT Treatments $Gait Training: 8-22 mins   PT G Codes:   PT G-Codes **NOT FOR INPATIENT CLASS** Functional Assessment Tool Used: Clinical Judgement Functional Limitation: Mobility: Walking and moving around Mobility: Walking and Moving Around Current Status (J8250): At least 20 percent but less than 40 percent impaired, limited or restricted Mobility: Walking and Moving Around Goal Status [phone]): At least 1 percent but less than 20 percent impaired, limited  or restricted   Joslyn Hy PT, DPT [phone] Pager: [phone] 01/11/2015, 4:53 PM

## 2015-01-11 NOTE — Clinical Social Work Note (Signed)
Clinical Social Work Assessment  Patient Details  Name: Alexis Winters MRN: [id number] Date of Birth: 10-Jan-1928  Date of referral:  01/11/15               Reason for consult:  Facility Placement, Discharge Planning                Permission sought to share information with:  Family Supports, Oceanographer granted to share information::  Yes, Verbal Permission Granted  Name::     Secondary school teacher::  Memory Care  Relationship::     Contact Information:     Housing/Transportation Living arrangements for the past 2 months:  Single Family Home Source of Information:  Adult Children Patient Interpreter Needed:  None Criminal Activity/Legal Involvement Pertinent to Current Situation/Hospitalization:  No - Comment as needed Significant Relationships:  Adult Children Lives with:  Self Do you feel safe going back to the place where you live?  Yes Need for family participation in patient care:  Yes (Comment)  Care giving concerns:  Patient's daughter Alexis Winters states she has been attempting to get the patient into a memory care facility from home but has been unable to find a facility that will accept Medicaid. She states that [facility] has offered in the past but she is not happy with the facility.   Social Worker assessment / plan:  CSW was informed by RN this AM that the patient's daughter wanted to speak with CSW regarding memory care. CSW met with patient's daughter Alexis Winters to complete assessment. Alexis Winters states that she has been working on getting the patient into a memory care facility from home as the PCP has suggested this. Alexis Winters states she was told by the patient's PCP to bring her to the hospital and that the hospital would be able to place the patient. CSW stressed to the daughter that this is not necessarily true as CSW cannot guarantee placement will be found before the patient is discharged. Daughter asks for CSW's assistance in finding a facility that  will accept a Medicaid patient at this time. CSW has contact Alexis Winters with [facility]. [name] is able to accept Medicaid patients at this time. Alexis Winters and daughter Alexis Winters will discuss options. Per Sandee the patient receives $900/mo in SSI and ~$280/mo from a pension. CSW will assist with patient's discharge if a discharge to memory care from the hospital is feasible. Per the daughter the patient manages pretty well at home with her life alert and with the daughter living "down the street."  Employment status:  Retired Community education officer information:  Medicare PT Recommendations:  Not assessed at this time Information / Referral to community resources:  Other (Comment Required) (Daughter is requesting CSW check to see if any memory care facilities currently have bed availablility.)  Patient/Family's Response to care: Patient's family appears happy with the care the patient has received.  Patient/Family's Understanding of and Emotional Response to Diagnosis, Current Treatment, and Prognosis:  Patient's daughter appears to have a good understanding of the reason for the patient's admission and the patient's diagnosis. She understands the limitations CSWs have in getting patients directly into memory care facilities directly from the hospital.  Emotional Assessment Appearance:  Appears stated age Attitude/Demeanor/Rapport:  Other (Appropriate) Affect (typically observed):  Accepting, Calm Orientation:  Oriented to Self, Oriented to Place Alcohol / Substance use:  Never Used Psych involvement (Current and /or in the community):  No (Comment)  Discharge Needs  Concerns to be addressed:  Discharge Planning Concerns Readmission within the last 30 days:  No Current discharge risk:  Cognitively Impaired, Chronically ill, Lives alone Barriers to Discharge:  Continued Medical Work up   [employer] MSW, [place], [place], [phone]

## 2015-01-11 NOTE — Progress Notes (Signed)
Alexis Winters [id number] DOB: December 03, 1927 DOA: 01/10/2015 PCP: Thamas Jaegers, NP  Brief narrative:  81 y/o ? H/o diastolic chf per Echo 07/930 Prior Cardiac cath with apparent arrest 1980's ? Mild-mod dementia confounding hiistory that she presents with arrythmia's vs P. Afib Chad2Vasc2=4-5 not on AC--had event monitor on last admission 08/2013 stg 1 Br Ca infilt ductal type s/p lumpectomy + XRT 1991  Admitted to [facility] with vague chest pain, low blood pressure and shortness of breath occurring for the past 2-3 days without any exacerbating or relieving factors other than activity  VQ scan was non-suggestive of pulmonary embolus  Past medical history-As per Problem list Chart reviewed as below-   Consultants:  None  Procedures:  None  Antibiotics:  None   Subjective   Alert pleasant no further recurrence of pain in the chest shortness of breath Tolerating diet fairly well Note that blood pressures have been little low   Objective    Interim History:   Telemetry: Sinus rhythm/sinus bradycardia   Objective: Filed Vitals:   01/10/15 1430 01/10/15 1549 01/10/15 2100 01/11/15 0500  BP: 112/41 109/35 129/41 102/49  Pulse: 72 71 80 73  Temp:  98.3 F (36.8 C) 97.3 F (36.3 C) 98.3 F (36.8 C)  TempSrc:  Oral Oral Oral  Resp: 25 16    Height:  4\' 11"  (1.499 m)    Weight:  61.2 kg (134 lb 14.7 oz)  58.287 kg (128 lb 8 oz)  SpO2: 97% 99% 98% 97%    Intake/Output Summary (Last 24 hours) at 01/11/15 1040 Last data filed at 01/11/15 0800  Gross per 24 hour  Intake      0 ml  Output    500 ml  Net   -500 ml    Exam:  General: EOMI frail very hard of her Cardiovascular: S1-S2 no murmur rub or gallop Respiratory: No crackles no rales no icterus Abdomen: Soft nontender nondistended no rebound Skin intact Neuro grossly intact moving all 4 limbs equally hours as  Data Reviewed: Basic Metabolic Panel:  Recent Labs Lab  01/10/15 1122 01/10/15 1249 01/10/15 1314 01/11/15 0353  NA 138 138 141 142  K 6.5* 6.3* 3.8 3.9  CL 104 103 104 105  CO2 28  --   --  28  GLUCOSE 136* 124* 101* 103*  BUN 16 30* 22* 15  CREATININE 1.10* 1.20* 1.20* 1.16*  CALCIUM 8.9  --   --  9.0  MG  --   --   --  2.0   Liver Function Tests:  Recent Labs Lab 01/10/15 1122 01/11/15 0353  AST 71* 23  ALT 21 14  ALKPHOS 68 63  BILITOT 2.1* 1.2  PROT 6.2* 5.6*  ALBUMIN 3.4* 3.4*   No results for input(s): LIPASE, AMYLASE in the last 168 hours. No results for input(s): AMMONIA in the last 168 hours. CBC:  Recent Labs Lab 01/10/15 1122 01/10/15 1249 01/10/15 1314 01/11/15 0353  WBC 10.6*  --   --  5.7  NEUTROABS 9.5*  --   --   --   HGB 13.8 14.3 13.9 12.5  HCT 40.6 42.0 41.0 37.6  MCV 90.0  --   --  90.6  PLT 185  --   --  140*   Cardiac Enzymes:  Recent Labs Lab 01/10/15 1122  TROPONINI <0.03   BNP: Invalid input(s): POCBNP CBG: No results for input(s): GLUCAP in the last 168 hours.  No results found for this  or any previous visit (from the past 240 hour(s)).   Studies:              All Imaging reviewed and is as per above notation   Scheduled Meds: . aspirin EC  81 mg Oral Daily  . enoxaparin (LOVENOX) injection  30 mg Subcutaneous Q24H  . furosemide  40 mg Oral BID  . lisinopril  2.5 mg Oral Daily  . metoprolol succinate  50 mg Oral Daily  . pantoprazole  40 mg Oral Daily  . potassium chloride SA  20 mEq Oral BID  . sodium chloride  3 mL Intravenous Q12H   Continuous Infusions:    Assessment/Plan:  1. Near-syncope + Chest pain-unlikely to be cardiogenic given normal troponins. Obtain echocardiogram to rule out any further decrease in EF which may account for damage to her heart- last EF was 65-70%.  Troponins have been negative-VQ scan has been negative for pulmonary embolus. TSH 1.3 2. Possible tachybradycardia syndrome with A. fib [place] score 6-Will control with home medication of  metoprolol XL 50 daily. Monitor blood pressures-discontinued lisinopril 2.5. Continue metoprolol XL 50 daily. May consider cardiology input regarding ? Need for pacemaker as well as anticoagulation and for as it appears she was not on any previously-had a loop recorder in 2015 but we are not aware of results. 3. Prior history stage I breast cancer infiltrative status post lumpectomy 1991 + new Pulmonary nodule-has history of breast cancer. Needs outpatient evaluation and follow-up 4. False hyperkalemia-initial sample hemolyzed. Resolved. 5. Hypocalcemia-replaced IV. Recheck calcium in a.m. start replacement with calcium carbonate 500 3 times a day or 3 times a day 6. Hypertension/prior CAD with arrest-down titrated lisinopril today. Continue metoprolol XL 50 daily. 7. Reflux-continue Protonix 40 daily 8. Mild/moderate dementia-reorient daily  Code Status: Full Family Communication: Long discussion at the bedside with the daughter Disposition Plan: Inpatient pedning further work-up and eval Await Echo Monitor tele 24 hours   Verneita Griffes, MD  [facility] Pager [phone] 01/11/2015, 10:40 AM    LOS: 1 day

## 2015-01-11 NOTE — Progress Notes (Signed)
*  PRELIMINARY RESULTS* Vascular Ultrasound Lower extremity venous duplex has been completed.  Preliminary findings: No evidence of DVT or baker's cyst.   Landry Mellow, RDMS, RVT  01/11/2015, 3:20 PM

## 2015-01-11 NOTE — Progress Notes (Signed)
PT Cancellation Note  Patient Details Name: Alexis Winters MRN: [id number] DOB: 22-Aug-1927   Cancelled Treatment:    Reason Eval/Treat Not Completed: Medical issues which prohibited therapy. Pt scheduled for doppler to r/o DVT.  Will check back to complete PT evaluation. Santiago Glad L. Tamala Julian, [place] Pager [phone] 01/11/2015    Alexis Winters 01/11/2015, 8:48 AM

## 2015-01-12 ENCOUNTER — Ambulatory Visit (HOSPITAL_BASED_OUTPATIENT_CLINIC_OR_DEPARTMENT_OTHER): Payer: Medicare Other

## 2015-01-12 DIAGNOSIS — R0600 Dyspnea, unspecified: Secondary | ICD-10-CM

## 2015-01-12 DIAGNOSIS — R55 Syncope and collapse: Secondary | ICD-10-CM | POA: Diagnosis not present

## 2015-01-12 LAB — COMPREHENSIVE METABOLIC PANEL
ALT: 15 U/L (ref 14–54)
AST: 24 U/L (ref 15–41)
Albumin: 3.1 g/dL — ABNORMAL LOW (ref 3.5–5.0)
Alkaline Phosphatase: 64 U/L (ref 38–126)
Anion gap: 10 (ref 5–15)
BUN: 23 mg/dL — AB (ref 6–20)
CHLORIDE: 98 mmol/L — AB (ref 101–111)
CO2: 29 mmol/L (ref 22–32)
CREATININE: 1.15 mg/dL — AB (ref 0.44–1.00)
Calcium: 8.8 mg/dL — ABNORMAL LOW (ref 8.9–10.3)
GFR calc Af Amer: 49 mL/min — ABNORMAL LOW (ref >60)
GFR calc non Af Amer: 42 mL/min — ABNORMAL LOW (ref >60)
Glucose, Bld: 126 mg/dL — ABNORMAL HIGH (ref 65–99)
Potassium: 3.7 mmol/L (ref 3.5–5.1)
SODIUM: 137 mmol/L (ref 135–145)
Total Bilirubin: 0.9 mg/dL (ref 0.3–1.2)
Total Protein: 5.8 g/dL — ABNORMAL LOW (ref 6.5–8.1)

## 2015-01-12 LAB — CBC
HCT: 39.3 % (ref 36.0–46.0)
Hemoglobin: 13.3 g/dL (ref 12.0–15.0)
MCH: 30.3 pg (ref 26.0–34.0)
MCHC: 33.8 g/dL (ref 30.0–36.0)
MCV: 89.5 fL (ref 78.0–100.0)
Platelets: 138 10*3/uL — ABNORMAL LOW (ref 150–400)
RBC: 4.39 MIL/uL (ref 3.87–5.11)
RDW: 12.2 % (ref 11.5–15.5)
WBC: 5.2 10*3/uL (ref 4.0–10.5)

## 2015-01-12 LAB — CALCIUM, IONIZED: CALCIUM, IONIZED, SERUM: 5.1 mg/dL (ref 4.5–5.6)

## 2015-01-12 MED ORDER — CALCIUM CARBONATE ANTACID 500 MG PO CHEW: 1.0000 | CHEWABLE_TABLET | Freq: Three times a day (TID) | ORAL | Status: DC

## 2015-01-12 NOTE — Discharge Summary (Signed)
Physician Discharge Summary  Alexis Winters QAS:[id number] DOB: 1927-12-05 DOA: 01/10/2015  PCP: Thamas Jaegers, NP  Admit date: 01/10/2015 Discharge date: 01/12/2015  Time spent: 40 minutes  Recommendations for Outpatient Follow-up:  1. Patient had some mild hypocalcemia on this admission therefore started on twice a day calcium carbonate 2. I've discontinued patient's recently started lisinopril 2.5 given syncope and orthostasis  3. patient will need close follow-up with her regular heart Dr. Acie Fredrickson for consideration of management of fluid status and also hypertensive medication adjustment 4. home health PT OT and social worker has been consulted as patient has mild dementia and may benefit from memory care placement eventually 5. Suggest basic metabolic panel as well as CBC in about one week 6. Chest x-ray was suggestive of lung nodule now would suggest close follow-up with outpatient oncologist  Discharge Diagnoses:  Principal Problem:   Near syncope Active Problems:   Paroxysmal atrial fibrillation (HCC)   Chronic diastolic CHF (congestive heart failure) (HCC)   Chest pain   Neck pain on left side   Pulmonary nodule, right   Leukocytosis   Hypocalcemia   Dyspnea   Discharge Condition: Improved  Diet recommendation: *Healthy low-salt  Filed Weights   01/10/15 1549 01/11/15 0500 01/12/15 0500  Weight: 61.2 kg (134 lb 14.7 oz) 58.287 kg (128 lb 8 oz) 57.153 kg (126 lb)    History of present illness:  81 y/o ? H/o diastolic chf per Echo 09/9890 Prior Cardiac cath with apparent arrest 1980's ? Mild-mod dementia confounding hiistory that she presents with arrythmia's vs P. Afib Chad2Vasc2=4-5 not on AC--had event monitor on last admission 08/2013 stg 1 Br Ca infilt ductal type s/p lumpectomy + XRT 1991  Admitted to [facility] with vague chest pain, low blood pressure and shortness of breath occurring for the past 2-3 days without any exacerbating or relieving  factors other than activity  VQ scan was non-suggestive of pulmonary embolus  Admitted for further workup as below   Hospital Course:    1. Near-syncope + Chest pain-unlikely to be cardiogenic given normal troponins no persistent chest pain.  echocardiogram did not reveal decrease in EF and showed results consistent with prior- last EF was 65-70%. Troponins have been negative-VQ scan has been negative for pulmonary embolus. TSH 1.3 2. Orthostatic hypotension-likely secondary to multiple medications, age. Would monitor patient's volume status aggressively. She has lost some weight in the same dose of Lasix as an outpatient and I will defer this further adjustment to cardiology 3. Possible tachybradycardia syndrome with A. fib [place] score 6-Will control with home medication of metoprolol XL 50 daily. Monitor blood pressures-discontinued lisinopril 2.5. Continue metoprolol XL 50 daily. May consider cardiology input regarding ? Need for pacemaker as well as anticoagulation and for as it appears she was not on any previously-had a loop recorder in 2015 but we are not aware of results. 4. Prior history stage I breast cancer infiltrative status post lumpectomy 1991 + new Pulmonary nodule-has history of breast cancer. Needs outpatient evaluation and follow-up 5. False hyperkalemia-initial sample hemolyzed. Resolved. 6. Hypocalcemia-replaced IV initially. Recheck calcium in a.m. start replacement with calcium carbonate 500 3 times a day or 3 times a day 7. Hypertension/prior CAD with arrest-down titrated lisinopril today. Continue metoprolol XL 50 daily. 8. Reflux-continue Protonix 40 daily 9. Mild/moderate dementia-reorient daily    Discharge Exam: Filed Vitals:   01/12/15 0852  BP:   Pulse:   Temp: 98.1 F (36.7 C)  Resp:  Alert pleasant oriented 2 no apparent distress. Very hard time. Pleasant No chest pain No chills No fever No other issues  On exam No pallor no icterus mucosa  is moist S1-S2 no murmur rub or gallop Abdomen soft nontender nondistended  Discharge Instructions    Current Discharge Medication List    START taking these medications   Details  calcium carbonate (TUMS - DOSED IN MG ELEMENTAL CALCIUM) 500 MG chewable tablet Chew 1 tablet (200 mg of elemental calcium total) by mouth 3 (three) times daily. Qty: 60 tablet, Refills: 0      CONTINUE these medications which have NOT CHANGED   Details  aspirin EC 81 MG tablet Take 81 mg by mouth daily.    furosemide (LASIX) 40 MG tablet Take 40 mg by mouth 2 (two) times daily.     KLOR-CON M20 20 MEQ tablet Take 20 mEq by mouth 2 (two) times daily.     pantoprazole (PROTONIX) 40 MG tablet Take 40 mg by mouth daily. Refills: 3    TOPROL XL 50 MG 24 hr tablet Take 50 mg by mouth daily.       STOP taking these medications     lisinopril (PRINIVIL,ZESTRIL) 2.5 MG tablet        Allergies  Allergen Reactions  . Contrast Media [Iodinated Diagnostic Agents] Other (See Comments)    Cardiac arrest  . Pyridium [Phenazopyridine Hcl] Swelling    unknown   Follow-up Information    Follow up with [facility].   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    Contact information:   [address] [place] [address] [phone]        The results of significant diagnostics from this hospitalization (including imaging, microbiology, ancillary and laboratory) are listed below for reference.    Significant Diagnostic Studies: Dg Chest 2 View  01/10/2015  CLINICAL DATA:  Shortness of breath starting 1 week ago EXAM: CHEST  2 VIEW COMPARISON:  09/11/2013 FINDINGS: Cardiomediastinal silhouette is stable. No acute infiltrate or pleural effusion. No pulmonary edema. Accessory azygos fissure/ lobe again noted. Stable calcified granuloma in right upper lobe. There is 7 mm nodule in right lower lobe laterally. Further correlation with nonemergent CT scan of the chest  is recommended. Mild degenerative changes thoracolumbar spine. No pulmonary edema. IMPRESSION: No active disease. Stable calcified granuloma in right upper lobe. There is 7 mm nodule in right lower lobe laterally. Further correlation with nonemergent CT scan of the chest is recommended. Electronically Signed   By: Lahoma Crocker M.D.   On: 01/10/2015 12:23   Nm Pulmonary Perf And Vent  01/11/2015  CLINICAL DATA:  Syncopal episode.  No chest pain. EXAM: NUCLEAR MEDICINE VENTILATION - PERFUSION LUNG SCAN TECHNIQUE: Ventilation images were obtained in multiple projections using inhaled aerosol Tc-97m DTPA. Perfusion images were obtained in multiple projections after intravenous injection of Tc-24m MAA. RADIOPHARMACEUTICALS:  30.5 mCi Technetium-65m DTPA aerosol inhalation and 4.2 mCi Technetium-22m MAA IV COMPARISON:  None. FINDINGS: Ventilation: No focal ventilation defect. Perfusion: No wedge shaped peripheral perfusion defects to suggest acute pulmonary embolism. IMPRESSION: No scintigraphic evidence of pulmonary embolus. Electronically Signed   By: Kathreen Devoid   On: 01/11/2015 14:05    Microbiology: No results found for this or any previous visit (from the past 240 hour(s)).   Labs: Basic Metabolic Panel:  Recent Labs Lab 01/10/15 1122 01/10/15 1249 01/10/15 1314 01/11/15 0353 01/12/15 0339  NA 138 138  138 141 142 137  K 6.5* 6.3*  6.3* 3.8 3.9 3.7  CL 104 103  103 104 105 98*  CO2 28  --   --  28 29  GLUCOSE 136* 124*  124* 101* 103* 126*  BUN 16 30*  30* 22* 15 23*  CREATININE 1.10* 1.20*  1.20* 1.20* 1.16* 1.15*  CALCIUM 8.9  --   --  9.0 8.8*  MG  --   --   --  2.0  --    Liver Function Tests:  Recent Labs Lab 01/10/15 1122 01/11/15 0353 01/12/15 0339  AST 71* 23 24  ALT 21 14 15   ALKPHOS 68 63 64  BILITOT 2.1* 1.2 0.9  PROT 6.2* 5.6* 5.8*  ALBUMIN 3.4* 3.4* 3.1*   No results for input(s): LIPASE, AMYLASE in the last 168 hours. No results for input(s): AMMONIA in  the last 168 hours. CBC:  Recent Labs Lab 01/10/15 1122 01/10/15 1249 01/10/15 1314 01/11/15 0353 01/12/15 0339  WBC 10.6*  --   --  5.7 5.2  NEUTROABS 9.5*  --   --   --   --   HGB 13.8 14.3  14.3 13.9 12.5 13.3  HCT 40.6 42.0  42.0 41.0 37.6 39.3  MCV 90.0  --   --  90.6 89.5  PLT 185  --   --  140* 138*   Cardiac Enzymes:  Recent Labs Lab 01/10/15 1122  TROPONINI <0.03   BNP: BNP (last 3 results)  Recent Labs  01/10/15 1122  BNP 219.0*    ProBNP (last 3 results) No results for input(s): PROBNP in the last 8760 hours.  CBG: No results for input(s): GLUCAP in the last 168 hours.     SignedNita Sells  [facility] 01/12/2015, 1:26 PM

## 2015-01-12 NOTE — Progress Notes (Signed)
  Echocardiogram 2D Echocardiogram has been performed.  Alexis Winters 01/12/2015, 10:33 AM

## 2015-01-12 NOTE — Care Management Note (Signed)
Case Management Note  Patient Details  Name: ITZEL MCKIBBIN MRN: [id number] Date of Birth: March 25, 1928  Subjective/Objective:  Pt admitted for Near Syncopal Episode. Pt is from home and plan is to return home until pt is ready for Memory Care ALF. Pt has support of daughter.                   Action/Plan: CM did discuss home health and pt/ daughter agreeable to [facility] for PT/ OT/ SW. Orders in EPIC- Referral made to [facility] and [facility] [place] to begin within 24-48 hrs post d/c. No further needs from CM at this time.   Expected Discharge Date:                  Expected Discharge Plan:  [place]  In-House Referral:  Clinical Social Work  Discharge planning Services  CM Consult  Post Acute Care Choice:    Choice offered to:  Patient, Adult Children  DME Arranged:  N/A DME Agency:  [place] Arranged:  PT, OT, Social Work [employer] Agency:  [place]  Status of Service:  Completed, signed off  Medicare Important Message Given:    Date Medicare IM Given:    Medicare IM give by:    Date Additional Medicare IM Given:    Additional Medicare Important Message give by:     If discussed at [place] of Stay Meetings, dates discussed:    Additional Comments:  Bethena Roys, RN 01/12/2015, 11:03 AM

## 2015-01-12 NOTE — Progress Notes (Signed)
Discharge teaching and instructions reviewed with patient and patient's daughter. Pt understands teaching. VSS. Pt will be discharging home via daughter.

## 2015-01-13 DIAGNOSIS — F0390 Unspecified dementia without behavioral disturbance: Secondary | ICD-10-CM | POA: Diagnosis not present

## 2015-01-13 DIAGNOSIS — I5032 Chronic diastolic (congestive) heart failure: Secondary | ICD-10-CM | POA: Diagnosis not present

## 2015-01-13 DIAGNOSIS — Z87891 Personal history of nicotine dependence: Secondary | ICD-10-CM | POA: Diagnosis not present

## 2015-01-13 DIAGNOSIS — R55 Syncope and collapse: Secondary | ICD-10-CM | POA: Diagnosis not present

## 2015-01-13 DIAGNOSIS — E785 Hyperlipidemia, unspecified: Secondary | ICD-10-CM | POA: Diagnosis not present

## 2015-01-13 DIAGNOSIS — I10 Essential (primary) hypertension: Secondary | ICD-10-CM | POA: Diagnosis not present

## 2015-01-13 DIAGNOSIS — I4891 Unspecified atrial fibrillation: Secondary | ICD-10-CM | POA: Diagnosis not present

## 2015-01-13 DIAGNOSIS — H9193 Unspecified hearing loss, bilateral: Secondary | ICD-10-CM | POA: Diagnosis not present

## 2015-01-13 DIAGNOSIS — R7309 Other abnormal glucose: Secondary | ICD-10-CM | POA: Diagnosis not present

## 2015-01-13 DIAGNOSIS — Z7982 Long term (current) use of aspirin: Secondary | ICD-10-CM | POA: Diagnosis not present

## 2015-01-13 DIAGNOSIS — Z853 Personal history of malignant neoplasm of breast: Secondary | ICD-10-CM | POA: Diagnosis not present

## 2015-01-13 LAB — CALCIUM, IONIZED: Calcium, Ionized, Serum: 5 mg/dL (ref 4.5–5.6)

## 2015-01-14 DIAGNOSIS — F0390 Unspecified dementia without behavioral disturbance: Secondary | ICD-10-CM | POA: Diagnosis not present

## 2015-01-14 DIAGNOSIS — I10 Essential (primary) hypertension: Secondary | ICD-10-CM | POA: Diagnosis not present

## 2015-01-14 DIAGNOSIS — R55 Syncope and collapse: Secondary | ICD-10-CM | POA: Diagnosis not present

## 2015-01-14 DIAGNOSIS — I5032 Chronic diastolic (congestive) heart failure: Secondary | ICD-10-CM | POA: Diagnosis not present

## 2015-01-14 DIAGNOSIS — I4891 Unspecified atrial fibrillation: Secondary | ICD-10-CM | POA: Diagnosis not present

## 2015-01-14 DIAGNOSIS — E785 Hyperlipidemia, unspecified: Secondary | ICD-10-CM | POA: Diagnosis not present

## 2015-01-15 DIAGNOSIS — I10 Essential (primary) hypertension: Secondary | ICD-10-CM | POA: Diagnosis not present

## 2015-01-15 DIAGNOSIS — I4891 Unspecified atrial fibrillation: Secondary | ICD-10-CM | POA: Diagnosis not present

## 2015-01-15 DIAGNOSIS — R55 Syncope and collapse: Secondary | ICD-10-CM | POA: Diagnosis not present

## 2015-01-15 DIAGNOSIS — F0390 Unspecified dementia without behavioral disturbance: Secondary | ICD-10-CM | POA: Diagnosis not present

## 2015-01-15 DIAGNOSIS — I5032 Chronic diastolic (congestive) heart failure: Secondary | ICD-10-CM | POA: Diagnosis not present

## 2015-01-15 DIAGNOSIS — E785 Hyperlipidemia, unspecified: Secondary | ICD-10-CM | POA: Diagnosis not present

## 2015-01-19 DIAGNOSIS — R55 Syncope and collapse: Secondary | ICD-10-CM | POA: Diagnosis not present

## 2015-01-19 DIAGNOSIS — E785 Hyperlipidemia, unspecified: Secondary | ICD-10-CM | POA: Diagnosis not present

## 2015-01-19 DIAGNOSIS — F0390 Unspecified dementia without behavioral disturbance: Secondary | ICD-10-CM | POA: Diagnosis not present

## 2015-01-19 DIAGNOSIS — I5032 Chronic diastolic (congestive) heart failure: Secondary | ICD-10-CM | POA: Diagnosis not present

## 2015-01-19 DIAGNOSIS — I10 Essential (primary) hypertension: Secondary | ICD-10-CM | POA: Diagnosis not present

## 2015-01-19 DIAGNOSIS — I4891 Unspecified atrial fibrillation: Secondary | ICD-10-CM | POA: Diagnosis not present

## 2015-01-26 DIAGNOSIS — R55 Syncope and collapse: Secondary | ICD-10-CM | POA: Diagnosis not present

## 2015-01-26 DIAGNOSIS — I10 Essential (primary) hypertension: Secondary | ICD-10-CM | POA: Diagnosis not present

## 2015-01-26 DIAGNOSIS — I5032 Chronic diastolic (congestive) heart failure: Secondary | ICD-10-CM | POA: Diagnosis not present

## 2015-01-26 DIAGNOSIS — I4891 Unspecified atrial fibrillation: Secondary | ICD-10-CM | POA: Diagnosis not present

## 2015-01-26 DIAGNOSIS — F0390 Unspecified dementia without behavioral disturbance: Secondary | ICD-10-CM | POA: Diagnosis not present

## 2015-01-26 DIAGNOSIS — E785 Hyperlipidemia, unspecified: Secondary | ICD-10-CM | POA: Diagnosis not present

## 2015-02-02 DIAGNOSIS — I5032 Chronic diastolic (congestive) heart failure: Secondary | ICD-10-CM | POA: Diagnosis not present

## 2015-02-02 DIAGNOSIS — E785 Hyperlipidemia, unspecified: Secondary | ICD-10-CM | POA: Diagnosis not present

## 2015-02-02 DIAGNOSIS — I4891 Unspecified atrial fibrillation: Secondary | ICD-10-CM | POA: Diagnosis not present

## 2015-02-02 DIAGNOSIS — R55 Syncope and collapse: Secondary | ICD-10-CM | POA: Diagnosis not present

## 2015-02-02 DIAGNOSIS — I10 Essential (primary) hypertension: Secondary | ICD-10-CM | POA: Diagnosis not present

## 2015-02-02 DIAGNOSIS — F0390 Unspecified dementia without behavioral disturbance: Secondary | ICD-10-CM | POA: Diagnosis not present

## 2015-02-16 DIAGNOSIS — Z110 Encounter for screening for intestinal infectious diseases: Secondary | ICD-10-CM | POA: Diagnosis not present

## 2015-02-24 ENCOUNTER — Ambulatory Visit (INDEPENDENT_AMBULATORY_CARE_PROVIDER_SITE_OTHER): Payer: Medicare Other | Admitting: Family Medicine

## 2015-02-24 VITALS — BP 114/64 | HR 64 | Temp 97.8°F | Resp 16 | Ht 59.0 in

## 2015-02-24 DIAGNOSIS — Z111 Encounter for screening for respiratory tuberculosis: Secondary | ICD-10-CM

## 2015-02-24 DIAGNOSIS — F0390 Unspecified dementia without behavioral disturbance: Secondary | ICD-10-CM | POA: Diagnosis not present

## 2015-02-24 NOTE — Progress Notes (Signed)
This 80 y.o. female presents for evaluation for Tb skin testing prior to placement in [place].   Moving to [facility] which is a memory care facility in 48 hours; moving in Friday.  Must have Tb skin test prior to moving in in 48 hours.  Had a Tb skin test seven days ago; did not have it read.  Presenting for repeat Tb skin test.    Dr. Deforest Hoyles but sees Toyah PA with [facility].  S/p flu vaccine and Pneumovax.   Tuberculosis Risk Questionnaire  1. No Were you born outside the [place] in one of the following parts of the world: [place], [place], [place], [place] or [place]?    2. No Have you traveled outside the [place] and lived for more than one month in one of the following parts of the world: [place], [place], [place], [place] or [place]?    3. No Do you have a compromised immune system such as from any of the following conditions:HIV/AIDS, organ or bone marrow transplantation, diabetes, immunosuppressive medicines (e.g. Prednisone, Remicaide), leukemia, lymphoma, cancer of the head or neck, gastrectomy or jejunal bypass, end-stage renal disease (on dialysis), or silicosis?     4. Yes  Have you ever or do you plan on working in: a residential care center, a health care facility, a jail or prison or homeless shelter?    5. No Have you ever: injected illegal drugs, used crack cocaine, lived in a homeless shelter  or been in jail or prison?     6. No Have you ever been exposed to anyone with infectious tuberculosis?    Tuberculosis Symptom Questionnaire  Do you currently have any of the following symptoms?  1. No Unexplained cough lasting more than 3 weeks?   2. No Unexplained fever lasting more than 3 weeks.   3. No Night Sweats (sweating that leaves the bedclothes and sheets wet)     4. No Shortness of Breath   5. No Chest Pain   6. No Unintentional weight loss    7. No Unexplained fatigue (very tired for no reason)    Past Medical History  Diagnosis Date  . Chronic diastolic CHF (congestive heart failure) ([place])     a. echo 09/12/13 EF Q000111Q, grade 1 diastolic dysf, mild LVOT obstr, moderate MR, elevated filling pressure  . Hypoglycemia   . Hard of hearing   . Phlebitis     l leg  . Hypertension   . Frequent UTI   . Enlarged liver   . Hemorrhoids   . Dyslipidemia   . OA (osteoarthritis)     hip and back  . Atrial fibrillation ([place])     a. h/o PAF, in NSR during admission 09/11/13  . Allergic rhinitis   . GERD (gastroesophageal reflux disease)   . Prediabetes   . Arrhythmia     a. office to call pt on 09/15/13 for 30 day event monitor  . Breast cancer ([place])   . Mitral regurgitation     a. moderate MR by echo 09/12/2013   Allergies  Allergen Reactions  . Contrast Media [Iodinated Diagnostic Agents] Other (See Comments)    Cardiac arrest  . Pyridium [Phenazopyridine Hcl] Swelling    unknown   Current Outpatient Prescriptions on File Prior to Visit  Medication Sig Dispense Refill  . aspirin EC 81 MG tablet Take 81 mg by mouth daily.    . calcium carbonate (TUMS - DOSED IN MG ELEMENTAL  CALCIUM) 500 MG chewable tablet Chew 1 tablet (200 mg of elemental calcium total) by mouth 3 (three) times daily. 60 tablet 0  . furosemide (LASIX) 40 MG tablet Take 40 mg by mouth 2 (two) times daily.     Marland Kitchen KLOR-CON M20 20 MEQ tablet Take 20 mEq by mouth 2 (two) times daily.     . pantoprazole (PROTONIX) 40 MG tablet Take 40 mg by mouth daily.  3  . TOPROL XL 50 MG 24 hr tablet Take 50 mg by mouth daily.      No current facility-administered medications on file prior to visit.    OBJECTIVE: Blood pressure 114/64, pulse 64, temperature 97.8 F (36.6 C), temperature source Oral, resp. rate 16, height 4\' 11"  (1.499 m), SpO2 98 %. There is no weight on file to calculate BMI. Well-developed, well nourished Caucasian female who is awake, alert in NAD HEENT: [place]/AT, PERRL, EOMI.  Sclera and conjunctiva are  clear.  Neck: supple, non-tender, no lymphadenopathy, thyromegaly. Heart: RRR, no murmur Lungs: normal effort, CTA Extremities: no cyanosis, clubbing or edema. Skin: warm and dry without rash. Psychologic: good mood and appropriate affect, normal speech and behavior.   ASSESSMENT AND PLAN 1. Dementia, without behavioral disturbance   2. Screening for tuberculosis     -Tb skin test placement. -RTC 48-72 hours for read.   Orders Placed This Encounter  Procedures  . TB Skin Test   No orders of the defined types were placed in this encounter.    Tamala Manzer Elayne Guerin, M.D. [facility] [address] [place], [place]  [address] [phone] phone [phone] fax

## 2015-03-02 DIAGNOSIS — F0390 Unspecified dementia without behavioral disturbance: Secondary | ICD-10-CM | POA: Diagnosis not present

## 2015-03-02 DIAGNOSIS — K219 Gastro-esophageal reflux disease without esophagitis: Secondary | ICD-10-CM | POA: Diagnosis not present

## 2015-03-02 DIAGNOSIS — M6281 Muscle weakness (generalized): Secondary | ICD-10-CM | POA: Diagnosis not present

## 2015-03-02 DIAGNOSIS — M150 Primary generalized (osteo)arthritis: Secondary | ICD-10-CM | POA: Diagnosis not present

## 2015-03-02 DIAGNOSIS — I5032 Chronic diastolic (congestive) heart failure: Secondary | ICD-10-CM | POA: Diagnosis not present

## 2015-03-14 DIAGNOSIS — I5032 Chronic diastolic (congestive) heart failure: Secondary | ICD-10-CM | POA: Diagnosis not present

## 2015-03-14 DIAGNOSIS — F0390 Unspecified dementia without behavioral disturbance: Secondary | ICD-10-CM | POA: Diagnosis not present

## 2015-03-14 DIAGNOSIS — I951 Orthostatic hypotension: Secondary | ICD-10-CM | POA: Diagnosis not present

## 2015-03-14 DIAGNOSIS — M6281 Muscle weakness (generalized): Secondary | ICD-10-CM | POA: Diagnosis not present

## 2015-03-14 DIAGNOSIS — F419 Anxiety disorder, unspecified: Secondary | ICD-10-CM | POA: Diagnosis not present

## 2015-03-14 DIAGNOSIS — M1990 Unspecified osteoarthritis, unspecified site: Secondary | ICD-10-CM | POA: Diagnosis not present

## 2015-03-15 DIAGNOSIS — I5032 Chronic diastolic (congestive) heart failure: Secondary | ICD-10-CM | POA: Diagnosis not present

## 2015-03-15 DIAGNOSIS — M1990 Unspecified osteoarthritis, unspecified site: Secondary | ICD-10-CM | POA: Diagnosis not present

## 2015-03-15 DIAGNOSIS — F0390 Unspecified dementia without behavioral disturbance: Secondary | ICD-10-CM | POA: Diagnosis not present

## 2015-03-15 DIAGNOSIS — I951 Orthostatic hypotension: Secondary | ICD-10-CM | POA: Diagnosis not present

## 2015-03-15 DIAGNOSIS — M6281 Muscle weakness (generalized): Secondary | ICD-10-CM | POA: Diagnosis not present

## 2015-03-15 DIAGNOSIS — F419 Anxiety disorder, unspecified: Secondary | ICD-10-CM | POA: Diagnosis not present

## 2015-03-18 DIAGNOSIS — F419 Anxiety disorder, unspecified: Secondary | ICD-10-CM | POA: Diagnosis not present

## 2015-03-18 DIAGNOSIS — M6281 Muscle weakness (generalized): Secondary | ICD-10-CM | POA: Diagnosis not present

## 2015-03-18 DIAGNOSIS — I951 Orthostatic hypotension: Secondary | ICD-10-CM | POA: Diagnosis not present

## 2015-03-18 DIAGNOSIS — M1990 Unspecified osteoarthritis, unspecified site: Secondary | ICD-10-CM | POA: Diagnosis not present

## 2015-03-18 DIAGNOSIS — F0390 Unspecified dementia without behavioral disturbance: Secondary | ICD-10-CM | POA: Diagnosis not present

## 2015-03-18 DIAGNOSIS — I5032 Chronic diastolic (congestive) heart failure: Secondary | ICD-10-CM | POA: Diagnosis not present

## 2015-03-19 DIAGNOSIS — F419 Anxiety disorder, unspecified: Secondary | ICD-10-CM | POA: Diagnosis not present

## 2015-03-19 DIAGNOSIS — I951 Orthostatic hypotension: Secondary | ICD-10-CM | POA: Diagnosis not present

## 2015-03-19 DIAGNOSIS — M6281 Muscle weakness (generalized): Secondary | ICD-10-CM | POA: Diagnosis not present

## 2015-03-19 DIAGNOSIS — I5032 Chronic diastolic (congestive) heart failure: Secondary | ICD-10-CM | POA: Diagnosis not present

## 2015-03-19 DIAGNOSIS — F0390 Unspecified dementia without behavioral disturbance: Secondary | ICD-10-CM | POA: Diagnosis not present

## 2015-03-19 DIAGNOSIS — M1990 Unspecified osteoarthritis, unspecified site: Secondary | ICD-10-CM | POA: Diagnosis not present

## 2015-03-23 DIAGNOSIS — F0390 Unspecified dementia without behavioral disturbance: Secondary | ICD-10-CM | POA: Diagnosis not present

## 2015-03-23 DIAGNOSIS — F419 Anxiety disorder, unspecified: Secondary | ICD-10-CM | POA: Diagnosis not present

## 2015-03-23 DIAGNOSIS — I951 Orthostatic hypotension: Secondary | ICD-10-CM | POA: Diagnosis not present

## 2015-03-23 DIAGNOSIS — M1990 Unspecified osteoarthritis, unspecified site: Secondary | ICD-10-CM | POA: Diagnosis not present

## 2015-03-23 DIAGNOSIS — M6281 Muscle weakness (generalized): Secondary | ICD-10-CM | POA: Diagnosis not present

## 2015-03-23 DIAGNOSIS — I5032 Chronic diastolic (congestive) heart failure: Secondary | ICD-10-CM | POA: Diagnosis not present

## 2015-03-23 DIAGNOSIS — Z79899 Other long term (current) drug therapy: Secondary | ICD-10-CM | POA: Diagnosis not present

## 2015-03-25 DIAGNOSIS — M6281 Muscle weakness (generalized): Secondary | ICD-10-CM | POA: Diagnosis not present

## 2015-03-25 DIAGNOSIS — I5032 Chronic diastolic (congestive) heart failure: Secondary | ICD-10-CM | POA: Diagnosis not present

## 2015-03-25 DIAGNOSIS — F0390 Unspecified dementia without behavioral disturbance: Secondary | ICD-10-CM | POA: Diagnosis not present

## 2015-03-25 DIAGNOSIS — I951 Orthostatic hypotension: Secondary | ICD-10-CM | POA: Diagnosis not present

## 2015-03-25 DIAGNOSIS — F419 Anxiety disorder, unspecified: Secondary | ICD-10-CM | POA: Diagnosis not present

## 2015-03-25 DIAGNOSIS — M1990 Unspecified osteoarthritis, unspecified site: Secondary | ICD-10-CM | POA: Diagnosis not present

## 2015-03-30 DIAGNOSIS — M1990 Unspecified osteoarthritis, unspecified site: Secondary | ICD-10-CM | POA: Diagnosis not present

## 2015-03-30 DIAGNOSIS — I951 Orthostatic hypotension: Secondary | ICD-10-CM | POA: Diagnosis not present

## 2015-03-30 DIAGNOSIS — F0390 Unspecified dementia without behavioral disturbance: Secondary | ICD-10-CM | POA: Diagnosis not present

## 2015-03-30 DIAGNOSIS — F419 Anxiety disorder, unspecified: Secondary | ICD-10-CM | POA: Diagnosis not present

## 2015-03-30 DIAGNOSIS — M6281 Muscle weakness (generalized): Secondary | ICD-10-CM | POA: Diagnosis not present

## 2015-03-30 DIAGNOSIS — I5032 Chronic diastolic (congestive) heart failure: Secondary | ICD-10-CM | POA: Diagnosis not present

## 2015-04-02 DIAGNOSIS — I5032 Chronic diastolic (congestive) heart failure: Secondary | ICD-10-CM | POA: Diagnosis not present

## 2015-04-02 DIAGNOSIS — M1990 Unspecified osteoarthritis, unspecified site: Secondary | ICD-10-CM | POA: Diagnosis not present

## 2015-04-02 DIAGNOSIS — M6281 Muscle weakness (generalized): Secondary | ICD-10-CM | POA: Diagnosis not present

## 2015-04-02 DIAGNOSIS — F419 Anxiety disorder, unspecified: Secondary | ICD-10-CM | POA: Diagnosis not present

## 2015-04-02 DIAGNOSIS — I951 Orthostatic hypotension: Secondary | ICD-10-CM | POA: Diagnosis not present

## 2015-04-02 DIAGNOSIS — F0390 Unspecified dementia without behavioral disturbance: Secondary | ICD-10-CM | POA: Diagnosis not present

## 2015-04-06 DIAGNOSIS — I5040 Unspecified combined systolic (congestive) and diastolic (congestive) heart failure: Secondary | ICD-10-CM | POA: Diagnosis not present

## 2015-04-07 DIAGNOSIS — L03116 Cellulitis of left lower limb: Secondary | ICD-10-CM | POA: Diagnosis not present

## 2015-04-07 DIAGNOSIS — I5030 Unspecified diastolic (congestive) heart failure: Secondary | ICD-10-CM | POA: Diagnosis not present

## 2015-04-07 DIAGNOSIS — M6281 Muscle weakness (generalized): Secondary | ICD-10-CM | POA: Diagnosis not present

## 2015-04-14 DIAGNOSIS — M6281 Muscle weakness (generalized): Secondary | ICD-10-CM | POA: Diagnosis not present

## 2015-04-14 DIAGNOSIS — F0390 Unspecified dementia without behavioral disturbance: Secondary | ICD-10-CM | POA: Diagnosis not present

## 2015-04-14 DIAGNOSIS — M1990 Unspecified osteoarthritis, unspecified site: Secondary | ICD-10-CM | POA: Diagnosis not present

## 2015-04-14 DIAGNOSIS — I951 Orthostatic hypotension: Secondary | ICD-10-CM | POA: Diagnosis not present

## 2015-04-14 DIAGNOSIS — I5032 Chronic diastolic (congestive) heart failure: Secondary | ICD-10-CM | POA: Diagnosis not present

## 2015-04-14 DIAGNOSIS — K219 Gastro-esophageal reflux disease without esophagitis: Secondary | ICD-10-CM | POA: Diagnosis not present

## 2015-04-19 DIAGNOSIS — R69 Illness, unspecified: Secondary | ICD-10-CM | POA: Diagnosis not present

## 2015-04-20 DIAGNOSIS — M6281 Muscle weakness (generalized): Secondary | ICD-10-CM | POA: Diagnosis not present

## 2015-04-20 DIAGNOSIS — I951 Orthostatic hypotension: Secondary | ICD-10-CM | POA: Diagnosis not present

## 2015-04-20 DIAGNOSIS — M1990 Unspecified osteoarthritis, unspecified site: Secondary | ICD-10-CM | POA: Diagnosis not present

## 2015-04-20 DIAGNOSIS — I5032 Chronic diastolic (congestive) heart failure: Secondary | ICD-10-CM | POA: Diagnosis not present

## 2015-04-20 DIAGNOSIS — K219 Gastro-esophageal reflux disease without esophagitis: Secondary | ICD-10-CM | POA: Diagnosis not present

## 2015-04-20 DIAGNOSIS — F0390 Unspecified dementia without behavioral disturbance: Secondary | ICD-10-CM | POA: Diagnosis not present

## 2015-04-23 DIAGNOSIS — I5032 Chronic diastolic (congestive) heart failure: Secondary | ICD-10-CM | POA: Diagnosis not present

## 2015-04-23 DIAGNOSIS — I951 Orthostatic hypotension: Secondary | ICD-10-CM | POA: Diagnosis not present

## 2015-04-23 DIAGNOSIS — K219 Gastro-esophageal reflux disease without esophagitis: Secondary | ICD-10-CM | POA: Diagnosis not present

## 2015-04-23 DIAGNOSIS — M1990 Unspecified osteoarthritis, unspecified site: Secondary | ICD-10-CM | POA: Diagnosis not present

## 2015-04-23 DIAGNOSIS — F0390 Unspecified dementia without behavioral disturbance: Secondary | ICD-10-CM | POA: Diagnosis not present

## 2015-04-23 DIAGNOSIS — M6281 Muscle weakness (generalized): Secondary | ICD-10-CM | POA: Diagnosis not present

## 2015-04-27 DIAGNOSIS — I5032 Chronic diastolic (congestive) heart failure: Secondary | ICD-10-CM | POA: Diagnosis not present

## 2015-04-27 DIAGNOSIS — M1990 Unspecified osteoarthritis, unspecified site: Secondary | ICD-10-CM | POA: Diagnosis not present

## 2015-04-27 DIAGNOSIS — I951 Orthostatic hypotension: Secondary | ICD-10-CM | POA: Diagnosis not present

## 2015-04-27 DIAGNOSIS — K219 Gastro-esophageal reflux disease without esophagitis: Secondary | ICD-10-CM | POA: Diagnosis not present

## 2015-04-27 DIAGNOSIS — M6281 Muscle weakness (generalized): Secondary | ICD-10-CM | POA: Diagnosis not present

## 2015-04-27 DIAGNOSIS — F0390 Unspecified dementia without behavioral disturbance: Secondary | ICD-10-CM | POA: Diagnosis not present

## 2015-04-29 DIAGNOSIS — F0280 Dementia in other diseases classified elsewhere without behavioral disturbance: Secondary | ICD-10-CM | POA: Diagnosis not present

## 2015-04-29 DIAGNOSIS — R600 Localized edema: Secondary | ICD-10-CM | POA: Diagnosis not present

## 2015-04-29 DIAGNOSIS — M170 Bilateral primary osteoarthritis of knee: Secondary | ICD-10-CM | POA: Diagnosis not present

## 2015-04-29 DIAGNOSIS — G301 Alzheimer's disease with late onset: Secondary | ICD-10-CM | POA: Diagnosis not present

## 2015-04-29 DIAGNOSIS — I5022 Chronic systolic (congestive) heart failure: Secondary | ICD-10-CM | POA: Diagnosis not present

## 2015-04-30 DIAGNOSIS — I5032 Chronic diastolic (congestive) heart failure: Secondary | ICD-10-CM | POA: Diagnosis not present

## 2015-04-30 DIAGNOSIS — M6281 Muscle weakness (generalized): Secondary | ICD-10-CM | POA: Diagnosis not present

## 2015-04-30 DIAGNOSIS — M1990 Unspecified osteoarthritis, unspecified site: Secondary | ICD-10-CM | POA: Diagnosis not present

## 2015-04-30 DIAGNOSIS — F0390 Unspecified dementia without behavioral disturbance: Secondary | ICD-10-CM | POA: Diagnosis not present

## 2015-04-30 DIAGNOSIS — I951 Orthostatic hypotension: Secondary | ICD-10-CM | POA: Diagnosis not present

## 2015-04-30 DIAGNOSIS — K219 Gastro-esophageal reflux disease without esophagitis: Secondary | ICD-10-CM | POA: Diagnosis not present

## 2015-05-02 ENCOUNTER — Encounter (HOSPITAL_COMMUNITY): Payer: Self-pay

## 2015-05-02 ENCOUNTER — Emergency Department (HOSPITAL_COMMUNITY): Payer: Medicare Other

## 2015-05-02 ENCOUNTER — Emergency Department (HOSPITAL_COMMUNITY)
Admission: EM | Admit: 2015-05-02 | Discharge: 2015-05-02 | Disposition: A | Discharge status: Home / Self Care | Payer: Medicare Other | Attending: Emergency Medicine | Admitting: Emergency Medicine

## 2015-05-02 DIAGNOSIS — H9190 Unspecified hearing loss, unspecified ear: Secondary | ICD-10-CM | POA: Insufficient documentation

## 2015-05-02 DIAGNOSIS — Z7982 Long term (current) use of aspirin: Secondary | ICD-10-CM | POA: Diagnosis not present

## 2015-05-02 DIAGNOSIS — Z87891 Personal history of nicotine dependence: Secondary | ICD-10-CM | POA: Diagnosis not present

## 2015-05-02 DIAGNOSIS — I5032 Chronic diastolic (congestive) heart failure: Secondary | ICD-10-CM | POA: Insufficient documentation

## 2015-05-02 DIAGNOSIS — R0789 Other chest pain: Secondary | ICD-10-CM | POA: Diagnosis not present

## 2015-05-02 DIAGNOSIS — Z9889 Other specified postprocedural states: Secondary | ICD-10-CM | POA: Insufficient documentation

## 2015-05-02 DIAGNOSIS — K219 Gastro-esophageal reflux disease without esophagitis: Secondary | ICD-10-CM | POA: Diagnosis not present

## 2015-05-02 DIAGNOSIS — Z87440 Personal history of urinary (tract) infections: Secondary | ICD-10-CM | POA: Diagnosis not present

## 2015-05-02 DIAGNOSIS — M1990 Unspecified osteoarthritis, unspecified site: Secondary | ICD-10-CM | POA: Insufficient documentation

## 2015-05-02 DIAGNOSIS — Z8709 Personal history of other diseases of the respiratory system: Secondary | ICD-10-CM | POA: Diagnosis not present

## 2015-05-02 DIAGNOSIS — Z792 Long term (current) use of antibiotics: Secondary | ICD-10-CM | POA: Diagnosis not present

## 2015-05-02 DIAGNOSIS — Z853 Personal history of malignant neoplasm of breast: Secondary | ICD-10-CM | POA: Diagnosis not present

## 2015-05-02 DIAGNOSIS — R079 Chest pain, unspecified: Secondary | ICD-10-CM | POA: Insufficient documentation

## 2015-05-02 DIAGNOSIS — Z8639 Personal history of other endocrine, nutritional and metabolic disease: Secondary | ICD-10-CM | POA: Diagnosis not present

## 2015-05-02 LAB — COMPREHENSIVE METABOLIC PANEL
ALK PHOS: 89 U/L (ref 38–126)
ALT: 20 U/L (ref 14–54)
AST: 30 U/L (ref 15–41)
Albumin: 3.3 g/dL — ABNORMAL LOW (ref 3.5–5.0)
Anion gap: 9 (ref 5–15)
BUN: 18 mg/dL (ref 6–20)
CALCIUM: 9.3 mg/dL (ref 8.9–10.3)
CO2: 30 mmol/L (ref 22–32)
CREATININE: 0.99 mg/dL (ref 0.44–1.00)
Chloride: 104 mmol/L (ref 101–111)
GFR, EST AFRICAN AMERICAN: 58 mL/min — AB (ref >60)
GFR, EST NON AFRICAN AMERICAN: 50 mL/min — AB (ref >60)
Glucose, Bld: 111 mg/dL — ABNORMAL HIGH (ref 65–99)
Potassium: 4.2 mmol/L (ref 3.5–5.1)
Sodium: 143 mmol/L (ref 135–145)
Total Bilirubin: 0.9 mg/dL (ref 0.3–1.2)
Total Protein: 6.1 g/dL — ABNORMAL LOW (ref 6.5–8.1)

## 2015-05-02 LAB — URINALYSIS, ROUTINE W REFLEX MICROSCOPIC
Bilirubin Urine: NEGATIVE
GLUCOSE, UA: NEGATIVE mg/dL
HGB URINE DIPSTICK: NEGATIVE
Ketones, ur: NEGATIVE mg/dL
Leukocytes, UA: NEGATIVE
Nitrite: NEGATIVE
PROTEIN: NEGATIVE mg/dL
SPECIFIC GRAVITY, URINE: 1.004 — AB (ref 1.005–1.030)
pH: 6.5 (ref 5.0–8.0)

## 2015-05-02 LAB — CBC
HEMATOCRIT: 41.3 % (ref 36.0–46.0)
HEMOGLOBIN: 13.8 g/dL (ref 12.0–15.0)
MCH: 29.5 pg (ref 26.0–34.0)
MCHC: 33.4 g/dL (ref 30.0–36.0)
MCV: 88.2 fL (ref 78.0–100.0)
Platelets: 135 10*3/uL — ABNORMAL LOW (ref 150–400)
RBC: 4.68 MIL/uL (ref 3.87–5.11)
RDW: 12.9 % (ref 11.5–15.5)
WBC: 5 10*3/uL (ref 4.0–10.5)

## 2015-05-02 LAB — TROPONIN I

## 2015-05-02 MED ORDER — ASPIRIN 81 MG PO CHEW: 324.0000 mg | CHEWABLE_TABLET | Freq: Once | ORAL | Status: DC

## 2015-05-02 MED ORDER — NITROGLYCERIN 0.4 MG SL SUBL: 0.4000 mg | SUBLINGUAL_TABLET | SUBLINGUAL | Status: DC | PRN

## 2015-05-02 NOTE — ED Notes (Signed)
Pt. Coming from [place] home via [facility] c/o central chest pressure this morning lasting around 10 min. Pt. Pain-free upon arrival. Pt. Hx of CHF, HTN, and early stages of dementia. Pt. Left-side restriction because of lymph node removal. Pt. Given 324 ASA en route, but no nitro due to being pain free. Pt. NSR and EKG unremarkable. Pt. Is hard of hearing. Pt. Aox4.

## 2015-05-02 NOTE — ED Provider Notes (Signed)
CSN: [id number]     Arrival date & time 05/02/15  1018 History   First MD Initiated Contact with Patient 05/02/15 1023     Chief Complaint  Patient presents with  . Chest Pain     (Consider location/radiation/quality/duration/timing/severity/associated sxs/prior Treatment) Patient is a 82 y.o. female presenting with chest pain.  Chest Pain Pain location:  L chest Pain quality: aching and sharp   Pain radiates to:  Does not radiate Pain radiates to the back: no   Pain severity:  Unable to specify Onset quality:  Unable to specify Timing:  Unable to specify Progression:  Resolved Chronicity:  Recurrent Relieved by:  None tried Worsened by:  Nothing tried Ineffective treatments:  None tried Associated symptoms: no abdominal pain, no cough, no fatigue, no fever, no nausea, no shortness of breath and not vomiting     Past Medical History  Diagnosis Date  . Chronic diastolic CHF (congestive heart failure) ([place])     a. echo 09/12/13 EF Q000111Q, grade 1 diastolic dysf, mild LVOT obstr, moderate MR, elevated filling pressure  . Hypoglycemia   . Hard of hearing   . Phlebitis     l leg  . Hypertension   . Frequent UTI   . Enlarged liver   . Hemorrhoids   . Dyslipidemia   . OA (osteoarthritis)     hip and back  . Atrial fibrillation ([place])     a. h/o PAF, in NSR during admission 09/11/13  . Allergic rhinitis   . GERD (gastroesophageal reflux disease)   . Prediabetes   . Arrhythmia     a. office to call pt on 09/15/13 for 30 day event monitor  . Breast cancer ([place])   . Mitral regurgitation     a. moderate MR by echo 09/12/2013   Past Surgical History  Procedure Laterality Date  . Abdominal hysterectomy    . Bladder suspension    . Breast lumpectomy    . Foot surgery      left foot  . Inner ear surgery    . Cardiac catheterization  32's    [facility]   Family History  Problem Relation Age of Onset  . Cerebral palsy Sister   . Alzheimer's disease Brother   . Hypertension  Father   . Heart disease Brother    Social History  Substance Use Topics  . Smoking status: Former Research scientist (life sciences)  . Smokeless tobacco: Never Used  . Alcohol Use: No   OB History    No data available     Review of Systems  Constitutional: Negative for fever, chills and fatigue.  Eyes: Negative for pain.  Respiratory: Negative for cough and shortness of breath.   Cardiovascular: Positive for chest pain.  Gastrointestinal: Negative for nausea, vomiting and abdominal pain.  All other systems reviewed and are negative.     Allergies  Contrast media and Pyridium  Home Medications   Prior to Admission medications   Medication Sig Start Date End Date Taking? Authorizing Provider  acetaminophen (TYLENOL) 500 MG tablet Take 500 mg by mouth every 4 (four) hours as needed for mild pain.   Yes Historical Provider, MD  alum & mag hydroxide-simeth (MINTOX) 200-200-20 MG/5ML suspension Take 30 mLs by mouth every 6 (six) hours as needed for indigestion or heartburn.   Yes Historical Provider, MD  aspirin EC 81 MG tablet Take 81 mg by mouth daily.   Yes Historical Provider, MD  furosemide (LASIX) 40 MG tablet Take 40 mg by mouth  2 (two) times daily.  04/09/12  Yes Historical Provider, MD  guaifenesin (ROBITUSSIN) 100 MG/5ML syrup Take 200 mg by mouth every 6 (six) hours as needed for cough.   Yes Historical Provider, MD  ibuprofen (ADVIL,MOTRIN) 200 MG tablet Take 200 mg by mouth every 6 (six) hours as needed (back pain).   Yes Historical Provider, MD  KLOR-CON M20 20 MEQ tablet Take 20 mEq by mouth 2 (two) times daily.  03/23/12  Yes Historical Provider, MD  loperamide (IMODIUM) 2 MG capsule Take 2 mg by mouth as needed for diarrhea or loose stools.   Yes Historical Provider, MD  magnesium hydroxide (MILK OF MAGNESIA) 400 MG/5ML suspension Take 30 mLs by mouth at bedtime as needed for mild constipation.   Yes Historical Provider, MD  Neomycin-Bacitracin-Polymyxin (HCA TRIPLE ANTIBIOTIC OINTMENT EX)  Apply 1 application topically daily as needed (skintear/abrasions).   Yes Historical Provider, MD  pantoprazole (PROTONIX) 40 MG tablet Take 40 mg by mouth daily. 10/19/14  Yes Historical Provider, MD  TOPROL XL 50 MG 24 hr tablet Take 50 mg by mouth daily.  01/29/12  Yes Historical Provider, MD  calcium carbonate (TUMS - DOSED IN MG ELEMENTAL CALCIUM) 500 MG chewable tablet Chew 1 tablet (200 mg of elemental calcium total) by mouth 3 (three) times daily. Patient not taking: Reported on 05/02/2015 01/12/15   Nita Sells, MD   BP 140/64 mmHg  Pulse 62  Temp(Src) 97.5 F (36.4 C) (Oral)  Resp 18  SpO2 97% Physical Exam  Constitutional: She appears well-developed and well-nourished.  HENT:  Head: Normocephalic and atraumatic.  Eyes: Conjunctivae and EOM are normal.  Neck: Normal range of motion.  Cardiovascular: Normal rate and regular rhythm.   Pulmonary/Chest: Effort normal and breath sounds normal. No stridor. No respiratory distress. She has no wheezes. She has no rales.  Abdominal: Soft. She exhibits no distension. There is no tenderness.  Musculoskeletal: Normal range of motion. She exhibits no edema or tenderness.  Neurological: She is alert.  Skin: Skin is warm and dry. No rash noted. No erythema.  Psychiatric: She has a normal mood and affect.  Nursing note and vitals reviewed.   ED Course  Procedures (including critical care time) Labs Review Labs Reviewed  CBC - Abnormal; Notable for the following:    Platelets 135 (*)    All other components within normal limits  COMPREHENSIVE METABOLIC PANEL - Abnormal; Notable for the following:    Glucose, Bld 111 (*)    Total Protein 6.1 (*)    Albumin 3.3 (*)    GFR calc non Af Amer 50 (*)    GFR calc Af Amer 58 (*)    All other components within normal limits  URINALYSIS, ROUTINE W REFLEX MICROSCOPIC (NOT AT [facility]) - Abnormal; Notable for the following:    Specific Gravity, Urine 1.004 (*)    All other components within  normal limits  TROPONIN I  TROPONIN I    Imaging Review Dg Chest 2 View  05/02/2015  CLINICAL DATA:  central chest pressure this morning lasting around 10 min. Pt. Pain-free upon arrival. Hx afib, HTN, CHF, breast CA, lymph node removal EXAM: CHEST  2 VIEW COMPARISON:  01/10/2015 FINDINGS: Normal mediastinum and cardiac silhouette. Normal pulmonary vasculature. No evidence of effusion, infiltrate, or pneumothorax. No acute bony abnormality. Stable 6 mm RIGHT lower lobe pulmonary nodule. IMPRESSION: No acute cardiopulmonary process. Electronically Signed   By: Suzy Bouchard M.D.   On: 05/02/2015 12:01   I have  personally reviewed and evaluated these images and lab results as part of my medical decision-making.   EKG Interpretation   Date/Time:  Sunday May 02 2015 13:30:50 EST Ventricular Rate:  59 PR Interval:  216 QRS Duration: 106 QT Interval:  414 QTC Calculation: 410 R Axis:   150 Text Interpretation:  Sinus rhythm Borderline prolonged PR interval  Posterior infarct, acute (LCx) Probable lateral infarct, age indeterminate  Baseline wander in lead(s) V2 V3 V4 V5 V6 Partial missing lead(s): V2 V3  V4 V5 V6 suspect lead placement abnormality, no e/o elevation Confirmed by  [facility] MD, Corene Cornea [phone]) on 05/02/2015 1:41:03 PM      MDM   Final diagnoses:  Chest pain, unspecified chest pain type   fleetign episode of CP, resolved now, ECG ok. No h/o ACS. Has had similar episodes in the past a/w afib. Exam benign.  Will check troponins, repeat ecg, electrolytes and monitor. Low HEART, will likely dc if all noramal.  Delta toponins negative. Repeat ecg's without ischemia.     Merrily Pew, MD 05/02/15 [phone]

## 2015-05-04 DIAGNOSIS — I951 Orthostatic hypotension: Secondary | ICD-10-CM | POA: Diagnosis not present

## 2015-05-04 DIAGNOSIS — F0390 Unspecified dementia without behavioral disturbance: Secondary | ICD-10-CM | POA: Diagnosis not present

## 2015-05-04 DIAGNOSIS — K219 Gastro-esophageal reflux disease without esophagitis: Secondary | ICD-10-CM | POA: Diagnosis not present

## 2015-05-04 DIAGNOSIS — M1990 Unspecified osteoarthritis, unspecified site: Secondary | ICD-10-CM | POA: Diagnosis not present

## 2015-05-04 DIAGNOSIS — M6281 Muscle weakness (generalized): Secondary | ICD-10-CM | POA: Diagnosis not present

## 2015-05-04 DIAGNOSIS — I5032 Chronic diastolic (congestive) heart failure: Secondary | ICD-10-CM | POA: Diagnosis not present

## 2015-05-06 DIAGNOSIS — K219 Gastro-esophageal reflux disease without esophagitis: Secondary | ICD-10-CM | POA: Diagnosis not present

## 2015-05-06 DIAGNOSIS — R0789 Other chest pain: Secondary | ICD-10-CM | POA: Diagnosis not present

## 2015-05-06 DIAGNOSIS — I5032 Chronic diastolic (congestive) heart failure: Secondary | ICD-10-CM | POA: Diagnosis not present

## 2015-05-06 DIAGNOSIS — I951 Orthostatic hypotension: Secondary | ICD-10-CM | POA: Diagnosis not present

## 2015-05-06 DIAGNOSIS — F0280 Dementia in other diseases classified elsewhere without behavioral disturbance: Secondary | ICD-10-CM | POA: Diagnosis not present

## 2015-05-06 DIAGNOSIS — F0390 Unspecified dementia without behavioral disturbance: Secondary | ICD-10-CM | POA: Diagnosis not present

## 2015-05-06 DIAGNOSIS — M1990 Unspecified osteoarthritis, unspecified site: Secondary | ICD-10-CM | POA: Diagnosis not present

## 2015-05-06 DIAGNOSIS — M6281 Muscle weakness (generalized): Secondary | ICD-10-CM | POA: Diagnosis not present

## 2015-05-06 DIAGNOSIS — G301 Alzheimer's disease with late onset: Secondary | ICD-10-CM | POA: Diagnosis not present

## 2015-05-12 DIAGNOSIS — M1990 Unspecified osteoarthritis, unspecified site: Secondary | ICD-10-CM | POA: Diagnosis not present

## 2015-05-12 DIAGNOSIS — F0390 Unspecified dementia without behavioral disturbance: Secondary | ICD-10-CM | POA: Diagnosis not present

## 2015-05-12 DIAGNOSIS — I951 Orthostatic hypotension: Secondary | ICD-10-CM | POA: Diagnosis not present

## 2015-05-12 DIAGNOSIS — I5032 Chronic diastolic (congestive) heart failure: Secondary | ICD-10-CM | POA: Diagnosis not present

## 2015-05-12 DIAGNOSIS — M6281 Muscle weakness (generalized): Secondary | ICD-10-CM | POA: Diagnosis not present

## 2015-05-12 DIAGNOSIS — K219 Gastro-esophageal reflux disease without esophagitis: Secondary | ICD-10-CM | POA: Diagnosis not present

## 2015-05-18 DIAGNOSIS — F0390 Unspecified dementia without behavioral disturbance: Secondary | ICD-10-CM | POA: Diagnosis not present

## 2015-05-18 DIAGNOSIS — M1990 Unspecified osteoarthritis, unspecified site: Secondary | ICD-10-CM | POA: Diagnosis not present

## 2015-05-18 DIAGNOSIS — I951 Orthostatic hypotension: Secondary | ICD-10-CM | POA: Diagnosis not present

## 2015-05-18 DIAGNOSIS — M6281 Muscle weakness (generalized): Secondary | ICD-10-CM | POA: Diagnosis not present

## 2015-05-18 DIAGNOSIS — K219 Gastro-esophageal reflux disease without esophagitis: Secondary | ICD-10-CM | POA: Diagnosis not present

## 2015-05-18 DIAGNOSIS — I5032 Chronic diastolic (congestive) heart failure: Secondary | ICD-10-CM | POA: Diagnosis not present

## 2015-05-21 DIAGNOSIS — M6281 Muscle weakness (generalized): Secondary | ICD-10-CM | POA: Diagnosis not present

## 2015-05-21 DIAGNOSIS — K219 Gastro-esophageal reflux disease without esophagitis: Secondary | ICD-10-CM | POA: Diagnosis not present

## 2015-05-21 DIAGNOSIS — I951 Orthostatic hypotension: Secondary | ICD-10-CM | POA: Diagnosis not present

## 2015-05-21 DIAGNOSIS — M1990 Unspecified osteoarthritis, unspecified site: Secondary | ICD-10-CM | POA: Diagnosis not present

## 2015-05-21 DIAGNOSIS — I5032 Chronic diastolic (congestive) heart failure: Secondary | ICD-10-CM | POA: Diagnosis not present

## 2015-05-21 DIAGNOSIS — F0390 Unspecified dementia without behavioral disturbance: Secondary | ICD-10-CM | POA: Diagnosis not present

## 2015-05-26 DIAGNOSIS — R05 Cough: Secondary | ICD-10-CM | POA: Diagnosis not present

## 2015-05-27 DIAGNOSIS — J069 Acute upper respiratory infection, unspecified: Secondary | ICD-10-CM | POA: Diagnosis not present

## 2015-05-28 DIAGNOSIS — F0390 Unspecified dementia without behavioral disturbance: Secondary | ICD-10-CM | POA: Diagnosis not present

## 2015-05-28 DIAGNOSIS — I951 Orthostatic hypotension: Secondary | ICD-10-CM | POA: Diagnosis not present

## 2015-05-28 DIAGNOSIS — K219 Gastro-esophageal reflux disease without esophagitis: Secondary | ICD-10-CM | POA: Diagnosis not present

## 2015-05-28 DIAGNOSIS — I5032 Chronic diastolic (congestive) heart failure: Secondary | ICD-10-CM | POA: Diagnosis not present

## 2015-05-28 DIAGNOSIS — M1990 Unspecified osteoarthritis, unspecified site: Secondary | ICD-10-CM | POA: Diagnosis not present

## 2015-05-28 DIAGNOSIS — M6281 Muscle weakness (generalized): Secondary | ICD-10-CM | POA: Diagnosis not present

## 2015-06-22 ENCOUNTER — Encounter (HOSPITAL_COMMUNITY): Payer: Self-pay

## 2015-06-22 ENCOUNTER — Observation Stay (HOSPITAL_COMMUNITY)
Admission: EM | Admit: 2015-06-22 | Discharge: 2015-06-23 | Disposition: A | Discharge status: Home / Self Care | Payer: Medicare Other | Attending: Cardiology | Admitting: Cardiology

## 2015-06-22 ENCOUNTER — Emergency Department (HOSPITAL_COMMUNITY): Payer: Medicare Other

## 2015-06-22 DIAGNOSIS — I809 Phlebitis and thrombophlebitis of unspecified site: Secondary | ICD-10-CM | POA: Insufficient documentation

## 2015-06-22 DIAGNOSIS — I10 Essential (primary) hypertension: Secondary | ICD-10-CM | POA: Diagnosis not present

## 2015-06-22 DIAGNOSIS — K649 Unspecified hemorrhoids: Secondary | ICD-10-CM | POA: Insufficient documentation

## 2015-06-22 DIAGNOSIS — Z87891 Personal history of nicotine dependence: Secondary | ICD-10-CM | POA: Diagnosis not present

## 2015-06-22 DIAGNOSIS — I5032 Chronic diastolic (congestive) heart failure: Secondary | ICD-10-CM | POA: Diagnosis not present

## 2015-06-22 DIAGNOSIS — Z853 Personal history of malignant neoplasm of breast: Secondary | ICD-10-CM | POA: Diagnosis not present

## 2015-06-22 DIAGNOSIS — E162 Hypoglycemia, unspecified: Secondary | ICD-10-CM | POA: Insufficient documentation

## 2015-06-22 DIAGNOSIS — I4891 Unspecified atrial fibrillation: Secondary | ICD-10-CM

## 2015-06-22 DIAGNOSIS — H9190 Unspecified hearing loss, unspecified ear: Secondary | ICD-10-CM | POA: Insufficient documentation

## 2015-06-22 DIAGNOSIS — R079 Chest pain, unspecified: Secondary | ICD-10-CM | POA: Diagnosis not present

## 2015-06-22 DIAGNOSIS — K219 Gastro-esophageal reflux disease without esophagitis: Secondary | ICD-10-CM | POA: Diagnosis not present

## 2015-06-22 DIAGNOSIS — I4892 Unspecified atrial flutter: Secondary | ICD-10-CM | POA: Diagnosis not present

## 2015-06-22 DIAGNOSIS — F419 Anxiety disorder, unspecified: Secondary | ICD-10-CM | POA: Diagnosis not present

## 2015-06-22 DIAGNOSIS — N390 Urinary tract infection, site not specified: Secondary | ICD-10-CM | POA: Insufficient documentation

## 2015-06-22 DIAGNOSIS — R000 Tachycardia, unspecified: Secondary | ICD-10-CM | POA: Diagnosis not present

## 2015-06-22 DIAGNOSIS — E876 Hypokalemia: Secondary | ICD-10-CM

## 2015-06-22 DIAGNOSIS — E785 Hyperlipidemia, unspecified: Secondary | ICD-10-CM | POA: Diagnosis not present

## 2015-06-22 DIAGNOSIS — M1990 Unspecified osteoarthritis, unspecified site: Secondary | ICD-10-CM | POA: Insufficient documentation

## 2015-06-22 LAB — CBC
HEMATOCRIT: 39.6 % (ref 36.0–46.0)
Hemoglobin: 13 g/dL (ref 12.0–15.0)
MCH: 28.8 pg (ref 26.0–34.0)
MCHC: 32.8 g/dL (ref 30.0–36.0)
MCV: 87.6 fL (ref 78.0–100.0)
Platelets: 128 10*3/uL — ABNORMAL LOW (ref 150–400)
RBC: 4.52 MIL/uL (ref 3.87–5.11)
RDW: 12.8 % (ref 11.5–15.5)
WBC: 4 10*3/uL (ref 4.0–10.5)

## 2015-06-22 LAB — BASIC METABOLIC PANEL
Anion gap: 10 (ref 5–15)
BUN: 20 mg/dL (ref 6–20)
CHLORIDE: 103 mmol/L (ref 101–111)
CO2: 26 mmol/L (ref 22–32)
Calcium: 8.9 mg/dL (ref 8.9–10.3)
Creatinine, Ser: 1.21 mg/dL — ABNORMAL HIGH (ref 0.44–1.00)
GFR calc non Af Amer: 39 mL/min — ABNORMAL LOW (ref >60)
GFR, EST AFRICAN AMERICAN: 46 mL/min — AB (ref >60)
Glucose, Bld: 165 mg/dL — ABNORMAL HIGH (ref 65–99)
POTASSIUM: 3.3 mmol/L — AB (ref 3.5–5.1)
SODIUM: 139 mmol/L (ref 135–145)

## 2015-06-22 LAB — I-STAT TROPONIN, ED: Troponin i, poc: 0 ng/mL (ref 0.00–0.08)

## 2015-06-22 LAB — BRAIN NATRIURETIC PEPTIDE: B NATRIURETIC PEPTIDE 5: 144.5 pg/mL — AB (ref 0.0–100.0)

## 2015-06-22 MED ORDER — METOPROLOL TARTRATE 1 MG/ML IV SOLN
5.0000 mg | Freq: Once | INTRAVENOUS | Status: DC
  Filled 2015-06-22: qty 5

## 2015-06-22 MED ORDER — DIGOXIN 0.25 MG/ML IJ SOLN
0.2500 mg | Freq: Once | INTRAMUSCULAR | Status: AC
  Administered 2015-06-22: 0.25 mg via INTRAVENOUS
  Filled 2015-06-22: qty 2

## 2015-06-22 NOTE — ED Provider Notes (Signed)
CSN: [id number]     Arrival date & time 06/22/15  1849 History   First MD Initiated Contact with Patient 06/22/15 1852     Chief Complaint  Patient presents with  . Tachycardia     (Consider location/radiation/quality/duration/timing/severity/associated sxs/prior Treatment) HPI Patient presents with her daughter who assists with the history of present illness.  Patient has episodes of chest pain with some frequency, and today had a similar episode. Today she described chest pain to nurses at her nursing home. Currently she has no chest pain. It is unclear when or how the pain started,, lasted, the patient typically points to pain in her back, where she has chronic neck pain as her primary concern. Currently she denies discomfort, nausea, pain. Level V caveat secondary to dementia  Past Medical History  Diagnosis Date  . Chronic diastolic CHF (congestive heart failure) ([place])     a. echo 09/12/13 EF Q000111Q, grade 1 diastolic dysf, mild LVOT obstr, moderate MR, elevated filling pressure  . Hypoglycemia   . Hard of hearing   . Phlebitis     l leg  . Hypertension   . Frequent UTI   . Enlarged liver   . Hemorrhoids   . Dyslipidemia   . OA (osteoarthritis)     hip and back  . Atrial fibrillation ([place])     a. h/o PAF, in NSR during admission 09/11/13  . Allergic rhinitis   . GERD (gastroesophageal reflux disease)   . Prediabetes   . Arrhythmia     a. office to call pt on 09/15/13 for 30 day event monitor  . Breast cancer ([place])   . Mitral regurgitation     a. moderate MR by echo 09/12/2013   Past Surgical History  Procedure Laterality Date  . Abdominal hysterectomy    . Bladder suspension    . Breast lumpectomy    . Foot surgery      left foot  . Inner ear surgery    . Cardiac catheterization  29's    [facility]   Family History  Problem Relation Age of Onset  . Cerebral palsy Sister   . Alzheimer's disease Brother   . Hypertension Father   . Heart disease Brother    Social  History  Substance Use Topics  . Smoking status: Former Research scientist (life sciences)  . Smokeless tobacco: Never Used  . Alcohol Use: No   OB History    No data available     Review of Systems  Unable to perform ROS: Dementia      Allergies  Contrast media and Pyridium  Home Medications   Prior to Admission medications   Medication Sig Start Date End Date Taking? Authorizing Provider  acetaminophen (TYLENOL) 500 MG tablet Take 500 mg by mouth every 4 (four) hours as needed for mild pain.    Historical Provider, MD  alum & mag hydroxide-simeth ([place]) 200-200-20 MG/5ML suspension Take 30 mLs by mouth every 6 (six) hours as needed for indigestion or heartburn.    Historical Provider, MD  aspirin EC 81 MG tablet Take 81 mg by mouth daily.    Historical Provider, MD  calcium carbonate (TUMS - DOSED IN MG ELEMENTAL CALCIUM) 500 MG chewable tablet Chew 1 tablet (200 mg of elemental calcium total) by mouth 3 (three) times daily. Patient not taking: Reported on 05/02/2015 01/12/15   Nita Sells, MD  furosemide (LASIX) 40 MG tablet Take 40 mg by mouth 2 (two) times daily.  04/09/12   Historical Provider, MD  guaifenesin (ROBITUSSIN) 100 MG/5ML syrup Take 200 mg by mouth every 6 (six) hours as needed for cough.    Historical Provider, MD  ibuprofen (ADVIL,MOTRIN) 200 MG tablet Take 200 mg by mouth every 6 (six) hours as needed (back pain).    Historical Provider, MD  KLOR-CON M20 20 MEQ tablet Take 20 mEq by mouth 2 (two) times daily.  03/23/12   Historical Provider, MD  loperamide (IMODIUM) 2 MG capsule Take 2 mg by mouth as needed for diarrhea or loose stools.    Historical Provider, MD  magnesium hydroxide (MILK OF MAGNESIA) 400 MG/5ML suspension Take 30 mLs by mouth at bedtime as needed for mild constipation.    Historical Provider, MD  Neomycin-Bacitracin-Polymyxin (HCA TRIPLE ANTIBIOTIC OINTMENT EX) Apply 1 application topically daily as needed (skintear/abrasions).    Historical Provider, MD   pantoprazole (PROTONIX) 40 MG tablet Take 40 mg by mouth daily. 10/19/14   Historical Provider, MD  TOPROL XL 50 MG 24 hr tablet Take 50 mg by mouth daily.  01/29/12   Historical Provider, MD   BP 114/87 mmHg  Pulse 144  Temp(Src) 98.4 F (36.9 C) (Oral)  Resp 23  SpO2 96% Physical Exam  Constitutional: She is oriented to person, place, and time. She appears well-developed and well-nourished. No distress.  HENT:  Head: Normocephalic and atraumatic.  Eyes: Conjunctivae and EOM are normal.  Cardiovascular: Tachycardia present.   Pulmonary/Chest: Effort normal and breath sounds normal. No stridor. No respiratory distress.  Abdominal: She exhibits no distension.  Musculoskeletal: She exhibits no edema.  Neurological: She is alert and oriented to person, place, and time. She displays atrophy. She displays no tremor. No cranial nerve deficit. She exhibits normal muscle tone. She displays no seizure activity.  Skin: Skin is warm and dry.  Psychiatric: Her mood appears anxious. Cognition and memory are impaired.  Nursing note and vitals reviewed.   ED Course  Procedures (including critical care time) Labs Review Labs Reviewed  BASIC METABOLIC PANEL - Abnormal; Notable for the following:    Potassium 3.3 (*)    Glucose, Bld 165 (*)    Creatinine, Ser 1.21 (*)    GFR calc non Af Amer 39 (*)    GFR calc Af Amer 46 (*)    All other components within normal limits  CBC - Abnormal; Notable for the following:    Platelets 128 (*)    All other components within normal limits  BRAIN NATRIURETIC PEPTIDE - Abnormal; Notable for the following:    B Natriuretic Peptide 144.5 (*)    All other components within normal limits  Randolm Idol, ED    Imaging Review Dg Chest Port 1 View  06/22/2015  CLINICAL DATA:  82 year old female with chest pain EXAM: PORTABLE CHEST 1 VIEW COMPARISON:  Chest radiograph dated 05/02/2015 FINDINGS: Single-view of chest does not demonstrate a focal  consolidation. There is no pleural effusion or pneumothorax. The cardiac silhouette is within normal limits. No acute osseous pathology identified. A small hiatal hernia noted. Left breast surgical clips. IMPRESSION: No active disease. Electronically Signed   By: Anner Crete M.D.   On: 06/22/2015 19:55   I have personally reviewed and evaluated these images and lab results as part of my medical decision-making.   EKG Interpretation   Date/Time:  Tuesday June 22 2015 18:50:17 EDT Ventricular Rate:  142 PR Interval:    QRS Duration: 88 QT Interval:  294 QTC Calculation: 452 R Axis:   12 Text Interpretation:  Supraventricular  tachycardia Repolarization  abnormality, prob rate related Atrial fibrillation abn\E\ Confirmed by  Carmin Muskrat  MD [phone]) on 06/22/2015 7:25:21 PM     update: Patient persistently tachycardic, blood pressure no lower than on arrival, 100/60. I discussed the need for intervention with patient's family members. He states that when she has received IV beta blocker, she has adverse reaction, with loss of consciousness, accelerated heart rate.  Update: I discussed patient's case with our cardiology colleagues given the patient's hypotension, tachycardia,persistent atrial fibrillation.   11:00 PM Patient is tachycardic, borderline hypotensive, though without any ongoing complaints. After discussion with our cardiology colleagues, the patient will receive dig.  She will require admission given her persistent hypotension and rapid afib.   MDM  Elderly female with history of dementia presents from nursing facility after complaining of chest pain. Notably, the patient has resolution of her pain prior to my evaluation, and essentially is pain-free 4 hours after arrival in the emergency department. However, the patient has sustained atrial fibrillation, with rapid heart rate. Patient has been taking beta blocker already, and given concern for her hypotension, I  discussed her case with our cardiology colleagues. Patient was started on digoxin, admitted for further evaluation and management.  Carmin Muskrat, MD 06/22/15 2302

## 2015-06-22 NOTE — H&P (Signed)
CARDIOLOGY ADMISSION NOTE  Patient ID: Alexis Winters MRN: [id number] DOB/AGE: 1927/07/01 82 y.o.  Admit date: 06/22/2015 Primary Physician   Thamas Jaegers, NP Primary Cardiologist   Dr. Acie Fredrickson Chief Complaint    CHEST PAIN  HPI:  The patient has a history of atrial fib and diastolic HF.    She has had chest pain in the past.  She was in the ED for this in January.  The history is compromised as the patient has dementia and lives in a memory care unit.  She reported chest pain and her unit called the EMS today.  When they arrived her heart rate was elevated.  She came to the ED and has a narrow complex tachycardia with a rate of 150.  This is likely flutter.  However, BPs are soft ranging from 95 - 75 systolic in the ED.  She had an initial negative enzyme and is currently pain free.  She denies SOB.  She does feel her heart rate in her neck but it is not particularly bothering her.  She cannot give any details about the pain.  Past Medical History  Diagnosis Date  . Chronic diastolic CHF (congestive heart failure) ([place])     a. echo 09/12/13 EF Q000111Q, grade 1 diastolic dysf, mild LVOT obstr, moderate MR, elevated filling pressure  . Hypoglycemia   . Hard of hearing   . Phlebitis     l leg  . Hypertension   . Frequent UTI   . Enlarged liver   . Hemorrhoids   . Dyslipidemia   . OA (osteoarthritis)     hip and back  . Atrial fibrillation ([place])     a. h/o PAF, in NSR during admission 09/11/13  . Allergic rhinitis   . GERD (gastroesophageal reflux disease)   . Prediabetes   . Arrhythmia     a. office to call pt on 09/15/13 for 30 day event monitor  . Breast cancer ([place])   . Mitral regurgitation     a. moderate MR by echo 09/12/2013    Past Surgical History  Procedure Laterality Date  . Abdominal hysterectomy    . Bladder suspension    . Breast lumpectomy    . Foot surgery      left foot  . Inner ear surgery    . Cardiac catheterization  1980's    [facility]    Allergies    Allergen Reactions  . Contrast Media [Iodinated Diagnostic Agents] Other (See Comments)    Cardiac arrest  . Metoprolol Other (See Comments)    Per daughter "causes her heart to race"  . Pyridium [Phenazopyridine Hcl] Swelling    unknown   No current facility-administered medications on file prior to encounter.   Current Outpatient Prescriptions on File Prior to Encounter  Medication Sig Dispense Refill  . acetaminophen (TYLENOL) 500 MG tablet Take 500 mg by mouth every 4 (four) hours as needed for mild pain.    Marland Kitchen alum & mag hydroxide-simeth (MINTO[place]) [id number] MG/5ML suspension Take 30 mLs by mouth every 6 (six) hours as needed for indigestion or heartburn.    Marland Kitchen aspirin EC 81 MG tablet Take 81 mg by mouth daily.    . furosemide (LASIX) 40 MG tablet Take 40 mg by mouth 2 (two) times daily.     Marland Kitchen guaifenesin (ROBITUSSIN) 100 MG/5ML syrup Take 200 mg by mouth every 6 (six) hours as needed for cough.    Marland Kitchen KLOR-CON M20 20 MEQ tablet Take 20  mEq by mouth 2 (two) times daily.     Marland Kitchen loperamide (IMODIUM) 2 MG capsule Take 2 mg by mouth as needed for diarrhea or loose stools.    . magnesium hydroxide (MILK OF MAGNESIA) 400 MG/5ML suspension Take 30 mLs by mouth at bedtime as needed for mild constipation.    Marland Kitchen Neomycin-Bacitracin-Polymyxin (HCA TRIPLE ANTIBIOTIC OINTMENT EX) Apply 1 application topically daily as needed (skintear/abrasions).    . pantoprazole (PROTONIX) 40 MG tablet Take 40 mg by mouth daily.  3  . TOPROL XL 50 MG 24 hr tablet Take 50 mg by mouth daily. Name Brand ONLY    . ibuprofen (ADVIL,MOTRIN) 200 MG tablet Take 200 mg by mouth every 6 (six) hours as needed (back pain). Reported on 06/22/2015     Social History   Social History  . Marital Status: Widowed    Spouse Name: N/A  . Number of Children: N/A  . Years of Education: N/A   Occupational History  . Not on file.   Social History Main Topics  . Smoking status: Former Research scientist (life sciences)  . Smokeless tobacco: Never Used  .  Alcohol Use: No  . Drug Use: No  . Sexual Activity: No   Other Topics Concern  . Not on file   Social History Narrative    Family History  Problem Relation Age of Onset  . Cerebral palsy Sister   . Alzheimer's disease Brother   . Hypertension Father   . Heart disease Brother      ROS:  Unable to obtain secondary to dementia.  Physical Exam: Blood pressure 98/65, pulse 144, temperature 98.4 F (36.9 C), temperature source Oral, resp. rate 27, SpO2 96 %.  GENERAL:  Well appearing HEENT:  Pupils equal round and reactive, fundi not visualized, oral mucosa unremarkable NECK:  No jugular venous distention, waveform within normal limits, carotid upstroke brisk and symmetric, no bruits, no thyromegaly LYMPHATICS:  No cervical, inguinal adenopathy LUNGS:  Clear to auscultation bilaterally BACK:  No CVA tenderness CHEST:  Unremarkable HEART:  PMI not displaced or sustained,S1 and S2 within normal limits,   no clicks, no rubs, no rmurs, irregular ABD:  Flat, positive bowel sounds normal in frequency in pitch, no bruits, no rebound, no guarding, no midline pulsatile mass, no hepatomegaly, no splenomegaly EXT:  2 plus pulses throughout, no edema, no cyanosis no clubbing SKIN:  No rashes no nodules NEURO:  Cranial nerves II through XII grossly intact, motor grossly intact throughout PSYCH:  Cognitively intact, oriented to person place.  Pleasantly confused.   Labs: Lab Results  Component Value Date   BUN 20 06/22/2015   Lab Results  Component Value Date   CREATININE 1.21* 06/22/2015   Lab Results  Component Value Date   NA 139 06/22/2015   K 3.3* 06/22/2015   CL 103 06/22/2015   CO2 26 06/22/2015   Lab Results  Component Value Date   TROPONINI <0.03 05/02/2015   Lab Results  Component Value Date   WBC 4.0 06/22/2015   HGB 13.0 06/22/2015   HCT 39.6 06/22/2015   MCV 87.6 06/22/2015   PLT 128* 06/22/2015   No results found for: CHOL, HDL, LDLCALC, LDLDIRECT, TRIG,  CHOLHDL Lab Results  Component Value Date   ALT 20 05/02/2015   AST 30 05/02/2015   ALKPHOS 89 05/02/2015   BILITOT 0.9 05/02/2015      Radiology:  CXR:  Single-view of chest does not demonstrate a focal consolidation. There is no pleural effusion or pneumothorax.  The cardiac silhouette is within normal limits. No acute osseous pathology identified. A small hiatal hernia noted. Left breast surgical clips.  EKG:  Atrial fib with 2:1 conduction.  No acute ST T wave changes.    ASSESSMENT AND PLAN:    ATRIAL FIB:   The patient presents with atrial flutter with rapid rate.  Her low BP makes management difficult.  Also, her daughter is clear that she does not want her mother to get generic metoprolol.  It is unclear when her arrhythmia started.  She is not on anticoagulation for unclear reasons but her daughter says that this is been the decision of her primary provider for years.  She did mention that she had problems in the past on warfarin.  For tonight I will give a dose of IV digoxin.  If she does not go spontaneously into sinus she might need IV amiodarone.   Of note I put in the pharmacy orders a comment that the patient cannot have generic metoprolol.  Also, I wrote in the text not to give metoprolol if her SBP is less than 95.   CKD:    Follow creatinine.  HYOPKALEMIA:   Supplement potassium.   CHRONIC DIASTOLIC HF:  She seems to be euvolemic.  No change in therapy.  CHEST PAIN:  We will cycle enzymes.  However, conservative therapy will be indicated if she remains without further pain or any EKG changes as we control her rate.   DNR: She has previously been a DNR.  I clarified this with her daughter and she confirms. This.     SignedMinus Breeding 06/22/2015, 9:08 PM

## 2015-06-22 NOTE — ED Notes (Signed)
Provider at bedside

## 2015-06-22 NOTE — ED Notes (Signed)
Pt arrived via EMS c/o sinus tach rate 150's.  Pt denies chest pain, posterior head pain without injury.  Left arm restriction from previous node removal.  Pt has history of dementia per EMS.

## 2015-06-22 NOTE — ED Notes (Addendum)
Pts family member states that the pt is allergic to metoprolol XR - generic - she can only have the name brand. Family member states that metoprolol "send her heart off, it was racing and she has in the hospital and she blacked out". Dr. Vanita Panda informed.

## 2015-06-22 NOTE — ED Notes (Signed)
Pt is from [place]

## 2015-06-23 DIAGNOSIS — F419 Anxiety disorder, unspecified: Secondary | ICD-10-CM | POA: Diagnosis not present

## 2015-06-23 DIAGNOSIS — I5032 Chronic diastolic (congestive) heart failure: Secondary | ICD-10-CM | POA: Diagnosis not present

## 2015-06-23 DIAGNOSIS — I483 Typical atrial flutter: Secondary | ICD-10-CM | POA: Diagnosis not present

## 2015-06-23 DIAGNOSIS — R7989 Other specified abnormal findings of blood chemistry: Secondary | ICD-10-CM | POA: Diagnosis not present

## 2015-06-23 DIAGNOSIS — I4892 Unspecified atrial flutter: Secondary | ICD-10-CM | POA: Diagnosis not present

## 2015-06-23 DIAGNOSIS — E162 Hypoglycemia, unspecified: Secondary | ICD-10-CM | POA: Diagnosis not present

## 2015-06-23 LAB — CBC
HCT: 37.6 % (ref 36.0–46.0)
Hemoglobin: 12.5 g/dL (ref 12.0–15.0)
MCH: 29 pg (ref 26.0–34.0)
MCHC: 33.2 g/dL (ref 30.0–36.0)
MCV: 87.2 fL (ref 78.0–100.0)
PLATELETS: 143 10*3/uL — AB (ref 150–400)
RBC: 4.31 MIL/uL (ref 3.87–5.11)
RDW: 12.9 % (ref 11.5–15.5)
WBC: 6.1 10*3/uL (ref 4.0–10.5)

## 2015-06-23 LAB — MRSA PCR SCREENING: MRSA by PCR: NEGATIVE

## 2015-06-23 LAB — BASIC METABOLIC PANEL
Anion gap: 9 (ref 5–15)
BUN: 23 mg/dL — AB (ref 6–20)
CHLORIDE: 106 mmol/L (ref 101–111)
CO2: 24 mmol/L (ref 22–32)
CREATININE: 1.07 mg/dL — AB (ref 0.44–1.00)
Calcium: 9 mg/dL (ref 8.9–10.3)
GFR calc Af Amer: 53 mL/min — ABNORMAL LOW (ref >60)
GFR calc non Af Amer: 46 mL/min — ABNORMAL LOW (ref >60)
Glucose, Bld: 116 mg/dL — ABNORMAL HIGH (ref 65–99)
Potassium: 3.9 mmol/L (ref 3.5–5.1)
SODIUM: 139 mmol/L (ref 135–145)

## 2015-06-23 LAB — TSH: TSH: 3.076 u[IU]/mL (ref 0.350–4.500)

## 2015-06-23 LAB — TROPONIN I
TROPONIN I: 0.22 ng/mL — AB (ref <0.031)
TROPONIN I: 0.62 ng/mL — AB (ref <0.031)
Troponin I: 0.65 ng/mL (ref <0.031)

## 2015-06-23 MED ORDER — POTASSIUM CHLORIDE CRYS ER 20 MEQ PO TBCR
20.0000 meq | EXTENDED_RELEASE_TABLET | Freq: Two times a day (BID) | ORAL | Status: DC
  Administered 2015-06-23 (×2): 20 meq via ORAL
  Filled 2015-06-23 (×2): qty 1

## 2015-06-23 MED ORDER — FUROSEMIDE 40 MG PO TABS
40.0000 mg | ORAL_TABLET | Freq: Two times a day (BID) | ORAL | Status: DC
  Administered 2015-06-23: 40 mg via ORAL
  Filled 2015-06-23: qty 1

## 2015-06-23 MED ORDER — ASPIRIN EC 81 MG PO TBEC
81.0000 mg | DELAYED_RELEASE_TABLET | Freq: Every day | ORAL | Status: DC
  Administered 2015-06-23: 81 mg via ORAL
  Filled 2015-06-23: qty 1

## 2015-06-23 MED ORDER — ONDANSETRON HCL 4 MG/2ML IJ SOLN: 4.0000 mg | Freq: Four times a day (QID) | INTRAMUSCULAR | Status: DC | PRN

## 2015-06-23 MED ORDER — METOPROLOL SUCCINATE ER 50 MG PO TB24
50.0000 mg | ORAL_TABLET | Freq: Every day | ORAL | Status: DC
  Filled 2015-06-23: qty 1

## 2015-06-23 MED ORDER — ACETAMINOPHEN 500 MG PO TABS: 500.0000 mg | ORAL_TABLET | ORAL | Status: DC | PRN

## 2015-06-23 MED ORDER — METOPROLOL SUCCINATE ER 50 MG PO TB24: 50.0000 mg | ORAL_TABLET | Freq: Every day | ORAL | Status: DC

## 2015-06-23 MED ORDER — PANTOPRAZOLE SODIUM 40 MG PO TBEC
40.0000 mg | DELAYED_RELEASE_TABLET | Freq: Every day | ORAL | Status: DC
  Administered 2015-06-23: 40 mg via ORAL
  Filled 2015-06-23: qty 1

## 2015-06-23 MED ORDER — POTASSIUM CHLORIDE CRYS ER 20 MEQ PO TBCR
20.0000 meq | EXTENDED_RELEASE_TABLET | Freq: Once | ORAL | Status: AC
  Administered 2015-06-23: 20 meq via ORAL
  Filled 2015-06-23: qty 1

## 2015-06-23 NOTE — Care Management Obs Status (Signed)
[place] NOTIFICATION   Patient Details  Name: Alexis Winters MRN: [id number] Date of Birth: 1928/02/20   Medicare Observation Status Notification Given:  Yes (Atrial Flutter. )    Bethena Roys, RN 06/23/2015, 12:12 PM

## 2015-06-23 NOTE — Progress Notes (Signed)
Pt reported off to [facility] and spoke with Caesar Chestnut. Will report off to [place] incoming for [facility] unit.

## 2015-06-23 NOTE — NC FL2 (Signed)
[place] LEVEL OF CARE SCREENING TOOL     IDENTIFICATION  Patient Name: Alexis Winters Birthdate: [birth year] Sex: female Admission Date (Current Location): 06/22/2015  [facility] and [place] Number:  Herbalist and Address:  [facility] [place], [place] [address] [place], [place] [address]      Provider Number: [id number]  Attending Physician Name and Address:  Minus Breeding, MD  Relative Name and Phone Number:       Current Level of Care: Hospital Recommended Level of Care: [place], Memory Care Prior Approval Number:    Date Approved/Denied:   PASRR Number:    Discharge Plan: Other (Comment) ([place])    Current Diagnoses: Patient Active Problem List   Diagnosis Date Noted  . Atrial flutter ([place]) 06/22/2015  . Near syncope 01/10/2015  . Neck pain on left side 01/10/2015  . Pulmonary nodule, right 01/10/2015  . Leukocytosis 01/10/2015  . Hypocalcemia 01/10/2015  . Dyspnea 01/10/2015  . Chest pain 09/11/2013  . Bilateral neck pain 09/11/2013  . Angina at rest [facility]) 09/11/2013  . Leg edema 09/05/2013  . Failure to thrive 09/05/2013  . Paroxysmal atrial fibrillation ([place]) 09/05/2013  . Chronic diastolic CHF (congestive heart failure) ([place]) 09/05/2013    Orientation RESPIRATION BLADDER Height & Weight     Self, Situation, Place  Normal Continent Weight: 62.778 kg (138 lb 6.4 oz) Height:  5\' 2"  (157.5 cm)  BEHAVIORAL SYMPTOMS/MOOD NEUROLOGICAL BOWEL NUTRITION STATUS   (NONE)  (NONE) Continent Diet  AMBULATORY STATUS COMMUNICATION OF NEEDS Skin   Limited Assist Verbally Normal                       Personal Care Assistance Level of Assistance  Bathing, Feeding, Dressing Bathing Assistance: Limited assistance Feeding assistance: Independent Dressing Assistance: Limited assistance     Functional Limitations Info  Sight, Hearing, Speech Sight Info: Adequate Hearing Info:  Adequate Speech Info: Adequate    SPECIAL CARE FACTORS FREQUENCY  PT (By licensed PT), OT (By licensed OT)     PT Frequency: 3/week OT Frequency: 3/week            Contractures Contractures Info: Not present    Additional Factors Info  Code Status, Allergies Code Status Info: DNR Allergies Info: Contrast Media, Metoprolol, Pyridium           Current Medications (06/23/2015):  This is the current hospital active medication list Current Facility-Administered Medications  Medication Dose Route Frequency Provider Last Rate Last Dose  . acetaminophen (TYLENOL) tablet 500 mg  500 mg Oral Q4H PRN Minus Breeding, MD      . aspirin EC tablet 81 mg  81 mg Oral Daily Minus Breeding, MD   81 mg at 06/23/15 [id number]  . furosemide (LASIX) tablet 40 mg  40 mg Oral BID Minus Breeding, MD   40 mg at 06/23/15 [id number]  . NON FORMULARY 50 mg  50 mg Mouth/Throat Daily [place], [place]      . ondansetron [facility]) injection 4 mg  4 mg Intravenous Q6H PRN Minus Breeding, MD      . pantoprazole (PROTONIX) EC tablet 40 mg  40 mg Oral Daily Minus Breeding, MD   40 mg at 06/23/15 [phone]  . potassium chloride SA (K-DUR,KLOR-CON) CR tablet 20 mEq  20 mEq Oral BID Minus Breeding, MD   20 mEq at 06/23/15 [id number]     Discharge Medications: Please see discharge summary  for a list of discharge medications.  Relevant Imaging Results:  Relevant Lab Results:   Additional Information SSN: [id number]  Rigoberto Noel, LCSW

## 2015-06-23 NOTE — Clinical Social Work Note (Signed)
Per MD patient ready to DC back to [facility] memory care. RN, patient/family, and facility notified of patient's DC. RN given number for report. DC packet on patient's chart. Daughter will transport by personal vehicle. Facility states they do not require an FL2 for the patient to return due to observation status of less than 24hrs. CSW signing off at this time.   Liz Beach MSW, [place], [place], [id number]

## 2015-06-23 NOTE — Discharge Summary (Signed)
Discharge Summary    Patient ID: Alexis Winters,  MRN: [id number], DOB/AGE: 1927-05-28 82 y.o.  Admit date: 06/22/2015 Discharge date: 06/23/2015  Primary Care Provider: Thamas Jaegers Primary Cardiologist: Dr. Acie Fredrickson  Discharge Diagnoses    Principal Problem:   Atrial flutter, paroxysmal [facility]) Active Problems:   Chronic diastolic CHF (congestive heart failure) ([place])   History of Present Illness    Alexis Winters is a 82 y.o. female with past medical history of chronic diastolic CHF (EF [id number] by echo in 08/2013), paroxysmal atrial fibrillation (not on anticoagulation), HTN, HLD, OSA, moderate MR, and dementia who presented to [facility] on 06/22/2015 for palpitations and chest discomfort.  While in the ED, she was noted to have a narrow complex tachycardia, concerning for atrial flutter. However, while there she was hypotensive with SBP in the 70's - 90's. She reported feeling her HR in her neck at that time but denied any associated symptoms.  She was given IV Digoxin while in the ED and admitted for further observation. She was not started on anticoagulation, as this was dicussed with the patient's family and PCP in the past, and they did not wish for her to be on anticoagulation. This patients CHA2DS2-VASc Score and unadjusted Ischemic Stroke Rate (% per year) is equal to 7.2 % stroke rate/year from a score of 5 (CHF, HTN, Female, Age (2)).   Hospital Course     Consultants: None   Overnight, she denied any repeat symptoms. She converted to NSR overnight and this was confirmed by EKG. TSH was within normal limits. Her potassium was slightly low at 3.3 and this was appropriately replaced. Troponin values were 0.00, 0.22, and 0.62, thought to be secondary to her elevated HR. With her baseline dementia and DNR status, she would not be a good cardiac catheterization candidate. Therefore conservative therapy was pursued with her non-ischemic EKG and lack of anginal  symptoms.  Her volume status was evolemic and she will continue with her current medication regimen. She was last examined by Dr. Angelena Form and deemed stable for discharge. She will return to [facility] later today and will follow-up with Dr. Acie Fredrickson as an outpatient. _____________  Discharge Vitals Blood pressure 135/48, pulse 68, temperature 98.6 F (37 C), temperature source Oral, resp. rate 15, height 5\' 2"  (1.575 m), weight 138 lb 6.4 oz (62.778 kg), SpO2 100 %.  Filed Weights   06/23/15 0016  Weight: 138 lb 6.4 oz (62.778 kg)    Labs & Radiologic Studies     CBC  Recent Labs  06/22/15 1936 06/23/15 0638  WBC 4.0 6.1  HGB 13.0 12.5  HCT 39.6 37.6  MCV 87.6 87.2  PLT 128* [id number]*   Basic Metabolic Panel  Recent Labs  06/22/15 1936 06/23/15 0638  NA 139 139  K 3.3* 3.9  CL 103 106  CO2 26 24  GLUCOSE 165* 116*  BUN 20 23*  CREATININE 1.21* 1.07*  CALCIUM 8.9 9.0   Liver Function Tests No results for input(s): AST, ALT, ALKPHOS, BILITOT, PROT, ALBUMIN in the last 72 hours. No results for input(s): LIPASE, AMYLASE in the last 72 hours. Cardiac Enzymes  Recent Labs  06/23/15 0040 06/23/15 0638  TROPONINI 0.22* 0.62*   Thyroid Function Tests  Recent Labs  06/23/15 0040  TSH 3.076    Dg Chest Port 1 View: 06/22/2015  CLINICAL DATA:  82 year old female with chest pain EXAM: PORTABLE CHEST 1 VIEW COMPARISON:  Chest radiograph dated 05/02/2015  FINDINGS: Single-view of chest does not demonstrate a focal consolidation. There is no pleural effusion or pneumothorax. The cardiac silhouette is within normal limits. No acute osseous pathology identified. A small hiatal hernia noted. Left breast surgical clips. IMPRESSION: No active disease. Electronically Signed   By: Anner Crete M.D.   On: 06/22/2015 19:55    Diagnostic Studies/Procedures     None Performed  Disposition   Pt is being discharged home today in good condition.  Follow-up Plans &  Appointments    Follow-up Information    Follow up with Nahser, Wonda Cheng, MD On 09/23/2015.   Specialty:  Cardiology   Why:  Cardiology Follow-Up on 09/23/2015 at 11:00AM. Please call if needing to be seen before then.   Contact information:   [address] [place] [address] [phone]      Discharge Instructions    Diet - low sodium heart healthy    Complete by:  As directed      Increase activity slowly    Complete by:  As directed            Discharge Medications   Current Discharge Medication List    CONTINUE these medications which have NOT CHANGED   Details  acetaminophen (TYLENOL) 500 MG tablet Take 500 mg by mouth every 4 (four) hours as needed for mild pain.    alum & mag hydroxide-simeth (MINTOX) [id number] MG/5ML suspension Take 30 mLs by mouth every 6 (six) hours as needed for indigestion or heartburn.    aspirin EC 81 MG tablet Take 81 mg by mouth daily.    furosemide (LASIX) 40 MG tablet Take 40 mg by mouth 2 (two) times daily.     guaifenesin (ROBITUSSIN) 100 MG/5ML syrup Take 200 mg by mouth every 6 (six) hours as needed for cough.    KLOR-CON M20 20 MEQ tablet Take 20 mEq by mouth 2 (two) times daily.     loperamide (IMODIUM) 2 MG capsule Take 2 mg by mouth as needed for diarrhea or loose stools.    magnesium hydroxide (MILK OF MAGNESIA) 400 MG/5ML suspension Take 30 mLs by mouth at bedtime as needed for mild constipation.    Neomycin-Bacitracin-Polymyxin (HCA TRIPLE ANTIBIOTIC OINTMENT EX) Apply 1 application topically daily as needed (skintear/abrasions).    pantoprazole (PROTONIX) 40 MG tablet Take 40 mg by mouth daily. Refills: 3    TOPROL XL 50 MG 24 hr tablet Take 50 mg by mouth daily. Name Brand ONLY    ibuprofen (ADVIL,MOTRIN) 200 MG tablet Take 200 mg by mouth every 6 (six) hours as needed (back pain). Reported on 06/22/2015         Allergies Allergies  Allergen Reactions  . Contrast Media [Iodinated Diagnostic  Agents] Other (See Comments)    Cardiac arrest  . Metoprolol Other (See Comments)    Per daughter "causes her heart to race" Takes brand name Toprol XL only  . Pyridium [Phenazopyridine Hcl] Swelling    unknown     Outstanding Labs/Studies   None  Duration of Discharge Encounter   Greater than 30 minutes including physician time.  Signed, Erma Heritage, PA-C 06/23/2015, 12:03 PM

## 2015-06-23 NOTE — Progress Notes (Addendum)
     SUBJECTIVE: She feels much better. She has no chest pain or dyspnea.   Tele: NSR  BP 135/48 mmHg  Pulse 68  Temp(Src) 98.6 F (37 C) (Oral)  Resp 15  Ht 5\' 2"  (1.575 m)  Wt 138 lb 6.4 oz (62.778 kg)  BMI 25.31 kg/m2  SpO2 100%  Intake/Output Summary (Last 24 hours) at 06/23/15 1031 Last data filed at 06/23/15 0850  Gross per 24 hour  Intake    440 ml  Output    950 ml  Net   -510 ml    PHYSICAL EXAM General: Thin elderly female Psych:  Good affect, responds appropriately Neck: No JVD. No masses noted.  Lungs: Clear bilaterally with no wheezes or rhonci noted.  Heart: RRR with no murmurs noted. Abdomen: Bowel sounds are present. Soft, non-tender.  Extremities: Bilateral lower extremity edema with chronic venous stasis changes.   LABS: Basic Metabolic Panel:  Recent Labs  06/22/15 1936 06/23/15 0638  NA 139 139  K 3.3* 3.9  CL 103 106  CO2 26 24  GLUCOSE 165* 116*  BUN 20 23*  CREATININE 1.21* 1.07*  CALCIUM 8.9 9.0   CBC:  Recent Labs  06/22/15 1936 06/23/15 0638  WBC 4.0 6.1  HGB 13.0 12.5  HCT 39.6 37.6  MCV 87.6 87.2  PLT 128* 143*   Cardiac Enzymes:  Recent Labs  06/23/15 0040 06/23/15 0638  TROPONINI 0.22* 0.62*   Current Meds: . aspirin EC  81 mg Oral Daily  . furosemide  40 mg Oral BID  . NON FORMULARY 50 mg  50 mg Mouth/Throat Daily  . pantoprazole  40 mg Oral Daily  . potassium chloride SA  20 mEq Oral BID    ASSESSMENT AND PLAN:  1. Atrial fib/flutter with RVR: She was in atrial flutter last night. She is now in sinus. Digoxin given last night. She has not been given metoprolol as the family refuses generic meds. She is elderly and resides in a facility. She has not been on anti-coagulation for years due to decision by pt and her primary provider.  I think she could be discharged today if I can speak to her family. Would discharge on home dose of Toprol.   2. CHRONIC DIASTOLIC CHF: She seems to be euvolemic. No change  in therapy.  3. CHEST PAIN/elevated troponin:Likely due to elevated heart rate. Not a good candidate for cath. She has no ischemic EKG changes. No exertional chest pain at home. Will continue with conservative therapy if she remains without further pain or any EKG changes as we control her rate.   4. Code status: She is a DNR  Discharge after 5 pm to [facility]. Daughter will come after work today and transport her.     Alexis Winters  3/29/201710:31 AM

## 2015-06-23 NOTE — Progress Notes (Signed)
Brittnay Strader PA-c paged and responded back  re ;elevated troponin level at 0.62. Was at 0.22 prior lab work .

## 2015-06-23 NOTE — Clinical Social Work Note (Signed)
Clinical Social Work Assessment  Patient Details  Name: Alexis Winters MRN: [id number] Date of Birth: 09/06/1927  Date of referral:  06/23/15               Reason for consult:  Discharge Planning, Facility Placement                Permission sought to share information with:  Facility Sport and exercise psychologist, Family Supports Permission granted to share information::  Yes, Verbal Permission Granted  Name::     Special educational needs teacher::  [facility]  Relationship::     Contact Information:     Housing/Transportation Living arrangements for the past 2 months:  [place] [facility] (Memory care) Source of Information:  Adult Children Patient Interpreter Needed:  None Criminal Activity/Legal Involvement Pertinent to Current Situation/Hospitalization:  No - Comment as needed Significant Relationships:  Adult Children Lives with:  Facility Resident Do you feel safe going back to the place where you live?  Yes Need for family participation in patient care:  Yes (Comment)  Care giving concerns:  The patient and daughter plan for return to [facility] memory care at discharge.   Social Worker assessment / plan:  Patient sleeping at time of assessment. CSW spoke with daughter Alexis Winters by phone to complete assessment. Per Alexis Winters, the patient is from [facility] care and plans for the patient to return at discharge. CSW explained CSW role and process of getting the patient back to the facility at discharge. CSW will continue to follow.  Employment status:  Retired Forensic scientist:  Information systems manager, Medicaid [place] PT Recommendations:  Not assessed at this time [place] / Referral to community resources:  Other (Comment Required) (Information will be sent to [facility])  Patient/Family's Response to care:  The patient's daughter shares that she is happy with the care the patient is receiving.   Patient/Family's Understanding of and Emotional Response to Diagnosis,  Current Treatment, and Prognosis:  The patient's daughter appears to have a good understanding of the reason for the patient's admission and the patient's post DC needs.   Emotional Assessment Appearance:  Appears stated age Attitude/Demeanor/Rapport:  Unable to Assess Affect (typically observed):  Unable to Assess Orientation:  Oriented to Self, Oriented to Place, Oriented to  Time Alcohol / Substance use:  Not Applicable Psych involvement (Current and /or in the community):  No (Comment)  Discharge Needs  Concerns to be addressed:  Discharge Planning Concerns Readmission within the last 30 days:  No Current discharge risk:  Chronically ill, Cognitively Impaired Barriers to Discharge:  Continued Medical Work up   Rigoberto Noel, LCSW 06/23/2015, 10:54 AM

## 2015-06-28 DIAGNOSIS — I5032 Chronic diastolic (congestive) heart failure: Secondary | ICD-10-CM | POA: Diagnosis not present

## 2015-06-28 DIAGNOSIS — G301 Alzheimer's disease with late onset: Secondary | ICD-10-CM | POA: Diagnosis not present

## 2015-06-28 DIAGNOSIS — I483 Typical atrial flutter: Secondary | ICD-10-CM | POA: Diagnosis not present

## 2015-06-28 DIAGNOSIS — F0280 Dementia in other diseases classified elsewhere without behavioral disturbance: Secondary | ICD-10-CM | POA: Diagnosis not present

## 2015-07-09 DIAGNOSIS — I5032 Chronic diastolic (congestive) heart failure: Secondary | ICD-10-CM | POA: Diagnosis not present

## 2015-07-09 DIAGNOSIS — F0390 Unspecified dementia without behavioral disturbance: Secondary | ICD-10-CM | POA: Diagnosis not present

## 2015-07-09 DIAGNOSIS — I951 Orthostatic hypotension: Secondary | ICD-10-CM | POA: Diagnosis not present

## 2015-07-09 DIAGNOSIS — Z4800 Encounter for change or removal of nonsurgical wound dressing: Secondary | ICD-10-CM | POA: Diagnosis not present

## 2015-07-09 DIAGNOSIS — F419 Anxiety disorder, unspecified: Secondary | ICD-10-CM | POA: Diagnosis not present

## 2015-07-09 DIAGNOSIS — M1712 Unilateral primary osteoarthritis, left knee: Secondary | ICD-10-CM | POA: Diagnosis not present

## 2015-07-15 DIAGNOSIS — I5032 Chronic diastolic (congestive) heart failure: Secondary | ICD-10-CM | POA: Diagnosis not present

## 2015-07-15 DIAGNOSIS — F0390 Unspecified dementia without behavioral disturbance: Secondary | ICD-10-CM | POA: Diagnosis not present

## 2015-07-15 DIAGNOSIS — Z4800 Encounter for change or removal of nonsurgical wound dressing: Secondary | ICD-10-CM | POA: Diagnosis not present

## 2015-07-15 DIAGNOSIS — M1712 Unilateral primary osteoarthritis, left knee: Secondary | ICD-10-CM | POA: Diagnosis not present

## 2015-07-15 DIAGNOSIS — F419 Anxiety disorder, unspecified: Secondary | ICD-10-CM | POA: Diagnosis not present

## 2015-07-15 DIAGNOSIS — I951 Orthostatic hypotension: Secondary | ICD-10-CM | POA: Diagnosis not present

## 2015-07-16 DIAGNOSIS — M1712 Unilateral primary osteoarthritis, left knee: Secondary | ICD-10-CM | POA: Diagnosis not present

## 2015-07-19 DIAGNOSIS — R600 Localized edema: Secondary | ICD-10-CM | POA: Diagnosis not present

## 2015-07-19 DIAGNOSIS — F0390 Unspecified dementia without behavioral disturbance: Secondary | ICD-10-CM | POA: Diagnosis not present

## 2015-07-19 DIAGNOSIS — I5032 Chronic diastolic (congestive) heart failure: Secondary | ICD-10-CM | POA: Diagnosis not present

## 2015-07-19 DIAGNOSIS — I483 Typical atrial flutter: Secondary | ICD-10-CM | POA: Diagnosis not present

## 2015-07-20 DIAGNOSIS — F0390 Unspecified dementia without behavioral disturbance: Secondary | ICD-10-CM | POA: Diagnosis not present

## 2015-07-20 DIAGNOSIS — F419 Anxiety disorder, unspecified: Secondary | ICD-10-CM | POA: Diagnosis not present

## 2015-07-20 DIAGNOSIS — I5032 Chronic diastolic (congestive) heart failure: Secondary | ICD-10-CM | POA: Diagnosis not present

## 2015-07-20 DIAGNOSIS — Z4800 Encounter for change or removal of nonsurgical wound dressing: Secondary | ICD-10-CM | POA: Diagnosis not present

## 2015-07-20 DIAGNOSIS — M1712 Unilateral primary osteoarthritis, left knee: Secondary | ICD-10-CM | POA: Diagnosis not present

## 2015-07-20 DIAGNOSIS — I951 Orthostatic hypotension: Secondary | ICD-10-CM | POA: Diagnosis not present

## 2015-07-23 DIAGNOSIS — I5032 Chronic diastolic (congestive) heart failure: Secondary | ICD-10-CM | POA: Diagnosis not present

## 2015-07-23 DIAGNOSIS — I951 Orthostatic hypotension: Secondary | ICD-10-CM | POA: Diagnosis not present

## 2015-07-23 DIAGNOSIS — M1712 Unilateral primary osteoarthritis, left knee: Secondary | ICD-10-CM | POA: Diagnosis not present

## 2015-07-23 DIAGNOSIS — Z4800 Encounter for change or removal of nonsurgical wound dressing: Secondary | ICD-10-CM | POA: Diagnosis not present

## 2015-07-23 DIAGNOSIS — F419 Anxiety disorder, unspecified: Secondary | ICD-10-CM | POA: Diagnosis not present

## 2015-07-23 DIAGNOSIS — F0390 Unspecified dementia without behavioral disturbance: Secondary | ICD-10-CM | POA: Diagnosis not present

## 2015-07-26 DIAGNOSIS — F0280 Dementia in other diseases classified elsewhere without behavioral disturbance: Secondary | ICD-10-CM | POA: Diagnosis not present

## 2015-07-26 DIAGNOSIS — I483 Typical atrial flutter: Secondary | ICD-10-CM | POA: Diagnosis not present

## 2015-07-26 DIAGNOSIS — R600 Localized edema: Secondary | ICD-10-CM | POA: Diagnosis not present

## 2015-07-26 DIAGNOSIS — I5032 Chronic diastolic (congestive) heart failure: Secondary | ICD-10-CM | POA: Diagnosis not present

## 2015-07-30 DIAGNOSIS — I5032 Chronic diastolic (congestive) heart failure: Secondary | ICD-10-CM | POA: Diagnosis not present

## 2015-07-30 DIAGNOSIS — I951 Orthostatic hypotension: Secondary | ICD-10-CM | POA: Diagnosis not present

## 2015-07-30 DIAGNOSIS — F0390 Unspecified dementia without behavioral disturbance: Secondary | ICD-10-CM | POA: Diagnosis not present

## 2015-07-30 DIAGNOSIS — M1712 Unilateral primary osteoarthritis, left knee: Secondary | ICD-10-CM | POA: Diagnosis not present

## 2015-07-30 DIAGNOSIS — Z4800 Encounter for change or removal of nonsurgical wound dressing: Secondary | ICD-10-CM | POA: Diagnosis not present

## 2015-07-30 DIAGNOSIS — F419 Anxiety disorder, unspecified: Secondary | ICD-10-CM | POA: Diagnosis not present

## 2015-08-04 DIAGNOSIS — Z4800 Encounter for change or removal of nonsurgical wound dressing: Secondary | ICD-10-CM | POA: Diagnosis not present

## 2015-08-04 DIAGNOSIS — F0390 Unspecified dementia without behavioral disturbance: Secondary | ICD-10-CM | POA: Diagnosis not present

## 2015-08-04 DIAGNOSIS — I951 Orthostatic hypotension: Secondary | ICD-10-CM | POA: Diagnosis not present

## 2015-08-04 DIAGNOSIS — M1712 Unilateral primary osteoarthritis, left knee: Secondary | ICD-10-CM | POA: Diagnosis not present

## 2015-08-04 DIAGNOSIS — F419 Anxiety disorder, unspecified: Secondary | ICD-10-CM | POA: Diagnosis not present

## 2015-08-04 DIAGNOSIS — I5032 Chronic diastolic (congestive) heart failure: Secondary | ICD-10-CM | POA: Diagnosis not present

## 2015-08-12 ENCOUNTER — Emergency Department (HOSPITAL_COMMUNITY): Payer: Medicare Other

## 2015-08-12 ENCOUNTER — Encounter (HOSPITAL_COMMUNITY): Payer: Self-pay | Admitting: Emergency Medicine

## 2015-08-12 ENCOUNTER — Emergency Department (HOSPITAL_COMMUNITY)
Admission: EM | Admit: 2015-08-12 | Discharge: 2015-08-12 | Disposition: A | Discharge status: Home / Self Care | Payer: Medicare Other | Attending: Emergency Medicine | Admitting: Emergency Medicine

## 2015-08-12 DIAGNOSIS — I4892 Unspecified atrial flutter: Secondary | ICD-10-CM | POA: Insufficient documentation

## 2015-08-12 DIAGNOSIS — Z79899 Other long term (current) drug therapy: Secondary | ICD-10-CM | POA: Diagnosis not present

## 2015-08-12 DIAGNOSIS — I5032 Chronic diastolic (congestive) heart failure: Secondary | ICD-10-CM | POA: Insufficient documentation

## 2015-08-12 DIAGNOSIS — Z87440 Personal history of urinary (tract) infections: Secondary | ICD-10-CM | POA: Diagnosis not present

## 2015-08-12 DIAGNOSIS — H9190 Unspecified hearing loss, unspecified ear: Secondary | ICD-10-CM | POA: Insufficient documentation

## 2015-08-12 DIAGNOSIS — Z9889 Other specified postprocedural states: Secondary | ICD-10-CM | POA: Diagnosis not present

## 2015-08-12 DIAGNOSIS — I10 Essential (primary) hypertension: Secondary | ICD-10-CM | POA: Insufficient documentation

## 2015-08-12 DIAGNOSIS — R079 Chest pain, unspecified: Secondary | ICD-10-CM | POA: Diagnosis present

## 2015-08-12 DIAGNOSIS — R600 Localized edema: Secondary | ICD-10-CM | POA: Insufficient documentation

## 2015-08-12 DIAGNOSIS — K219 Gastro-esophageal reflux disease without esophagitis: Secondary | ICD-10-CM | POA: Insufficient documentation

## 2015-08-12 DIAGNOSIS — Z853 Personal history of malignant neoplasm of breast: Secondary | ICD-10-CM | POA: Insufficient documentation

## 2015-08-12 DIAGNOSIS — E876 Hypokalemia: Secondary | ICD-10-CM | POA: Insufficient documentation

## 2015-08-12 DIAGNOSIS — M47896 Other spondylosis, lumbar region: Secondary | ICD-10-CM | POA: Insufficient documentation

## 2015-08-12 DIAGNOSIS — M169 Osteoarthritis of hip, unspecified: Secondary | ICD-10-CM | POA: Diagnosis not present

## 2015-08-12 DIAGNOSIS — Z7982 Long term (current) use of aspirin: Secondary | ICD-10-CM | POA: Diagnosis not present

## 2015-08-12 DIAGNOSIS — R000 Tachycardia, unspecified: Secondary | ICD-10-CM | POA: Diagnosis not present

## 2015-08-12 LAB — CBC WITH DIFFERENTIAL/PLATELET
Basophils Absolute: 0 10*3/uL (ref 0.0–0.1)
Basophils Relative: 0 %
Eosinophils Absolute: 0.1 10*3/uL (ref 0.0–0.7)
Eosinophils Relative: 2 %
HCT: 42.2 % (ref 36.0–46.0)
Hemoglobin: 14 g/dL (ref 12.0–15.0)
Lymphocytes Relative: 23 %
Lymphs Abs: 1.3 10*3/uL (ref 0.7–4.0)
MCH: 28.7 pg (ref 26.0–34.0)
MCHC: 33.2 g/dL (ref 30.0–36.0)
MCV: 86.5 fL (ref 78.0–100.0)
Monocytes Absolute: 0.4 10*3/uL (ref 0.1–1.0)
Monocytes Relative: 7 %
Neutro Abs: 4 10*3/uL (ref 1.7–7.7)
Neutrophils Relative %: 68 %
Platelets: 140 10*3/uL — ABNORMAL LOW (ref 150–400)
RBC: 4.88 MIL/uL (ref 3.87–5.11)
RDW: 12.9 % (ref 11.5–15.5)
WBC: 5.9 10*3/uL (ref 4.0–10.5)

## 2015-08-12 LAB — BASIC METABOLIC PANEL
Anion gap: 11 (ref 5–15)
BUN: 21 mg/dL — ABNORMAL HIGH (ref 6–20)
CO2: 28 mmol/L (ref 22–32)
Calcium: 8.8 mg/dL — ABNORMAL LOW (ref 8.9–10.3)
Chloride: 103 mmol/L (ref 101–111)
Creatinine, Ser: 1.01 mg/dL — ABNORMAL HIGH (ref 0.44–1.00)
GFR calc Af Amer: 57 mL/min — ABNORMAL LOW (ref >60)
GFR calc non Af Amer: 49 mL/min — ABNORMAL LOW (ref >60)
Glucose, Bld: 149 mg/dL — ABNORMAL HIGH (ref 65–99)
Potassium: 3.2 mmol/L — ABNORMAL LOW (ref 3.5–5.1)
Sodium: 142 mmol/L (ref 135–145)

## 2015-08-12 LAB — TROPONIN I: Troponin I: <0.03 ng/mL (ref <0.031)

## 2015-08-12 LAB — MAGNESIUM: Magnesium: 2.2 mg/dL (ref 1.7–2.4)

## 2015-08-12 MED ORDER — DEXTROSE 5 % IV SOLN
5.0000 mg/h | Freq: Once | INTRAVENOUS | Status: AC
  Administered 2015-08-12: 5 mg/h via INTRAVENOUS

## 2015-08-12 MED ORDER — SODIUM CHLORIDE 0.9 % IV SOLN
INTRAVENOUS | Status: DC
Start: 2015-08-12 — End: 2015-08-12
  Administered 2015-08-12: 16:00:00 via INTRAVENOUS

## 2015-08-12 MED ORDER — POTASSIUM CHLORIDE 10 MEQ/100ML IV SOLN
10.0000 meq | Freq: Once | INTRAVENOUS | Status: AC
  Administered 2015-08-12: 10 meq via INTRAVENOUS
  Filled 2015-08-12: qty 100

## 2015-08-12 MED ORDER — DILTIAZEM HCL 25 MG/5ML IV SOLN
15.0000 mg | Freq: Once | INTRAVENOUS | Status: AC
  Administered 2015-08-12: 15 mg via INTRAVENOUS

## 2015-08-12 MED ORDER — DILTIAZEM HCL 100 MG IV SOLR
INTRAVENOUS | Status: AC
  Filled 2015-08-12: qty 100

## 2015-08-12 MED ORDER — POTASSIUM CHLORIDE 20 MEQ/15ML (10%) PO SOLN
20.0000 meq | Freq: Once | ORAL | Status: AC
  Administered 2015-08-12: 20 meq via ORAL
  Filled 2015-08-12: qty 15

## 2015-08-12 NOTE — ED Notes (Signed)
Pt daughter reports pt had mastectomy with lymph node removal on left side and is restricted.  IV placed PTA d/c and new access obtained.

## 2015-08-12 NOTE — Discharge Instructions (Signed)
Atrial Flutter °Atrial flutter is a heart rhythm that can cause the heart to beat very fast (tachycardia). It originates in the upper chambers of the heart (atria). In atrial flutter, the top chambers of the heart (atria) often beat much faster than the bottom chambers of the heart (ventricles). Atrial flutter has a regular "saw toothed" appearance in an EKG readout. An EKG is a test that records the electrical activity of the heart. Atrial flutter can cause the heart to beat up to 150 beats per minute (BPM). Atrial flutter can either be short lived (paroxysmal) or permanent.  °CAUSES  °Causes of atrial flutter can be many. Some of these include: °· Heart related issues: °¨ Heart attack (myocardial infarction). °¨ Heart failure. °¨ Heart valve problems. °¨ Poorly controlled high blood pressure (hypertension). °¨ After open heart surgery. °· Lung related issues: °¨ A blood clot in the lungs (pulmonary embolism). °¨ Chronic obstructive pulmonary disease (COPD). Medications used to treat COPD can attribute to atrial flutter. °· Other related causes: °¨ Hyperthyroidism. °¨ Caffeine. °¨ Some decongestant cold medications. °¨ Low electrolyte levels such as potassium or magnesium. °¨ Cocaine. °SYMPTOMS °· An awareness of your heart beating rapidly (palpitations). °· Shortness of breath. °· Chest pain. °· Low blood pressure (hypotension). °· Dizziness or fainting. °DIAGNOSIS  °Different tests can be performed to diagnose atrial flutter.  °· An EKG. °· Holter monitor. This is a 24-hour recording of your heart rhythm. You will also be given a diary. Write down all symptoms that you have and what you were doing at the time you experienced symptoms. °· Cardiac event monitor. This small device can be worn for up to 30 days. When you have heart symptoms, you will push a button on the device. This will then record your heart rhythm. °· Echocardiogram. This is an imaging test to look at your heart. Your caregiver will look at your  heart valves and the ventricles. °· Stress test. This test can help determine if the atrial flutter is related to exercise or if coronary artery disease is present. °· Laboratory studies will look at certain blood levels like: °¨ Complete blood count (CBC). °¨ Potassium. °¨ Magnesium. °¨ Thyroid function. °TREATMENT  °Treatment of atrial flutter varies. A combination of therapies may be used or sometimes atrial flutter may need only 1 type of treatment.  °Lab work: °If your blood work, such as your electrolytes (potassium, magnesium) or your thyroid function tests, are abnormal, your caregiver will treat them accordingly.  °Medication:  °There are several different types of medications that can convert your heart to a normal rhythm and prevent atrial flutter from reoccurring.  °Nonsurgical procedures: °Nonsurgical techniques may be used to control atrial flutter. Some examples include: °· Cardioversion. This technique uses either drugs or an electrical shock to restore a normal heart rhythm: °¨ Cardioversion drugs may be given through an intravenous (IV) line to help "reset" the heart rhythm. °¨ In electrical cardioversion, your caregiver shocks your heart with electrical energy. This helps to reset the heartbeat to a normal rhythm. °· Ablation. If atrial flutter is a persistent problem, an ablation may be needed. This procedure is done under mild sedation. High frequency radio-wave energy is used to destroy the area of heart tissue responsible for atrial flutter. °SEEK IMMEDIATE MEDICAL CARE IF:  °You have: °· Dizziness. °· Near fainting or fainting. °· Shortness of breath. °· Chest pain or pressure. °· Sudden nausea or vomiting. °· Profuse sweating. °If you have the above symptoms,   call your local emergency service immediately! Do not drive yourself to the hospital. °MAKE SURE YOU:  °· Understand these instructions. °· Will watch your condition. °· Will get help right away if you are not doing well or get worse. °   °This information is not intended to replace advice given to you by your health care provider. Make sure you discuss any questions you have with your health care provider. °  °Document Released: 07/30/2008 Document Revised: 04/03/2014 Document Reviewed: 09/25/2014 °Elsevier Interactive Patient Education ©2016 [employer] ° °

## 2015-08-12 NOTE — ED Notes (Signed)
Pt arrives from [facility] via [place];  EMS reports pt reported L sided CP, radiating to L arm, unable to describe pain.  EMS reports hx dementia.  EMS reports initial HR 170, reports giving 6 followed by 12 of adenosine and 324 ASA.  Pt's daughter reports hx afib. NAD noted at this time, resp e/u.  Dr. Wilson Singer at bedside.

## 2015-08-12 NOTE — ED Provider Notes (Signed)
CSN: [id number]     Arrival date & time 08/12/15  1530 History   First MD Initiated Contact with Patient 08/12/15 1538     Chief Complaint  Patient presents with  . Chest Pain     (Consider location/radiation/quality/duration/timing/severity/associated sxs/prior Treatment) HPI   82 show female with chest pain and atrial fibrillation with a rapid ventricular rate. Patient herself is not a very good historian. Symptoms started sometime earlier today but exact timing is not clear. Reports pain in left side of her chest into her left neck/shoulder but cannot describe much further than this. She is brought in by EMS. They noted a narrow complex tachycardia with a rate in 160s to 170s. She was given 6 mg and then 12 mg of adenosine with no apparent affect. She is also given 324 mg of aspirin. She does have a past history of A. fib. Per review of records, it sounds like she presented with similar symptoms when in rapid A. Fib. Previously decided not to be good candidate for anticoagulation despite her age and other risk factors. She is currently on Toprol-XL. Daughter is adamant that she does not receive "generic" metoprolol.   Past Medical History  Diagnosis Date  . Chronic diastolic CHF (congestive heart failure) ([place])     a. echo 09/12/13 EF Q000111Q, grade 1 diastolic dysf, mild LVOT obstr, moderate MR, elevated filling pressure  . Hypoglycemia   . Hard of hearing   . Phlebitis     l leg  . Hypertension   . Frequent UTI   . Enlarged liver   . Hemorrhoids   . Dyslipidemia   . OA (osteoarthritis)     hip and back  . Atrial fibrillation ([place])     a. h/o PAF, in NSR during admission 09/11/13  . Allergic rhinitis   . GERD (gastroesophageal reflux disease)   . Prediabetes   . Arrhythmia     a. office to call pt on 09/15/13 for 30 day event monitor  . Breast cancer ([place])   . Mitral regurgitation     a. moderate MR by echo 09/12/2013   Past Surgical History  Procedure Laterality Date  .  Abdominal hysterectomy    . Bladder suspension    . Breast lumpectomy    . Foot surgery      left foot  . Inner ear surgery    . Cardiac catheterization  60's    [facility]   Family History  Problem Relation Age of Onset  . Cerebral palsy Sister   . Alzheimer's disease Brother   . Hypertension Father   . Heart disease Brother    Social History  Substance Use Topics  . Smoking status: Former Research scientist (life sciences)  . Smokeless tobacco: Never Used  . Alcohol Use: No   OB History    No data available     Review of Systems  All systems reviewed and negative, other than as noted in HPI.   Allergies  Contrast media; Metoprolol; and Pyridium  Home Medications   Prior to Admission medications   Medication Sig Start Date End Date Taking? Authorizing Provider  acetaminophen (TYLENOL) 500 MG tablet Take 500 mg by mouth every 4 (four) hours as needed for mild pain.   Yes Historical Provider, MD  alum & mag hydroxide-simeth (MINTOX) 200-200-20 MG/5ML suspension Take 30 mLs by mouth every 6 (six) hours as needed for indigestion or heartburn.   Yes Historical Provider, MD  aspirin EC 81 MG tablet Take 81 mg by  mouth daily.   Yes Historical Provider, MD  furosemide (LASIX) 40 MG tablet Take 40 mg by mouth 2 (two) times daily.  04/09/12  Yes Historical Provider, MD  guaifenesin (ROBITUSSIN) 100 MG/5ML syrup Take 200 mg by mouth every 6 (six) hours as needed for cough.   Yes Historical Provider, MD  ibuprofen (ADVIL,MOTRIN) 200 MG tablet Take 200 mg by mouth every 6 (six) hours as needed (back pain). Reported on 06/22/2015   Yes Historical Provider, MD  KLOR-CON M20 20 MEQ tablet Take 20 mEq by mouth 2 (two) times daily.  03/23/12  Yes Historical Provider, MD  loperamide (IMODIUM) 2 MG capsule Take 2 mg by mouth as needed for diarrhea or loose stools.   Yes Historical Provider, MD  magnesium hydroxide (MILK OF MAGNESIA) 400 MG/5ML suspension Take 30 mLs by mouth at bedtime as needed for mild constipation.   Yes  Historical Provider, MD  Neomycin-Bacitracin-Polymyxin (HCA TRIPLE ANTIBIOTIC OINTMENT EX) Apply 1 application topically daily as needed (skintear/abrasions).   Yes Historical Provider, MD  pantoprazole (PROTONIX) 40 MG tablet Take 40 mg by mouth daily. 10/19/14  Yes Historical Provider, MD  TOPROL XL 50 MG 24 hr tablet Take 50 mg by mouth daily. Name Brand ONLY 01/29/12  Yes Historical Provider, MD   BP 115/64 mmHg  Pulse 62  Temp(Src) 97.3 F (36.3 C) (Oral)  Resp 17  SpO2 95% Physical Exam  Constitutional: She appears well-developed and well-nourished.  Sitting up in bed. Appears mildly uncomfortable but not distressed.  HENT:  Head: Normocephalic and atraumatic.  Eyes: Conjunctivae are normal. Right eye exhibits no discharge. Left eye exhibits no discharge.  Neck: Neck supple.  Cardiovascular: Regular rhythm and normal heart sounds.  Exam reveals no gallop and no friction rub.   No murmur heard. Tachycardic. Irregularly irregular.  Pulmonary/Chest: Effort normal and breath sounds normal. No respiratory distress.  Abdominal: Soft. She exhibits no distension. There is no tenderness.  Musculoskeletal: She exhibits edema. She exhibits no tenderness.  Symmetric lower extremity edema. No calf tenderness.  Neurological: She is alert.  Skin: Skin is warm and dry.  Nursing note and vitals reviewed.   ED Course  Procedures (including critical care time) Labs Review Labs Reviewed  CBC WITH DIFFERENTIAL/PLATELET - Abnormal; Notable for the following:    Platelets 140 (*)    All other components within normal limits  BASIC METABOLIC PANEL - Abnormal; Notable for the following:    Potassium 3.2 (*)    Glucose, Bld 149 (*)    BUN 21 (*)    Creatinine, Ser 1.01 (*)    Calcium 8.8 (*)    GFR calc non Af Amer 49 (*)    GFR calc Af Amer 57 (*)    All other components within normal limits  TROPONIN I  MAGNESIUM    Imaging Review Dg Chest Portable 1 View  08/12/2015  CLINICAL DATA:   Chest pain EXAM: PORTABLE CHEST 1 VIEW COMPARISON:  06/22/2015 FINDINGS: Cardiomediastinal silhouette is stable. No acute infiltrate or pleural effusion. No pulmonary edema. Surgical clips are noted left breast. IMPRESSION: No active disease. Electronically Signed   By: Lahoma Crocker M.D.   On: 08/12/2015 16:28   I have personally reviewed and evaluated these images and lab results as part of my medical decision-making.   EKG Interpretation   Date/Time:  Thursday Aug 12 2015 15:55:46 EDT Ventricular Rate:  78 PR Interval:  299 QRS Duration: 104 QT Interval:  381 QTC Calculation: 434 R Axis:  20 Text Interpretation:  Sinus rhythm Prolonged PR interval Abnormal R-wave  progression, early transition Borderline ST depression, anterolateral  leads Confirmed by Wilson Singer  MD, Jrue Jarriel ([id number]) on 08/12/2015 5:49:12 PM      MDM   Final diagnoses:  Atrial flutter, unspecified type ([place])    82 year old female who presented with a pretty regular narrow complex tachycardia with rate in the 170s. Given her past history this is most likely atrial flutter. Adenosine by EMS with no apparent effect.   She actually briefly spontaneously converted to sinus rhythm into the 80s while I was in the room. Her symptoms improved with this. She went back into a flutter with a rate in the 170s to 180s and had recurrence of symptoms.   Her blood pressure was fine. She was given a dose of Cardizem. She again converted before the push was even completed. Her symptoms again resolved. With her dementia it's hard to get a good sense if what exactly she is experiencing is pain/palpitations/dyspnea. Regardless, there was noticeable change in her affect between when she had an accelerated ventricular rate and when she was in sinus rhythm.  Mild hypokalemia noted. Given supplementation. Mag normal. Troponin normal. CXR w/o acute abnormality. After conversion she was observed for over another hour. She had no recurrence of  dysrhythmia or other acute complaints.  She is on 50 mg of Toprol-XL. I'm not overly compelled to adjust her medications at this time. I think she can appropriately follow-up with cardiology and this can be addressed if they feel indicated. Not anticoagulated as previously mentioned. At this time I feel she is appropriate for discharge.  Virgel Manifold, MD 08/20/15 [phone]

## 2015-08-16 DIAGNOSIS — I5032 Chronic diastolic (congestive) heart failure: Secondary | ICD-10-CM | POA: Diagnosis not present

## 2015-08-16 DIAGNOSIS — I483 Typical atrial flutter: Secondary | ICD-10-CM | POA: Diagnosis not present

## 2015-08-16 DIAGNOSIS — F0390 Unspecified dementia without behavioral disturbance: Secondary | ICD-10-CM | POA: Diagnosis not present

## 2015-09-11 ENCOUNTER — Other Ambulatory Visit: Payer: Self-pay

## 2015-09-11 ENCOUNTER — Emergency Department (HOSPITAL_COMMUNITY)
Admission: EM | Admit: 2015-09-11 | Discharge: 2015-09-11 | Disposition: A | Discharge status: Home / Self Care | Payer: Medicare Other | Attending: Emergency Medicine | Admitting: Emergency Medicine

## 2015-09-11 ENCOUNTER — Encounter (HOSPITAL_COMMUNITY): Payer: Self-pay

## 2015-09-11 DIAGNOSIS — R079 Chest pain, unspecified: Secondary | ICD-10-CM | POA: Diagnosis not present

## 2015-09-11 DIAGNOSIS — Z7982 Long term (current) use of aspirin: Secondary | ICD-10-CM | POA: Diagnosis not present

## 2015-09-11 DIAGNOSIS — I110 Hypertensive heart disease with heart failure: Secondary | ICD-10-CM | POA: Diagnosis not present

## 2015-09-11 DIAGNOSIS — R002 Palpitations: Secondary | ICD-10-CM | POA: Diagnosis present

## 2015-09-11 DIAGNOSIS — Z87891 Personal history of nicotine dependence: Secondary | ICD-10-CM | POA: Insufficient documentation

## 2015-09-11 DIAGNOSIS — I4892 Unspecified atrial flutter: Secondary | ICD-10-CM | POA: Diagnosis not present

## 2015-09-11 DIAGNOSIS — I5032 Chronic diastolic (congestive) heart failure: Secondary | ICD-10-CM | POA: Insufficient documentation

## 2015-09-11 DIAGNOSIS — Z853 Personal history of malignant neoplasm of breast: Secondary | ICD-10-CM | POA: Diagnosis not present

## 2015-09-11 LAB — I-STAT CHEM 8, ED
BUN: 23 mg/dL — AB (ref 6–20)
Calcium, Ion: 1.05 mmol/L — ABNORMAL LOW (ref 1.13–1.30)
Chloride: 100 mmol/L — ABNORMAL LOW (ref 101–111)
Creatinine, Ser: 0.9 mg/dL (ref 0.44–1.00)
Glucose, Bld: 129 mg/dL — ABNORMAL HIGH (ref 65–99)
HEMATOCRIT: 43 % (ref 36.0–46.0)
Hemoglobin: 14.6 g/dL (ref 12.0–15.0)
Potassium: 3.2 mmol/L — ABNORMAL LOW (ref 3.5–5.1)
SODIUM: 141 mmol/L (ref 135–145)
TCO2: 28 mmol/L (ref 0–100)

## 2015-09-11 LAB — I-STAT TROPONIN, ED: Troponin i, poc: 0 ng/mL (ref 0.00–0.08)

## 2015-09-11 MED ORDER — POTASSIUM CHLORIDE ER 10 MEQ PO TBCR: 40.0000 meq | EXTENDED_RELEASE_TABLET | Freq: Every day | ORAL | Status: DC

## 2015-09-11 NOTE — ED Provider Notes (Signed)
CSN: [id number]     Arrival date & time 09/11/15  1751 History   First MD Initiated Contact with Patient 09/11/15 1824     Chief Complaint  Patient presents with  . Chest Pain     (Consider location/radiation/quality/duration/timing/severity/associated sxs/prior Treatment) Patient is a 82 y.o. female presenting with palpitations. The history is provided by a relative.  Palpitations Palpitations quality:  Fast Onset quality:  Sudden Timing:  Intermittent Progression:  Resolved Chronicity:  Recurrent Context: not stimulant use   Relieved by:  Nothing Worsened by:  Nothing Ineffective treatments:  None tried Associated symptoms: chest pain (resolved)   Risk factors: hx of atrial fibrillation     Past Medical History  Diagnosis Date  . Chronic diastolic CHF (congestive heart failure) ([place])     a. echo 09/12/13 EF Q000111Q, grade 1 diastolic dysf, mild LVOT obstr, moderate MR, elevated filling pressure  . Hypoglycemia   . Hard of hearing   . Phlebitis     l leg  . Hypertension   . Frequent UTI   . Enlarged liver   . Hemorrhoids   . Dyslipidemia   . OA (osteoarthritis)     hip and back  . Atrial fibrillation ([place])     a. h/o PAF, in NSR during admission 09/11/13  . Allergic rhinitis   . GERD (gastroesophageal reflux disease)   . Prediabetes   . Arrhythmia     a. office to call pt on 09/15/13 for 30 day event monitor  . Breast cancer ([place])   . Mitral regurgitation     a. moderate MR by echo 09/12/2013   Past Surgical History  Procedure Laterality Date  . Abdominal hysterectomy    . Bladder suspension    . Breast lumpectomy    . Foot surgery      left foot  . Inner ear surgery    . Cardiac catheterization  53's    [facility]   Family History  Problem Relation Age of Onset  . Cerebral palsy Sister   . Alzheimer's disease Brother   . Hypertension Father   . Heart disease Brother    Social History  Substance Use Topics  . Smoking status: Former Research scientist (life sciences)  . Smokeless  tobacco: Never Used  . Alcohol Use: No   OB History    No data available     Review of Systems  Cardiovascular: Positive for chest pain (resolved) and palpitations.  All other systems reviewed and are negative.     Allergies  Contrast media; Metoprolol; and Pyridium  Home Medications   Prior to Admission medications   Medication Sig Start Date End Date Taking? Authorizing Provider  acetaminophen (TYLENOL) 500 MG tablet Take 500 mg by mouth every 4 (four) hours as needed for mild pain.    Historical Provider, MD  alum & mag hydroxide-simeth ([place]) 200-200-20 MG/5ML suspension Take 30 mLs by mouth every 6 (six) hours as needed for indigestion or heartburn.    Historical Provider, MD  aspirin EC 81 MG tablet Take 81 mg by mouth daily.    Historical Provider, MD  furosemide (LASIX) 40 MG tablet Take 40 mg by mouth 2 (two) times daily.  04/09/12   Historical Provider, MD  guaifenesin (ROBITUSSIN) 100 MG/5ML syrup Take 200 mg by mouth every 6 (six) hours as needed for cough.    Historical Provider, MD  ibuprofen (ADVIL,MOTRIN) 200 MG tablet Take 200 mg by mouth every 6 (six) hours as needed (back pain). Reported on 06/22/2015  Historical Provider, MD  KLOR-CON M20 20 MEQ tablet Take 20 mEq by mouth 2 (two) times daily.  03/23/12   Historical Provider, MD  loperamide (IMODIUM) 2 MG capsule Take 2 mg by mouth as needed for diarrhea or loose stools.    Historical Provider, MD  magnesium hydroxide (MILK OF MAGNESIA) 400 MG/5ML suspension Take 30 mLs by mouth at bedtime as needed for mild constipation.    Historical Provider, MD  Neomycin-Bacitracin-Polymyxin (HCA TRIPLE ANTIBIOTIC OINTMENT EX) Apply 1 application topically daily as needed (skintear/abrasions).    Historical Provider, MD  pantoprazole (PROTONIX) 40 MG tablet Take 40 mg by mouth daily. 10/19/14   Historical Provider, MD  TOPROL XL 50 MG 24 hr tablet Take 50 mg by mouth daily. Name Brand ONLY 01/29/12   Historical Provider, MD    BP 167/64 mmHg  Pulse 64  Temp(Src) 98.2 F (36.8 C) (Oral)  Resp 16  SpO2 100% Physical Exam  Constitutional: She appears well-developed and well-nourished. No distress.  HENT:  Head: Normocephalic.  Eyes: Conjunctivae are normal.  Neck: Neck supple. No tracheal deviation present.  Cardiovascular: Normal rate, regular rhythm and normal heart sounds.   Pulmonary/Chest: Effort normal and breath sounds normal. No respiratory distress.  Abdominal: Soft. She exhibits no distension. There is no tenderness.  Neurological: She is alert. She is disoriented (at baseline per daughter at bedside).  Skin: Skin is warm and dry.  Psychiatric: She has a normal mood and affect.  Vitals reviewed.   ED Course  Procedures (including critical care time) Labs Review Labs Reviewed  I-STAT CHEM 8, ED - Abnormal; Notable for the following:    Potassium 3.2 (*)    Chloride 100 (*)    BUN 23 (*)    Glucose, Bld 129 (*)    Calcium, Ion 1.05 (*)    All other components within normal limits  I-STAT TROPOININ, ED    Imaging Review No results found. I have personally reviewed and evaluated these images and lab results as part of my medical decision-making.  ED ECG REPORT   Date: 09/12/2015  Rate: 64  Rhythm: normal sinus rhythm  QRS Axis: normal  Intervals: normal  ST/T Wave abnormalities: normal  Conduction Disutrbances:none  Narrative Interpretation:   Old EKG Reviewed: unchanged  I have personally reviewed the EKG tracing and agree with the computerized printout as noted.   MDM   Final diagnoses:  Atrial flutter, paroxysmal ([place])   82 y.o. female presents with chest pain and feeling unwell at facility today. Has history of paroxysmal flutter. Per EMS report she spontaneously converted to sinus after IV placement with no interventions. Screening electrolytes and troponin show no abnormalities except some persistent mild hypokalemia, recommended extra repletion at home and f/u with  cardiology. Plan to follow up with PCP as needed and return precautions discussed for worsening or new concerning symptoms.     Leo Grosser, MD 09/12/15 [phone]

## 2015-09-11 NOTE — Discharge Instructions (Signed)
Atrial Flutter °Atrial flutter is a heart rhythm that can cause the heart to beat very fast (tachycardia). It originates in the upper chambers of the heart (atria). In atrial flutter, the top chambers of the heart (atria) often beat much faster than the bottom chambers of the heart (ventricles). Atrial flutter has a regular "saw toothed" appearance in an EKG readout. An EKG is a test that records the electrical activity of the heart. Atrial flutter can cause the heart to beat up to 150 beats per minute (BPM). Atrial flutter can either be short lived (paroxysmal) or permanent.  °CAUSES  °Causes of atrial flutter can be many. Some of these include: °· Heart related issues: °¨ Heart attack (myocardial infarction). °¨ Heart failure. °¨ Heart valve problems. °¨ Poorly controlled high blood pressure (hypertension). °¨ After open heart surgery. °· Lung related issues: °¨ A blood clot in the lungs (pulmonary embolism). °¨ Chronic obstructive pulmonary disease (COPD). Medications used to treat COPD can attribute to atrial flutter. °· Other related causes: °¨ Hyperthyroidism. °¨ Caffeine. °¨ Some decongestant cold medications. °¨ Low electrolyte levels such as potassium or magnesium. °¨ Cocaine. °SYMPTOMS °· An awareness of your heart beating rapidly (palpitations). °· Shortness of breath. °· Chest pain. °· Low blood pressure (hypotension). °· Dizziness or fainting. °DIAGNOSIS  °Different tests can be performed to diagnose atrial flutter.  °· An EKG. °· Holter monitor. This is a 24-hour recording of your heart rhythm. You will also be given a diary. Write down all symptoms that you have and what you were doing at the time you experienced symptoms. °· Cardiac event monitor. This small device can be worn for up to 30 days. When you have heart symptoms, you will push a button on the device. This will then record your heart rhythm. °· Echocardiogram. This is an imaging test to look at your heart. Your caregiver will look at your  heart valves and the ventricles. °· Stress test. This test can help determine if the atrial flutter is related to exercise or if coronary artery disease is present. °· Laboratory studies will look at certain blood levels like: °¨ Complete blood count (CBC). °¨ Potassium. °¨ Magnesium. °¨ Thyroid function. °TREATMENT  °Treatment of atrial flutter varies. A combination of therapies may be used or sometimes atrial flutter may need only 1 type of treatment.  °Lab work: °If your blood work, such as your electrolytes (potassium, magnesium) or your thyroid function tests, are abnormal, your caregiver will treat them accordingly.  °Medication:  °There are several different types of medications that can convert your heart to a normal rhythm and prevent atrial flutter from reoccurring.  °Nonsurgical procedures: °Nonsurgical techniques may be used to control atrial flutter. Some examples include: °· Cardioversion. This technique uses either drugs or an electrical shock to restore a normal heart rhythm: °¨ Cardioversion drugs may be given through an intravenous (IV) line to help "reset" the heart rhythm. °¨ In electrical cardioversion, your caregiver shocks your heart with electrical energy. This helps to reset the heartbeat to a normal rhythm. °· Ablation. If atrial flutter is a persistent problem, an ablation may be needed. This procedure is done under mild sedation. High frequency radio-wave energy is used to destroy the area of heart tissue responsible for atrial flutter. °SEEK IMMEDIATE MEDICAL CARE IF:  °You have: °· Dizziness. °· Near fainting or fainting. °· Shortness of breath. °· Chest pain or pressure. °· Sudden nausea or vomiting. °· Profuse sweating. °If you have the above symptoms,   call your local emergency service immediately! Do not drive yourself to the hospital. °MAKE SURE YOU:  °· Understand these instructions. °· Will watch your condition. °· Will get help right away if you are not doing well or get worse. °   °This information is not intended to replace advice given to you by your health care provider. Make sure you discuss any questions you have with your health care provider. °  °Document Released: 07/30/2008 Document Revised: 04/03/2014 Document Reviewed: 09/25/2014 °Elsevier Interactive Patient Education ©2016 [employer] ° °

## 2015-09-11 NOTE — ED Notes (Signed)
To room via EMS.  EMS arrived to find pt sitting by nurses station, c/o left chest pain, dizziness, nausea, skin pale.  EKG showed A fib. Pt converted as soon as EMS inserted IV.   No c/o left chest pain, dizziness, nausea since.

## 2015-09-20 DIAGNOSIS — I5032 Chronic diastolic (congestive) heart failure: Secondary | ICD-10-CM | POA: Diagnosis not present

## 2015-09-20 DIAGNOSIS — G309 Alzheimer's disease, unspecified: Secondary | ICD-10-CM | POA: Diagnosis not present

## 2015-09-20 DIAGNOSIS — I483 Typical atrial flutter: Secondary | ICD-10-CM | POA: Diagnosis not present

## 2015-09-23 ENCOUNTER — Ambulatory Visit: Payer: Medicare Other | Admitting: Cardiovascular Disease

## 2015-09-23 ENCOUNTER — Ambulatory Visit: Payer: Medicare Other | Admitting: Cardiology

## 2015-09-27 ENCOUNTER — Emergency Department (HOSPITAL_COMMUNITY): Payer: Medicare Other

## 2015-09-27 ENCOUNTER — Encounter (HOSPITAL_COMMUNITY): Payer: Self-pay | Admitting: Emergency Medicine

## 2015-09-27 ENCOUNTER — Emergency Department (HOSPITAL_COMMUNITY)
Admission: EM | Admit: 2015-09-27 | Discharge: 2015-09-27 | Disposition: A | Discharge status: Home / Self Care | Payer: Medicare Other | Attending: Emergency Medicine | Admitting: Emergency Medicine

## 2015-09-27 DIAGNOSIS — R1012 Left upper quadrant pain: Secondary | ICD-10-CM | POA: Diagnosis not present

## 2015-09-27 DIAGNOSIS — Z79899 Other long term (current) drug therapy: Secondary | ICD-10-CM | POA: Insufficient documentation

## 2015-09-27 DIAGNOSIS — I5032 Chronic diastolic (congestive) heart failure: Secondary | ICD-10-CM | POA: Diagnosis not present

## 2015-09-27 DIAGNOSIS — K2970 Gastritis, unspecified, without bleeding: Secondary | ICD-10-CM | POA: Diagnosis not present

## 2015-09-27 DIAGNOSIS — I110 Hypertensive heart disease with heart failure: Secondary | ICD-10-CM | POA: Diagnosis not present

## 2015-09-27 DIAGNOSIS — K449 Diaphragmatic hernia without obstruction or gangrene: Secondary | ICD-10-CM | POA: Diagnosis not present

## 2015-09-27 DIAGNOSIS — Z853 Personal history of malignant neoplasm of breast: Secondary | ICD-10-CM | POA: Diagnosis not present

## 2015-09-27 DIAGNOSIS — R079 Chest pain, unspecified: Secondary | ICD-10-CM | POA: Diagnosis not present

## 2015-09-27 DIAGNOSIS — Z7982 Long term (current) use of aspirin: Secondary | ICD-10-CM | POA: Diagnosis not present

## 2015-09-27 DIAGNOSIS — R1010 Upper abdominal pain, unspecified: Secondary | ICD-10-CM | POA: Diagnosis present

## 2015-09-27 DIAGNOSIS — R0789 Other chest pain: Secondary | ICD-10-CM | POA: Diagnosis not present

## 2015-09-27 DIAGNOSIS — K5790 Diverticulosis of intestine, part unspecified, without perforation or abscess without bleeding: Secondary | ICD-10-CM | POA: Diagnosis not present

## 2015-09-27 DIAGNOSIS — Z87891 Personal history of nicotine dependence: Secondary | ICD-10-CM | POA: Insufficient documentation

## 2015-09-27 LAB — BASIC METABOLIC PANEL
ANION GAP: 7 (ref 5–15)
BUN: 19 mg/dL (ref 6–20)
CALCIUM: 9.4 mg/dL (ref 8.9–10.3)
CO2: 29 mmol/L (ref 22–32)
CREATININE: 1 mg/dL (ref 0.44–1.00)
Chloride: 108 mmol/L (ref 101–111)
GFR, EST AFRICAN AMERICAN: 57 mL/min — AB (ref >60)
GFR, EST NON AFRICAN AMERICAN: 50 mL/min — AB (ref >60)
GLUCOSE: 102 mg/dL — AB (ref 65–99)
Potassium: 4.1 mmol/L (ref 3.5–5.1)
Sodium: 144 mmol/L (ref 135–145)

## 2015-09-27 LAB — I-STAT CG4 LACTIC ACID, ED: LACTIC ACID, VENOUS: 1.91 mmol/L — AB (ref 0.5–1.9)

## 2015-09-27 LAB — CBC WITH DIFFERENTIAL/PLATELET
BASOS ABS: 0 10*3/uL (ref 0.0–0.1)
BASOS PCT: 1 %
EOS ABS: 0.1 10*3/uL (ref 0.0–0.7)
Eosinophils Relative: 2 %
HCT: 44.1 % (ref 36.0–46.0)
Hemoglobin: 14.7 g/dL (ref 12.0–15.0)
Lymphocytes Relative: 21 %
Lymphs Abs: 1.4 10*3/uL (ref 0.7–4.0)
MCH: 29.9 pg (ref 26.0–34.0)
MCHC: 33.3 g/dL (ref 30.0–36.0)
MCV: 89.6 fL (ref 78.0–100.0)
MONO ABS: 0.5 10*3/uL (ref 0.1–1.0)
MONOS PCT: 7 %
NEUTROS ABS: 4.5 10*3/uL (ref 1.7–7.7)
NEUTROS PCT: 69 %
Platelets: 168 10*3/uL (ref 150–400)
RBC: 4.92 MIL/uL (ref 3.87–5.11)
RDW: 13 % (ref 11.5–15.5)
WBC: 6.5 10*3/uL (ref 4.0–10.5)

## 2015-09-27 LAB — URINALYSIS, ROUTINE W REFLEX MICROSCOPIC
Bilirubin Urine: NEGATIVE
Glucose, UA: NEGATIVE mg/dL
Hgb urine dipstick: NEGATIVE
Ketones, ur: NEGATIVE mg/dL
Nitrite: NEGATIVE
PROTEIN: NEGATIVE mg/dL
SPECIFIC GRAVITY, URINE: 1.015 (ref 1.005–1.030)
pH: 7 (ref 5.0–8.0)

## 2015-09-27 LAB — I-STAT TROPONIN, ED: TROPONIN I, POC: 0 ng/mL (ref 0.00–0.08)

## 2015-09-27 LAB — HEPATIC FUNCTION PANEL
ALBUMIN: 3.6 g/dL (ref 3.5–5.0)
ALT: 20 U/L (ref 14–54)
AST: 30 U/L (ref 15–41)
Alkaline Phosphatase: 112 U/L (ref 38–126)
Bilirubin, Direct: 0.2 mg/dL (ref 0.1–0.5)
Indirect Bilirubin: 0.9 mg/dL (ref 0.3–0.9)
Total Bilirubin: 1.1 mg/dL (ref 0.3–1.2)
Total Protein: 6.6 g/dL (ref 6.5–8.1)

## 2015-09-27 LAB — URINE MICROSCOPIC-ADD ON

## 2015-09-27 LAB — LIPASE, BLOOD: Lipase: 25 U/L (ref 11–51)

## 2015-09-27 MED ORDER — FAMOTIDINE IN NACL 20-0.9 MG/50ML-% IV SOLN
20.0000 mg | INTRAVENOUS | Status: DC
  Filled 2015-09-27: qty 50

## 2015-09-27 MED ORDER — TRAMADOL HCL 50 MG PO TABS: 50.0000 mg | ORAL_TABLET | Freq: Four times a day (QID) | ORAL | Status: DC | PRN

## 2015-09-27 MED ORDER — FAMOTIDINE 20 MG PO TABS: 20.0000 mg | ORAL_TABLET | Freq: Two times a day (BID) | ORAL | Status: DC

## 2015-09-27 MED ORDER — SODIUM CHLORIDE 0.9 % IV BOLUS (SEPSIS): 500.0000 mL | Freq: Once | INTRAVENOUS | Status: DC

## 2015-09-27 NOTE — ED Notes (Signed)
IV attempted x's 2 without success 

## 2015-09-27 NOTE — ED Notes (Signed)
Patient here with left side pain for several days, denies trauma. Patient from memory care unit at [facility]. Tender to touch, no obvious bruising noted

## 2015-09-27 NOTE — ED Notes (Signed)
Pt resting quietly at this time.  Denies pain or discomfort

## 2015-09-27 NOTE — Discharge Instructions (Signed)
Read the information below.  Use the prescribed medication as directed.  Please discuss all new medications with your pharmacist.  You may return to the Emergency Department at any time for worsening condition or any new symptoms that concern you.    If you develop worsening chest pain, shortness of breath, fever, you pass out, or become weak or dizzy, return to the ER for a recheck.     You have been diagnosed by your caregiver as having chest wall pain. SEEK IMMEDIATE MEDICAL ATTENTION IF: You develop a fever.  Your chest pains become severe or intolerable.  You develop new, unexplained symptoms (problems).  You develop shortness of breath, nausea, vomiting, sweating or feel light headed.  You develop a new cough or you cough up blood.   Chest Wall Pain Chest wall pain is pain in or around the bones and muscles of your chest. Sometimes, an injury causes this pain. Sometimes, the cause may not be known. This pain may take several weeks or longer to get better. HOME CARE INSTRUCTIONS  Pay attention to any changes in your symptoms. Take these actions to help with your pain:   Rest as told by your health care provider.   Avoid activities that cause pain. These include any activities that use your chest muscles or your abdominal and side muscles to lift heavy items.   If directed, apply ice to the painful area:  Put ice in a plastic bag.  Place a towel between your skin and the bag.  Leave the ice on for 20 minutes, 2-3 times per day.  Take over-the-counter and prescription medicines only as told by your health care provider.  Do not use tobacco products, including cigarettes, chewing tobacco, and e-cigarettes. If you need help quitting, ask your health care provider.  Keep all follow-up visits as told by your health care provider. This is important. SEEK MEDICAL CARE IF:  You have a fever.  Your chest pain becomes worse.  You have new symptoms. SEEK IMMEDIATE MEDICAL CARE  IF:  You have nausea or vomiting.  You feel sweaty or light-headed.  You have a cough with phlegm (sputum) or you cough up blood.  You develop shortness of breath.   This information is not intended to replace advice given to you by your health care provider. Make sure you discuss any questions you have with your health care provider.   Document Released: 03/13/2005 Document Revised: 12/02/2014 Document Reviewed: 06/08/2014 Elsevier Interactive Patient Education 2016 Elsevier Inc[employer]  Gastritis, Adult Gastritis is soreness and swelling (inflammation) of the lining of the stomach. Gastritis can develop as a sudden onset (acute) or long-term (chronic) condition. If gastritis is not treated, it can lead to stomach bleeding and ulcers. CAUSES  Gastritis occurs when the stomach lining is weak or damaged. Digestive juices from the stomach then inflame the weakened stomach lining. The stomach lining may be weak or damaged due to viral or bacterial infections. One common bacterial infection is the Helicobacter pylori infection. Gastritis can also result from excessive alcohol consumption, taking certain medicines, or having too much acid in the stomach.  SYMPTOMS  In some cases, there are no symptoms. When symptoms are present, they may include:  Pain or a burning sensation in the upper abdomen.  Nausea.  Vomiting.  An uncomfortable feeling of fullness after eating. DIAGNOSIS  Your caregiver may suspect you have gastritis based on your symptoms and a physical exam. To determine the cause of your gastritis, your caregiver may  perform the following:  Blood or stool tests to check for the H pylori bacterium.  Gastroscopy. A thin, flexible tube (endoscope) is passed down the esophagus and into the stomach. The endoscope has a light and camera on the end. Your caregiver uses the endoscope to view the inside of the stomach.  Taking a tissue sample (biopsy) from the stomach to examine under a  microscope. TREATMENT  Depending on the cause of your gastritis, medicines may be prescribed. If you have a bacterial infection, such as an H pylori infection, antibiotics may be given. If your gastritis is caused by too much acid in the stomach, H2 blockers or antacids may be given. Your caregiver may recommend that you stop taking aspirin, ibuprofen, or other nonsteroidal anti-inflammatory drugs (NSAIDs). HOME CARE INSTRUCTIONS  Only take over-the-counter or prescription medicines as directed by your caregiver.  If you were given antibiotic medicines, take them as directed. Finish them even if you start to feel better.  Drink enough fluids to keep your urine clear or pale yellow.  Avoid foods and drinks that make your symptoms worse, such as:  Caffeine or alcoholic drinks.  Chocolate.  Peppermint or mint flavorings.  Garlic and onions.  Spicy foods.  Citrus fruits, such as oranges, lemons, or limes.  Tomato-based foods such as sauce, chili, salsa, and pizza.  Fried and fatty foods.  Eat small, frequent meals instead of large meals. SEEK IMMEDIATE MEDICAL CARE IF:   You have black or dark red stools.  You vomit blood or material that looks like coffee grounds.  You are unable to keep fluids down.  Your abdominal pain gets worse.  You have a fever.  You do not feel better after 1 week.  You have any other questions or concerns. MAKE SURE YOU:  Understand these instructions.  Will watch your condition.  Will get help right away if you are not doing well or get worse.   This information is not intended to replace advice given to you by your health care provider. Make sure you discuss any questions you have with your health care provider.   Document Released: 03/07/2001 Document Revised: 09/12/2011 Document Reviewed: 04/26/2011 Elsevier Interactive Patient Education [employer].

## 2015-09-27 NOTE — ED Provider Notes (Signed)
CSN: [id number]     Arrival date & time 09/27/15  1144 History   First MD Initiated Contact with Patient 09/27/15 1503     Chief Complaint  Patient presents with  . left side pain      (Consider location/radiation/quality/duration/timing/severity/associated sxs/prior Treatment) The history is provided by the patient and a relative.     Pt with hx dementia, afib, CHF p/w left sided chest or upper abdominal pain that began last night overnight.  Described as a "hard pain" that lasts for 1-2 hours.  States first her heart rate increased, then it decreased, then the pain started.  Felt relief with getting up to urinate.  Pt states she has had multiple episodes over a long period of time - per daughter, she believes it has only occurred twice, once 1-2 weeks ago.    Level V caveat for dementia.    Past Medical History  Diagnosis Date  . Chronic diastolic CHF (congestive heart failure) ([place])     a. echo 09/12/13 EF Q000111Q, grade 1 diastolic dysf, mild LVOT obstr, moderate MR, elevated filling pressure  . Hypoglycemia   . Hard of hearing   . Phlebitis     l leg  . Hypertension   . Frequent UTI   . Enlarged liver   . Hemorrhoids   . Dyslipidemia   . OA (osteoarthritis)     hip and back  . Atrial fibrillation ([place])     a. h/o PAF, in NSR during admission 09/11/13  . Allergic rhinitis   . GERD (gastroesophageal reflux disease)   . Prediabetes   . Arrhythmia     a. office to call pt on 09/15/13 for 30 day event monitor  . Breast cancer ([place])   . Mitral regurgitation     a. moderate MR by echo 09/12/2013   Past Surgical History  Procedure Laterality Date  . Abdominal hysterectomy    . Bladder suspension    . Breast lumpectomy    . Foot surgery      left foot  . Inner ear surgery    . Cardiac catheterization  36's    [facility]   Family History  Problem Relation Age of Onset  . Cerebral palsy Sister   . Alzheimer's disease Brother   . Hypertension Father   . Heart disease Brother      Social History  Substance Use Topics  . Smoking status: Former Research scientist (life sciences)  . Smokeless tobacco: Never Used  . Alcohol Use: No   OB History    No data available     Review of Systems  Unable to perform ROS: Dementia      Allergies  Contrast media; Metoprolol; and Pyridium  Home Medications   Prior to Admission medications   Medication Sig Start Date End Date Taking? Authorizing Provider  acetaminophen (TYLENOL) 500 MG tablet Take 500 mg by mouth every 4 (four) hours as needed for mild pain.    Historical Provider, MD  alum & mag hydroxide-simeth ([place]) 200-200-20 MG/5ML suspension Take 30 mLs by mouth every 6 (six) hours as needed for indigestion or heartburn.    Historical Provider, MD  aspirin EC 81 MG tablet Take 81 mg by mouth daily.    Historical Provider, MD  furosemide (LASIX) 40 MG tablet Take 40 mg by mouth 2 (two) times daily.  04/09/12   Historical Provider, MD  guaifenesin (ROBITUSSIN) 100 MG/5ML syrup Take 200 mg by mouth every 6 (six) hours as needed for cough.  Historical Provider, MD  ibuprofen (ADVIL,MOTRIN) 200 MG tablet Take 200 mg by mouth every 6 (six) hours as needed (back pain). Reported on 06/22/2015    Historical Provider, MD  KLOR-CON M20 20 MEQ tablet Take 20 mEq by mouth 2 (two) times daily.  03/23/12   Historical Provider, MD  loperamide (IMODIUM) 2 MG capsule Take 2 mg by mouth as needed for diarrhea or loose stools.    Historical Provider, MD  magnesium hydroxide (MILK OF MAGNESIA) 400 MG/5ML suspension Take 30 mLs by mouth at bedtime as needed for mild constipation.    Historical Provider, MD  Neomycin-Bacitracin-Polymyxin (HCA TRIPLE ANTIBIOTIC OINTMENT EX) Apply 1 application topically daily as needed (skintear/abrasions).    Historical Provider, MD  pantoprazole (PROTONIX) 40 MG tablet Take 40 mg by mouth daily. 10/19/14   Historical Provider, MD  potassium chloride (K-DUR) 10 MEQ tablet Take 4 tablets (40 mEq total) by mouth daily. 09/11/15    Leo Grosser, MD  TOPROL XL 50 MG 24 hr tablet Take 50 mg by mouth daily. Name Brand ONLY 01/29/12   Historical Provider, MD   BP 156/70 mmHg  Pulse 52  Temp(Src) 97.6 F (36.4 C) (Oral)  Resp 16  SpO2 100% Physical Exam  Constitutional: She appears well-developed and well-nourished. No distress.  HENT:  Head: Normocephalic and atraumatic.  Neck: Neck supple.  Cardiovascular: Normal rate and regular rhythm.   Pulmonary/Chest: Effort normal and breath sounds normal. No respiratory distress. She has no wheezes. She has no rales. She exhibits tenderness.    No skin changes or breast mass noted.  No crepitus. Mildly tender throughout left chest with more focal tenderness over left lower anterior ribs.      Abdominal: Soft. She exhibits no distension. There is tenderness. There is no rebound and no guarding.  Neurological: She is alert.  Skin: She is not diaphoretic.  Nursing note and vitals reviewed.   ED Course  Procedures (including critical care time) Labs Review Labs Reviewed  BASIC METABOLIC PANEL - Abnormal; Notable for the following:    Glucose, Bld 102 (*)    GFR calc non Af Amer 50 (*)    GFR calc Af Amer 57 (*)    All other components within normal limits  URINALYSIS, ROUTINE W REFLEX MICROSCOPIC (NOT AT [facility]) - Abnormal; Notable for the following:    Leukocytes, UA SMALL (*)    All other components within normal limits  URINE MICROSCOPIC-ADD ON - Abnormal; Notable for the following:    Squamous Epithelial / LPF 0-5 (*)    Bacteria, UA FEW (*)    All other components within normal limits  I-STAT CG4 LACTIC ACID, ED - Abnormal; Notable for the following:    Lactic Acid, Venous 1.91 (*)    All other components within normal limits  CBC WITH DIFFERENTIAL/PLATELET  HEPATIC FUNCTION PANEL  LIPASE, BLOOD  I-STAT TROPOININ, ED    Imaging Review Ct Abdomen Pelvis Wo Contrast  09/27/2015  CLINICAL DATA:  LEFT side pain for several days, LEFT chest/ LEFT upper quadrant  abdominal pain, dementia, no trauma, tenderness to touch but no obvious bruising noted, chronic diastolic CHF, hypertension, breast cancer, atrial fibrillation, former smoker, prior hysterectomy and bladder suspension EXAM: CT ABDOMEN AND PELVIS WITHOUT CONTRAST TECHNIQUE: Multidetector CT imaging of the abdomen and pelvis was performed following the standard protocol without IV contrast. IV contrast not administered due to history of contrast allergy. Oral contrast was not administered. Sagittal and coronal MPR images reconstructed from axial  data set. COMPARISON:  None FINDINGS: Lower chest: 3 mm subpleural nodule RIGHT middle lobe image 1. 6 mm RIGHT lower lobe nodule image 7. Hepatobiliary: Nodular hepatic margins compatible with cirrhosis. Nonspecific low-attenuation focus inferior RIGHT lobe 5 mm diameter image 29. Gallbladder unremarkable. Pancreas: Normal appearance Spleen: Normal appearance.  Small splenule at splenic hilum. Adrenals/Urinary Tract: Adrenal glands normal appearance. 18 x 18 mm cyst laterally at inferior pole RIGHT kidney. Probable tiny subcapsular cyst posterior LEFT kidney. Additional tiny indeterminate intermediate attenuation exophytic nodule 5 mm diameter lateral LEFT kidney image 27. No hydronephrosis or hydroureter. No urinary tract calcification. Bladder unremarkable. Stomach/Bowel: Moderate-sized hiatal hernia with questionable wall thickening versus artifact from underdistention at the stomach. Appendix not visualized. Scattered diverticula of descending and sigmoid colon without definite evidence of acute diverticulitis. Remaining bowel loops normal appearance. Vascular/Lymphatic: Atherosclerotic calcifications aorta and iliac arteries. No adenopathy. Normal sized lymph nodes within small bowel mesenteries in the LEFT mid abdomen with minimal associated mesenteric stranding, nonspecific, could represent fibrosing mesenteritis. Reproductive: Post hysterectomy. Calcifications within  LEFT ovary. RIGHT ovary not visualized. Other: No free air or free fluid. Musculoskeletal: Degenerative disc disease changes lumbar spine. Osseous demineralization. IMPRESSION: Mild distal colonic diverticulosis without evidence of diverticulitis. Cirrhotic appearing liver. Mild stranding of small bowel mesentery with associated normal size lymph nodes in LEFT upper quadrant, nonspecific, question fibrosing mesenteritis. Small renal cysts with an additional indeterminate intermediate attenuation LEFT renal nodule 5 mm diameter. Aortic atherosclerosis. Moderate-sized hiatal hernia with questionable wall thickening versus artifact from underdistention at the hernia sac; consider followup endoscopy to exclude gastric wall thickening. RIGHT lung base nodules, recommendation below. Non-contrast chest CT at 3-6 months is recommended. If the nodules are stable at time of repeat CT, then future CT at 18-24 months (from today's scan) is considered optional for low-risk patients, but is recommended for high-risk patients. This recommendation follows the consensus statement: Guidelines for Management of Incidental Pulmonary Nodules Detected on CT Images:From the Fleischner Society 2017; published online before print (10.1148/radiol.IJ:2314499). Electronically Signed   By: Lavonia Dana M.D.   On: 09/27/2015 16:32   Dg Chest 2 View  09/27/2015  CLINICAL DATA:  Left-sided pain for several days.  No known injury. EXAM: CHEST  2 VIEW COMPARISON:  08/12/2015 FINDINGS: Moderate-sized hiatal hernia. Heart and mediastinal contours are within normal limits. No focal opacities or effusions. No acute bony abnormality. IMPRESSION: Moderate-sized hiatal hernia.  No active cardiopulmonary disease. Electronically Signed   By: Rolm Baptise M.D.   On: 09/27/2015 12:58   I have personally reviewed and evaluated these images and lab results as part of my medical decision-making.   EKG Interpretation None       ED ECG REPORT   Date:  09/27/2015  Rate: 58  Rhythm: normal sinus rhythm  QRS Axis: normal  Intervals: PR prolonged  ST/T Wave abnormalities: normal  Conduction Disutrbances:none  Narrative Interpretation:   Old EKG Reviewed: none available  I have personally reviewed the EKG tracing and agree with the computerized printout as noted.   MDM   Final diagnoses:  Chest wall pain  Gastritis  Hiatal hernia    Afebrile nontoxic patient with hx HTN, dementia p/w LUQ abdominal pain/left chest pain that occurred overnight.  Pt tender to palpation in these areas, particularly over left lower anterior ribs.  Labs reassuring.  Lactic acid is 1.91, high end of normal range is 1.9.  CXR demonstrates hiatal hernia without other acute findings. Abdominal CT significant for mild stranding of  mesentery with question of fibrosing mesenteritis and hiatal hernia with possible gastric thickening. Doubt mesenteric ischemia.  Pt pain free throughout visit. Discussed pt with Dr Lita Mains who also saw and examined the patient, discussed results with patient and daughter.  Discussed pt with Dr Lita Mains. Pt to be d/c with pain medication for chest wall pain, close PCP follow up.   Pt given return precautions.  I doubt any other EMC precluding discharge at this time including, but not necessarily limited to the following:  ACS, PE, aortic dissection, pneuothorax, pneumonia, mesenteric ischemic, colitis.     Clayton Bibles, PA-C 09/27/15 [place], MD 09/28/15 [phone]

## 2015-09-29 LAB — URINE CULTURE

## 2015-10-04 DIAGNOSIS — I483 Typical atrial flutter: Secondary | ICD-10-CM | POA: Diagnosis not present

## 2015-10-04 DIAGNOSIS — G301 Alzheimer's disease with late onset: Secondary | ICD-10-CM | POA: Diagnosis not present

## 2015-10-04 DIAGNOSIS — F0280 Dementia in other diseases classified elsewhere without behavioral disturbance: Secondary | ICD-10-CM | POA: Diagnosis not present

## 2015-10-04 DIAGNOSIS — I5032 Chronic diastolic (congestive) heart failure: Secondary | ICD-10-CM | POA: Diagnosis not present

## 2015-10-04 DIAGNOSIS — R079 Chest pain, unspecified: Secondary | ICD-10-CM | POA: Diagnosis not present

## 2015-10-13 ENCOUNTER — Encounter: Payer: Self-pay | Admitting: Cardiovascular Disease

## 2015-10-13 ENCOUNTER — Ambulatory Visit: Payer: Medicare Other | Admitting: Cardiovascular Disease

## 2015-10-13 ENCOUNTER — Ambulatory Visit (INDEPENDENT_AMBULATORY_CARE_PROVIDER_SITE_OTHER): Payer: Medicare Other | Admitting: Cardiovascular Disease

## 2015-10-13 VITALS — BP 140/74 | HR 64 | Ht 62.0 in | Wt 136.4 lb

## 2015-10-13 DIAGNOSIS — I480 Paroxysmal atrial fibrillation: Secondary | ICD-10-CM | POA: Diagnosis not present

## 2015-10-13 DIAGNOSIS — I5032 Chronic diastolic (congestive) heart failure: Secondary | ICD-10-CM | POA: Diagnosis not present

## 2015-10-13 DIAGNOSIS — R079 Chest pain, unspecified: Secondary | ICD-10-CM

## 2015-10-13 MED ORDER — ISOSORBIDE MONONITRATE ER 30 MG PO TB24: 30.0000 mg | ORAL_TABLET | Freq: Every day | ORAL | Status: AC

## 2015-10-13 NOTE — Patient Instructions (Signed)
Medication Instructions:  START Imdur 30 mg once daily   Labwork: None Ordered   Testing/Procedures: None Ordered   Follow-Up: Your physician wants you to follow-up in: 6 months with Dr. Acie Fredrickson.  You will receive a reminder letter in the mail two months in advance. If you don't receive a letter, please call our office to schedule the follow-up appointment.   If you need a refill on your cardiac medications before your next appointment, please call your pharmacy.   Thank you for choosing [facility]! Christen Bame, RN [phone]

## 2015-10-13 NOTE — Progress Notes (Signed)
Alexis Winters Date of Birth  Apr 10, 1927       [facility] [address], [place], [place]  [address]   [place], [place]  [address] [place]   Fax  [phone]     Fax [phone]  Problem List: 1. Atrial fibrillation 2. Congestive heart failure 3. Cardiac cath with cardiac arrest  History of Present Illness:  Alexis Winters is a 82 yo with hx of CHF, palpitations ( lasts for a minute or so).  This past Friday, she was seen at her medical doctors office - tachycardia, fatigue, lots of peripheral edema.    She has these spells on occasion.    For the past 2 weeks she has had progressive "spells" with severe dyspnea, profound fatigue,  Some mild , vague chest tightness, tachypalpitations.  She also has some left hand tingling.    October 21, 2013:  Alexis Winters was admitted to the hospital with vague symptoms -- ? CP and increased dyspnea. Echo shows normal LV function.  Moderate MR. She is doing better.  Has home health coming out  Her dementia is gradually worsening.   October 13, 2015:  Alexis Winters  is seen today for follow up of her atrial fib.   Was seen with daughter, Alexis Winters.  Converted spontanously to NSR   Troponin levels were mildy elevated  She has occasional left sided chest pain  Beneath her left breast.   Lasts 1-2 minutes.    Current Outpatient Prescriptions on File Prior to Visit  Medication Sig Dispense Refill  . acetaminophen (TYLENOL) 500 MG tablet Take 500 mg by mouth every 4 (four) hours as needed for mild pain.    Marland Kitchen alum & mag hydroxide-simeth (MINTOX) [id number] MG/5ML suspension Take 30 mLs by mouth every 6 (six) hours as needed for indigestion or heartburn.    Marland Kitchen aspirin EC 81 MG tablet Take 81 mg by mouth daily.    . famotidine (PEPCID) 20 MG tablet Take 1 tablet (20 mg total) by mouth 2 (two) times daily. 30 tablet 0  . furosemide (LASIX) 40 MG tablet Take 40  mg by mouth 2 (two) times daily.     Marland Kitchen guaifenesin (ROBITUSSIN) 100 MG/5ML syrup Take 200 mg by mouth every 6 (six) hours as needed for cough.    Marland Kitchen ibuprofen (ADVIL,MOTRIN) 200 MG tablet Take 200 mg by mouth every 6 (six) hours as needed (back pain). Reported on 06/22/2015    . loperamide (IMODIUM) 2 MG capsule Take 2 mg by mouth as needed for diarrhea or loose stools.    . magnesium hydroxide (MILK OF MAGNESIA) 400 MG/5ML suspension Take 30 mLs by mouth at bedtime as needed for mild constipation.    Marland Kitchen Neomycin-Bacitracin-Polymyxin (HCA TRIPLE ANTIBIOTIC OINTMENT EX) Apply 1 application topically daily as needed (skintear/abrasions).    . pantoprazole (PROTONIX) 40 MG tablet Take 40 mg by mouth daily.  3  . potassium chloride (K-DUR) 10 MEQ tablet Take 4 tablets (40 mEq total) by mouth daily. 20 tablet 0  . TOPROL XL 50 MG 24 hr tablet Take 50 mg by mouth daily. Name Brand ONLY    . traMADol (ULTRAM) 50 MG tablet Take 1 tablet (50 mg total) by mouth every 6 (six) hours as needed for moderate pain or severe pain. 15 tablet 0   No current facility-administered medications on file prior to visit.  Allergies  Allergen Reactions  . Contrast Media [Iodinated Diagnostic Agents] Other (See Comments)    Cardiac arrest  . Metoprolol Other (See Comments)    Per daughter "causes her heart to race" Takes brand name Toprol XL only  . Pyridium [Phenazopyridine Hcl] Swelling    unknown    Past Medical History  Diagnosis Date  . Chronic diastolic CHF (congestive heart failure) ([place])     a. echo 09/12/13 EF Q000111Q, grade 1 diastolic dysf, mild LVOT obstr, moderate MR, elevated filling pressure  . Hypoglycemia   . Hard of hearing   . Phlebitis     l leg  . Hypertension   . Frequent UTI   . Enlarged liver   . Hemorrhoids   . Dyslipidemia   . OA (osteoarthritis)     hip and back  . Atrial fibrillation ([place])     a. h/o PAF, in NSR during admission 09/11/13  . Allergic rhinitis   . GERD  (gastroesophageal reflux disease)   . Prediabetes   . Arrhythmia     a. office to call pt on 09/15/13 for 30 day event monitor  . Breast cancer ([place])   . Mitral regurgitation     a. moderate MR by echo 09/12/2013    Past Surgical History  Procedure Laterality Date  . Abdominal hysterectomy    . Bladder suspension    . Breast lumpectomy    . Foot surgery      left foot  . Inner ear surgery    . Cardiac catheterization  1980's    [facility]    History  Smoking status  . Former Smoker  Smokeless tobacco  . Never Used    History  Alcohol Use No    Family History  Problem Relation Age of Onset  . Cerebral palsy Sister   . Alzheimer's disease Brother   . Hypertension Father   . Heart disease Brother     Reviw of Systems:  Reviewed in the HPI.  All other systems are negative.  Physical Exam: Blood pressure 140/74, pulse 64, height 5\' 2"  (1.575 m), weight 136 lb 6.4 oz (61.871 kg). Wt Readings from Last 3 Encounters:  10/13/15 136 lb 6.4 oz (61.871 kg)  06/23/15 138 lb 6.4 oz (62.778 kg)  01/12/15 126 lb (57.153 kg)     General: Frail, elderly white female. She is hard of hearing. She seems to be easily distracted. I suspect that she has mild dementia.  Head: Normocephalic, atraumatic, sclera non-icteric, mucus membranes are moist,   Neck: Supple. Carotids are 2 + without bruits. No JVD   Lungs: Clear   Heart:  Regular rate, S1-S2.  Abdomen: Soft, non-tender, non-distended with normal bowel sounds.  Msk:  Strength and tone are normal   Extremities: No clubbing or cyanosis. 2+ pitting edema.  The legs appear to be chronically edematous.  Distal pedal pulses are 2+ and equal  She's quite frail. She walks very slowly and seemed to need a lot of assistance. We had to assist her  getting up and down off the exam table.  Neuro: CN II - XII intact.  Except she is partially deaf.  Alert and oriented X 3.   Psych:   She seems to have at least mild dementia. Her hearing  impairment makes assessment of this difficulty.  ECG: 10/13/2013: Normal sinus rhythm at 65. RV conduction delay.  Assessment / Plan:   1. Atrial fibrillation-   Currently in normal sinus rhythm 2. Congestive heart  failure 3. Cardiac cath with cardiac arrest 4. Chest discomfort: Alexis Winters reports having chest pain with her episodes of atrial fibrillation. During one of her admissions to mild elevations of troponin. It's quite likely that she has some demand ischemia causing these chest pains.  We had a discussion and told her that we will not be able to prevent these episodes of atrial fibrillation but hopefully by adding some isosorbide, we can help relieve some of the chest discomfort.  We may also consider adding sublingual nitroglycerin. These episodes last for such a short time but I don't think his sublingual nitroglycerin will necessarily help.    Mertie Moores, MD  10/13/2015 3:40 PM    [facility] [place],  [place], [place]  [address] Pager [phone] Phone: [phone]; Fax: [phone]

## 2015-10-25 DIAGNOSIS — D485 Neoplasm of uncertain behavior of skin: Secondary | ICD-10-CM | POA: Diagnosis not present

## 2015-12-20 DIAGNOSIS — N390 Urinary tract infection, site not specified: Secondary | ICD-10-CM | POA: Diagnosis not present

## 2015-12-20 DIAGNOSIS — R350 Frequency of micturition: Secondary | ICD-10-CM | POA: Diagnosis not present

## 2015-12-21 DIAGNOSIS — R319 Hematuria, unspecified: Secondary | ICD-10-CM | POA: Diagnosis not present

## 2015-12-21 DIAGNOSIS — Z79899 Other long term (current) drug therapy: Secondary | ICD-10-CM | POA: Diagnosis not present

## 2015-12-21 DIAGNOSIS — N390 Urinary tract infection, site not specified: Secondary | ICD-10-CM | POA: Diagnosis not present

## 2016-01-04 DIAGNOSIS — N390 Urinary tract infection, site not specified: Secondary | ICD-10-CM | POA: Diagnosis not present

## 2016-01-04 DIAGNOSIS — Z79899 Other long term (current) drug therapy: Secondary | ICD-10-CM | POA: Diagnosis not present

## 2016-01-04 DIAGNOSIS — R69 Illness, unspecified: Secondary | ICD-10-CM | POA: Diagnosis not present

## 2016-01-04 DIAGNOSIS — R319 Hematuria, unspecified: Secondary | ICD-10-CM | POA: Diagnosis not present

## 2016-01-10 DIAGNOSIS — N390 Urinary tract infection, site not specified: Secondary | ICD-10-CM | POA: Diagnosis not present

## 2016-01-10 DIAGNOSIS — Z881 Allergy status to other antibiotic agents status: Secondary | ICD-10-CM | POA: Diagnosis not present

## 2016-01-12 DIAGNOSIS — N3020 Other chronic cystitis without hematuria: Secondary | ICD-10-CM | POA: Diagnosis not present

## 2016-01-16 DIAGNOSIS — Z23 Encounter for immunization: Secondary | ICD-10-CM | POA: Diagnosis not present

## 2016-01-24 ENCOUNTER — Emergency Department (HOSPITAL_COMMUNITY): Payer: Medicare Other

## 2016-01-24 ENCOUNTER — Encounter (HOSPITAL_COMMUNITY): Payer: Self-pay

## 2016-01-24 ENCOUNTER — Emergency Department (HOSPITAL_COMMUNITY)
Admission: EM | Admit: 2016-01-24 | Discharge: 2016-01-24 | Disposition: A | Discharge status: Home / Self Care | Payer: Medicare Other | Attending: Emergency Medicine | Admitting: Emergency Medicine

## 2016-01-24 DIAGNOSIS — I110 Hypertensive heart disease with heart failure: Secondary | ICD-10-CM | POA: Diagnosis not present

## 2016-01-24 DIAGNOSIS — R079 Chest pain, unspecified: Secondary | ICD-10-CM | POA: Diagnosis not present

## 2016-01-24 DIAGNOSIS — I5032 Chronic diastolic (congestive) heart failure: Secondary | ICD-10-CM | POA: Insufficient documentation

## 2016-01-24 DIAGNOSIS — R0789 Other chest pain: Secondary | ICD-10-CM | POA: Diagnosis not present

## 2016-01-24 DIAGNOSIS — Z7982 Long term (current) use of aspirin: Secondary | ICD-10-CM | POA: Insufficient documentation

## 2016-01-24 DIAGNOSIS — Z87891 Personal history of nicotine dependence: Secondary | ICD-10-CM | POA: Insufficient documentation

## 2016-01-24 DIAGNOSIS — Z79899 Other long term (current) drug therapy: Secondary | ICD-10-CM | POA: Diagnosis not present

## 2016-01-24 DIAGNOSIS — Z853 Personal history of malignant neoplasm of breast: Secondary | ICD-10-CM | POA: Insufficient documentation

## 2016-01-24 LAB — CBC WITH DIFFERENTIAL/PLATELET
BASOS ABS: 0 10*3/uL (ref 0.0–0.1)
Basophils Relative: 0 %
EOS ABS: 0.1 10*3/uL (ref 0.0–0.7)
EOS PCT: 2 %
HCT: 42.7 % (ref 36.0–46.0)
HEMOGLOBIN: 14.1 g/dL (ref 12.0–15.0)
LYMPHS PCT: 18 %
Lymphs Abs: 0.9 10*3/uL (ref 0.7–4.0)
MCH: 29.6 pg (ref 26.0–34.0)
MCHC: 33 g/dL (ref 30.0–36.0)
MCV: 89.5 fL (ref 78.0–100.0)
Monocytes Absolute: 0.4 10*3/uL (ref 0.1–1.0)
Monocytes Relative: 7 %
NEUTROS PCT: 73 %
Neutro Abs: 3.6 10*3/uL (ref 1.7–7.7)
Platelets: 142 10*3/uL — ABNORMAL LOW (ref 150–400)
RBC: 4.77 MIL/uL (ref 3.87–5.11)
RDW: 13.3 % (ref 11.5–15.5)
WBC: 5 10*3/uL (ref 4.0–10.5)

## 2016-01-24 LAB — BASIC METABOLIC PANEL
ANION GAP: 9 (ref 5–15)
BUN: 19 mg/dL (ref 6–20)
CO2: 26 mmol/L (ref 22–32)
Calcium: 9.2 mg/dL (ref 8.9–10.3)
Chloride: 106 mmol/L (ref 101–111)
Creatinine, Ser: 0.88 mg/dL (ref 0.44–1.00)
GFR calc Af Amer: >60 mL/min (ref >60)
GFR, EST NON AFRICAN AMERICAN: 58 mL/min — AB (ref >60)
Glucose, Bld: 193 mg/dL — ABNORMAL HIGH (ref 65–99)
POTASSIUM: 4 mmol/L (ref 3.5–5.1)
SODIUM: 141 mmol/L (ref 135–145)

## 2016-01-24 LAB — I-STAT TROPONIN, ED: TROPONIN I, POC: 0 ng/mL (ref 0.00–0.08)

## 2016-01-24 LAB — CBG MONITORING, ED: GLUCOSE-CAPILLARY: 226 mg/dL — AB (ref 65–99)

## 2016-01-24 NOTE — ED Notes (Signed)
Myself and Alecia, RN undressed patient, in gown, on monitor, continuous pulse oximetry and blood pressure cuff

## 2016-01-24 NOTE — ED Triage Notes (Signed)
Pt. BIB [facility] from [facility]. The facility called out related to HR of 160 and pt. Bent over in chest pain. Per EMS "upon arrival and moving around HR 60". CBG in field 420. Hx. Dementia. Daughter at bedside.

## 2016-01-24 NOTE — ED Provider Notes (Signed)
[facility] DEPT Provider Note   CSN: [id number] Arrival date & time: 01/24/16  0932     History   Chief Complaint Chief Complaint  Patient presents with  . Chest Pain    HPI Alexis Winters is a 82 y.o. female.  HPI Alexis Winters is a 82 y.o. female with hx of dementia, CHF, Afib, GERD, Mitral regurg, HTN, presents to ED with complaint of chest pain. Pt states she just finished eating breakfast when her pain began. Pt is from [facility]. Pt states she had sharp chest pain in left chest. According to staff, pt was holding on to the chest complaining of pain. Pt was transported here. Pt received no treatment, pain now resolved. Unknown how long pain lasted. Daughter states similar episodes in the past, states was related to afib. States sometimes would have to come to the hospital to get treatment. Pt has no complaints at this time.   Past Medical History:  Diagnosis Date  . Allergic rhinitis   . Arrhythmia    a. office to call pt on 09/15/13 for 30 day event monitor  . Atrial fibrillation ([place])    a. h/o PAF, in NSR during admission 09/11/13  . Breast cancer ([place])   . Chronic diastolic CHF (congestive heart failure) ([place])    a. echo 09/12/13 EF Q000111Q, grade 1 diastolic dysf, mild LVOT obstr, moderate MR, elevated filling pressure  . Dyslipidemia   . Enlarged liver   . Frequent UTI   . GERD (gastroesophageal reflux disease)   . Hard of hearing   . Hemorrhoids   . Hypertension   . Hypoglycemia   . Mitral regurgitation    a. moderate MR by echo 09/12/2013  . OA (osteoarthritis)    hip and back  . Phlebitis    l leg  . Prediabetes     Patient Active Problem List   Diagnosis Date Noted  . Atrial flutter, paroxysmal ([place]) 06/22/2015  . Near syncope 01/10/2015  . Neck pain on left side 01/10/2015  . Pulmonary nodule, right 01/10/2015  . Leukocytosis 01/10/2015  . Hypocalcemia 01/10/2015  . Dyspnea 01/10/2015  . Chest pain 09/11/2013  . Bilateral  neck pain 09/11/2013  . Angina at rest [facility]) 09/11/2013  . Leg edema 09/05/2013  . Failure to thrive 09/05/2013  . Paroxysmal atrial fibrillation ([place]) 09/05/2013  . Chronic diastolic CHF (congestive heart failure) ([place]) 09/05/2013    Past Surgical History:  Procedure Laterality Date  . ABDOMINAL HYSTERECTOMY    . BLADDER SUSPENSION    . BREAST LUMPECTOMY    . CARDIAC CATHETERIZATION  1980's   [facility]  . FOOT SURGERY     left foot  . INNER EAR SURGERY      OB History    No data available       Home Medications    Prior to Admission medications   Medication Sig Start Date End Date Taking? Authorizing Provider  acetaminophen (TYLENOL) 500 MG tablet Take 500 mg by mouth every 4 (four) hours as needed for mild pain.   Yes Historical Provider, MD  alum & mag hydroxide-simeth (MINTOX) 200-200-20 MG/5ML suspension Take 30 mLs by mouth every 6 (six) hours as needed for indigestion or heartburn.   Yes Historical Provider, MD  aspirin EC 81 MG tablet Take 81 mg by mouth daily.   Yes Historical Provider, MD  Cranberry 450 MG TABS Take 1 tablet by mouth 2 (two) times daily.   Yes Historical Provider, MD  estradiol (ESTRACE) 0.1 MG/GM vaginal cream Place 1 Applicatorful vaginally 2 (two) times a week.   Yes Historical Provider, MD  furosemide (LASIX) 40 MG tablet Take 40 mg by mouth 2 (two) times daily.  04/09/12  Yes Historical Provider, MD  guaifenesin (ROBITUSSIN) 100 MG/5ML syrup Take 200 mg by mouth every 6 (six) hours as needed for cough.   Yes Historical Provider, MD  hyoscyamine (LEVSIN, ANASPAZ) 0.125 MG tablet Take 0.125 mg by mouth every 12 (twelve) hours as needed for bladder spasms.   Yes Historical Provider, MD  ibuprofen (ADVIL,MOTRIN) 200 MG tablet Take 200 mg by mouth every 6 (six) hours as needed (back pain). Reported on 06/22/2015   Yes Historical Provider, MD  isosorbide mononitrate (IMDUR) 30 MG 24 hr tablet Take 1 tablet (30 mg total) by mouth daily. 10/13/15  Yes Thayer Headings, MD  loperamide (IMODIUM) 2 MG capsule Take 2 mg by mouth as needed for diarrhea or loose stools.   Yes Historical Provider, MD  magnesium hydroxide (MILK OF MAGNESIA) 400 MG/5ML suspension Take 30 mLs by mouth at bedtime as needed for mild constipation.   Yes Historical Provider, MD  Neomycin-Bacitracin-Polymyxin (HCA TRIPLE ANTIBIOTIC OINTMENT EX) Apply 1 application topically daily as needed (skintear/abrasions).   Yes Historical Provider, MD  pantoprazole (PROTONIX) 40 MG tablet Take 40 mg by mouth daily. 10/19/14  Yes Historical Provider, MD  potassium chloride (K-DUR) 10 MEQ tablet Take 4 tablets (40 mEq total) by mouth daily. 09/11/15  Yes Leo Grosser, MD  TOPROL XL 50 MG 24 hr tablet Take 50 mg by mouth daily. Name Brand ONLY 01/29/12  Yes Historical Provider, MD  famotidine (PEPCID) 20 MG tablet Take 1 tablet (20 mg total) by mouth 2 (two) times daily. Patient not taking: Reported on 01/24/2016 09/27/15   Clayton Bibles, PA-C  traMADol (ULTRAM) 50 MG tablet Take 1 tablet (50 mg total) by mouth every 6 (six) hours as needed for moderate pain or severe pain. Patient not taking: Reported on 01/24/2016 09/27/15   Clayton Bibles, PA-C    Family History Family History  Problem Relation Age of Onset  . Cerebral palsy Sister   . Alzheimer's disease Brother   . Hypertension Father   . Heart disease Brother     Social History Social History  Substance Use Topics  . Smoking status: Former Research scientist (life sciences)  . Smokeless tobacco: Never Used  . Alcohol use No     Allergies   Contrast media [iodinated diagnostic agents]; Metoprolol; and Pyridium [phenazopyridine hcl]   Review of Systems Review of Systems  Constitutional: Negative for chills and fever.  Respiratory: Positive for chest tightness. Negative for cough and shortness of breath.   Cardiovascular: Positive for chest pain. Negative for palpitations and leg swelling.  Gastrointestinal: Negative for abdominal pain, diarrhea, nausea and vomiting.   Genitourinary: Negative for dysuria, flank pain and pelvic pain.  Musculoskeletal: Negative for arthralgias, myalgias, neck pain and neck stiffness.  Skin: Negative for rash.  Neurological: Negative for dizziness, weakness and headaches.  All other systems reviewed and are negative.    Physical Exam Updated Vital Signs BP 127/61 (BP Location: Right Arm)   Pulse 76   Resp 22   Ht 4\' 11"  (1.499 m)   Wt 59.9 kg   BMI 26.66 kg/m   Physical Exam  Constitutional: She is oriented to person, place, and time. She appears well-developed and well-nourished. No distress.  HENT:  Head: Normocephalic.  Eyes: Conjunctivae are normal.  Neck: Neck  supple.  Cardiovascular: Normal rate, regular rhythm and normal heart sounds.   Pulmonary/Chest: Effort normal and breath sounds normal. No respiratory distress. She has no wheezes. She has no rales.  Abdominal: Soft. Bowel sounds are normal. She exhibits no distension. There is no tenderness. There is no rebound.  Musculoskeletal: She exhibits no edema.  Neurological: She is alert and oriented to person, place, and time.  Skin: Skin is warm and dry.  Psychiatric: She has a normal mood and affect. Her behavior is normal.  Nursing note and vitals reviewed.    ED Treatments / Results  Labs (all labs ordered are listed, but only abnormal results are displayed) Labs Reviewed  CBC WITH DIFFERENTIAL/PLATELET - Abnormal; Notable for the following:       Result Value   Platelets 142 (*)    All other components within normal limits  BASIC METABOLIC PANEL - Abnormal; Notable for the following:    Glucose, Bld 193 (*)    GFR calc non Af Amer 58 (*)    All other components within normal limits  CBG MONITORING, ED - Abnormal; Notable for the following:    Glucose-Capillary 226 (*)    All other components within normal limits  I-STAT TROPOININ, ED    EKG  EKG Interpretation  Date/Time:  Monday January 24 2016 09:35:49 EDT Ventricular Rate:   82 PR Interval:    QRS Duration: 97 QT Interval:  393 QTC Calculation: 459 R Axis:   7 Text Interpretation:  Sinus rhythm Abnormal R-wave progression, early transition Probable left ventricular hypertrophy Borderline T abnormalities, inferior leads Confirmed by HAVILAND MD, JULIE ([id number]) on 01/24/2016 9:54:15 AM       Radiology Dg Chest 2 View  Result Date: 01/24/2016 CLINICAL DATA:  Heart rate of 160, chest pain. EXAM: CHEST  2 VIEW COMPARISON:  PA and lateral chest x-ray of December 28, 2015 FINDINGS: The lungs are adequately inflated and clear. The heart is top-normal in size. The pulmonary vascularity is normal. There is calcification in the wall of the aortic arch. There is a moderate-sized hiatal hernia. There is no pleural effusion. The bony thorax exhibits no acute abnormality. There are vascular surgical clips in the left breast. IMPRESSION: There is no CHF, pneumonia, nor other acute cardiopulmonary abnormality. Aortic atherosclerosis. Moderate-sized hiatal hernia. Electronically Signed   By: David  Martinique M.D.   On: 01/24/2016 10:45    Procedures Procedures (including critical care time)  Medications Ordered in ED Medications - No data to display   Initial Impression / Assessment and Plan / ED Course  I have reviewed the triage vital signs and the nursing notes.  Pertinent labs & imaging results that were available during my care of the patient were reviewed by me and considered in my medical decision making (see chart for details).  Clinical Course   Pt with episode of chest pain, from SNF. Pain free at this time. NSR on ECG with no acute findings. Will check labs, trop, chest xray.   11:22 AM Patient's blood work unremarkable, glucose slightly elevated at 193.  Otherwise negative labs, EKG, chest x-ray.  Patient continues to have no symptoms or pain.  She is in sinus rhythm.  Discussed with daughter, she is comfortable taking her back to the facility.  Return  precautions discussed.  Vitals:   01/24/16 0946 01/24/16 1052  BP: 127/61 152/67  Pulse: 76 74  Resp: 22 22     Final Clinical Impressions(s) / ED Diagnoses   Final diagnoses:  Chest pain, unspecified type    New Prescriptions New Prescriptions   No medications on file     Jeannett Senior, PA-C 01/24/16 [place]    Isla Pence, MD 01/24/16 1221

## 2016-01-24 NOTE — ED Notes (Signed)
CBG 226 

## 2016-01-24 NOTE — ED Notes (Signed)
EKG performed by Estill Batten, RN and handed to Gilford Raid, MD by me

## 2016-01-24 NOTE — ED Notes (Signed)
Patient transported to X-ray 

## 2016-01-24 NOTE — Discharge Instructions (Signed)
Please follow up with cardiology as needed. Return if worsening pain or any new concerning symptoms.

## 2016-01-24 NOTE — ED Notes (Signed)
Driven back to facility by daughter. Daughter verbalized understanding of teaching with no questions.

## 2016-01-31 DIAGNOSIS — F0280 Dementia in other diseases classified elsewhere without behavioral disturbance: Secondary | ICD-10-CM | POA: Diagnosis not present

## 2016-01-31 DIAGNOSIS — R079 Chest pain, unspecified: Secondary | ICD-10-CM | POA: Diagnosis not present

## 2016-01-31 DIAGNOSIS — G301 Alzheimer's disease with late onset: Secondary | ICD-10-CM | POA: Diagnosis not present

## 2016-01-31 DIAGNOSIS — K219 Gastro-esophageal reflux disease without esophagitis: Secondary | ICD-10-CM | POA: Diagnosis not present

## 2016-01-31 DIAGNOSIS — Q401 Congenital hiatus hernia: Secondary | ICD-10-CM | POA: Diagnosis not present

## 2016-02-04 DIAGNOSIS — N3020 Other chronic cystitis without hematuria: Secondary | ICD-10-CM | POA: Diagnosis not present

## 2016-02-12 ENCOUNTER — Ambulatory Visit: Payer: Medicare Other

## 2016-02-12 DIAGNOSIS — L200 Besnier's prurigo: Secondary | ICD-10-CM | POA: Diagnosis not present

## 2016-02-12 DIAGNOSIS — T7840XA Allergy, unspecified, initial encounter: Secondary | ICD-10-CM | POA: Diagnosis not present

## 2016-02-14 DIAGNOSIS — F0280 Dementia in other diseases classified elsewhere without behavioral disturbance: Secondary | ICD-10-CM | POA: Diagnosis not present

## 2016-02-14 DIAGNOSIS — G301 Alzheimer's disease with late onset: Secondary | ICD-10-CM | POA: Diagnosis not present

## 2016-02-14 DIAGNOSIS — N390 Urinary tract infection, site not specified: Secondary | ICD-10-CM | POA: Diagnosis not present

## 2016-02-14 DIAGNOSIS — A09 Infectious gastroenteritis and colitis, unspecified: Secondary | ICD-10-CM | POA: Diagnosis not present

## 2016-02-14 DIAGNOSIS — K449 Diaphragmatic hernia without obstruction or gangrene: Secondary | ICD-10-CM | POA: Diagnosis not present

## 2016-03-13 DIAGNOSIS — I5022 Chronic systolic (congestive) heart failure: Secondary | ICD-10-CM | POA: Diagnosis not present

## 2016-03-13 DIAGNOSIS — J069 Acute upper respiratory infection, unspecified: Secondary | ICD-10-CM | POA: Diagnosis not present

## 2016-03-13 DIAGNOSIS — F0280 Dementia in other diseases classified elsewhere without behavioral disturbance: Secondary | ICD-10-CM | POA: Diagnosis not present

## 2016-03-13 DIAGNOSIS — G301 Alzheimer's disease with late onset: Secondary | ICD-10-CM | POA: Diagnosis not present

## 2016-03-28 ENCOUNTER — Emergency Department (HOSPITAL_COMMUNITY)
Admission: EM | Admit: 2016-03-28 | Discharge: 2016-03-28 | Disposition: A | Discharge status: Home / Self Care | Payer: Medicare Other | Attending: Emergency Medicine | Admitting: Emergency Medicine

## 2016-03-28 DIAGNOSIS — Z87891 Personal history of nicotine dependence: Secondary | ICD-10-CM | POA: Diagnosis not present

## 2016-03-28 DIAGNOSIS — Y999 Unspecified external cause status: Secondary | ICD-10-CM | POA: Diagnosis not present

## 2016-03-28 DIAGNOSIS — W228XXA Striking against or struck by other objects, initial encounter: Secondary | ICD-10-CM | POA: Insufficient documentation

## 2016-03-28 DIAGNOSIS — Z7982 Long term (current) use of aspirin: Secondary | ICD-10-CM | POA: Insufficient documentation

## 2016-03-28 DIAGNOSIS — Y929 Unspecified place or not applicable: Secondary | ICD-10-CM | POA: Diagnosis not present

## 2016-03-28 DIAGNOSIS — Y939 Activity, unspecified: Secondary | ICD-10-CM | POA: Diagnosis not present

## 2016-03-28 DIAGNOSIS — Z853 Personal history of malignant neoplasm of breast: Secondary | ICD-10-CM | POA: Insufficient documentation

## 2016-03-28 DIAGNOSIS — S81811A Laceration without foreign body, right lower leg, initial encounter: Secondary | ICD-10-CM | POA: Insufficient documentation

## 2016-03-28 DIAGNOSIS — I5032 Chronic diastolic (congestive) heart failure: Secondary | ICD-10-CM | POA: Insufficient documentation

## 2016-03-28 DIAGNOSIS — I110 Hypertensive heart disease with heart failure: Secondary | ICD-10-CM | POA: Insufficient documentation

## 2016-03-28 MED ORDER — LIDOCAINE HCL (PF) 1 % IJ SOLN
5.0000 mL | Freq: Once | INTRAMUSCULAR | Status: AC
  Administered 2016-03-28: 5 mL via INTRADERMAL
  Filled 2016-03-28: qty 30

## 2016-03-28 MED ORDER — BACITRACIN ZINC 500 UNIT/GM EX OINT
TOPICAL_OINTMENT | Freq: Once | CUTANEOUS | Status: AC
  Administered 2016-03-28: 1 via TOPICAL
  Filled 2016-03-28: qty 0.9

## 2016-03-28 NOTE — ED Provider Notes (Signed)
[facility] DE[facility] Provider Note   CSN: [id number] Arrival date & time: 03/28/16  1216  By signing my name below, I, Sonum Patel, attest that this documentation has been prepared under the direction and in the presence of Virgel Manifold, MD. Electronically Signed: Sonum Patel, Education administrator. 03/28/16. 2:30 PM.  History   Chief Complaint No chief complaint on file.   The history is provided by a relative. No language interpreter was used.    LEVEL 5 CAVEAT: Dementia  HPI Comments: Alexis Winters is a 83 y.o. female brought in by ambulance, who presents to the Emergency Department complaining of a right shin laceration that occurred PTA. Per daughter, patient was walking near a falling resident whose walker struck her shin, causing her injury. Patient has a history of dementia and is currently at baseline. Daughter states patient's last tetanus update was 2013.  Past Medical History:  Diagnosis Date  . Allergic rhinitis   . Arrhythmia    a. office to call pt on 09/15/13 for 30 day event monitor  . Atrial fibrillation ([place])    a. h/o PAF, in NSR during admission 09/11/13  . Breast cancer ([place])   . Chronic diastolic CHF (congestive heart failure) ([place])    a. echo 09/12/13 EF Q000111Q, grade 1 diastolic dysf, mild LVOT obstr, moderate MR, elevated filling pressure  . Dyslipidemia   . Enlarged liver   . Frequent UTI   . GERD (gastroesophageal reflux disease)   . Hard of hearing   . Hemorrhoids   . Hypertension   . Hypoglycemia   . Mitral regurgitation    a. moderate MR by echo 09/12/2013  . OA (osteoarthritis)    hip and back  . Phlebitis    l leg  . Prediabetes     Patient Active Problem List   Diagnosis Date Noted  . Atrial flutter, paroxysmal ([place]) 06/22/2015  . Near syncope 01/10/2015  . Neck pain on left side 01/10/2015  . Pulmonary nodule, right 01/10/2015  . Leukocytosis 01/10/2015  . Hypocalcemia 01/10/2015  . Dyspnea 01/10/2015  . Chest pain 09/11/2013  . Bilateral neck  pain 09/11/2013  . Angina at rest [facility]) 09/11/2013  . Leg edema 09/05/2013  . Failure to thrive 09/05/2013  . Paroxysmal atrial fibrillation ([place]) 09/05/2013  . Chronic diastolic CHF (congestive heart failure) ([place]) 09/05/2013    Past Surgical History:  Procedure Laterality Date  . ABDOMINAL HYSTERECTOMY    . BLADDER SUSPENSION    . BREAST LUMPECTOMY    . CARDIAC CATHETERIZATION  1980's   [facility]  . FOOT SURGERY     left foot  . INNER EAR SURGERY      OB History    No data available       Home Medications    Prior to Admission medications   Medication Sig Start Date End Date Taking? Authorizing Provider  acetaminophen (TYLENOL) 500 MG tablet Take 500 mg by mouth every 4 (four) hours as needed for mild pain.   Yes Historical Provider, MD  alum & mag hydroxide-simeth (MINTOX) 200-200-20 MG/5ML suspension Take 30 mLs by mouth every 6 (six) hours as needed for indigestion or heartburn.   Yes Historical Provider, MD  aspirin EC 81 MG tablet Take 81 mg by mouth daily.   Yes Historical Provider, MD  Cranberry 450 MG TABS Take 1 tablet by mouth 2 (two) times daily.   Yes Historical Provider, MD  estradiol (ESTRACE) 0.1 MG/GM vaginal cream Place 1 Applicatorful vaginally 2 (two) times a  week.   Yes Historical Provider, MD  furosemide (LASIX) 40 MG tablet Take 40 mg by mouth 2 (two) times daily.  04/09/12  Yes Historical Provider, MD  glycopyrrolate (ROBINUL) 1 MG tablet Take 1 mg by mouth every 8 (eight) hours as needed.   Yes Historical Provider, MD  guaifenesin (ROBITUSSIN) 100 MG/5ML syrup Take 200 mg by mouth every 6 (six) hours as needed for cough.   Yes Historical Provider, MD  ibuprofen (ADVIL,MOTRIN) 200 MG tablet Take 200 mg by mouth every 6 (six) hours as needed (back pain). Reported on 06/22/2015   Yes Historical Provider, MD  isosorbide mononitrate (IMDUR) 30 MG 24 hr tablet Take 1 tablet (30 mg total) by mouth daily. 10/13/15  Yes Thayer Headings, MD  magnesium hydroxide (MILK OF  MAGNESIA) 400 MG/5ML suspension Take 30 mLs by mouth at bedtime as needed for mild constipation.   Yes Historical Provider, MD  mirabegron ER (MYRBETRIQ) 25 MG TB24 tablet Take 25 mg by mouth daily.   Yes Historical Provider, MD  Neomycin-Bacitracin-Polymyxin (HCA TRIPLE ANTIBIOTIC OINTMENT EX) Apply 1 application topically daily as needed (skintear/abrasions).   Yes Historical Provider, MD  pantoprazole (PROTONIX) 40 MG tablet Take 40 mg by mouth daily. 10/19/14  Yes Historical Provider, MD  potassium chloride (K-DUR) 10 MEQ tablet Take 4 tablets (40 mEq total) by mouth daily. 09/11/15  Yes Leo Grosser, MD  TOPROL XL 50 MG 24 hr tablet Take 50 mg by mouth daily. Name Brand ONLY 01/29/12  Yes Historical Provider, MD  famotidine (PEPCID) 20 MG tablet Take 1 tablet (20 mg total) by mouth 2 (two) times daily. Patient not taking: Reported on 03/28/2016 09/27/15   Clayton Bibles, PA-C  traMADol (ULTRAM) 50 MG tablet Take 1 tablet (50 mg total) by mouth every 6 (six) hours as needed for moderate pain or severe pain. Patient not taking: Reported on 01/24/2016 09/27/15   Clayton Bibles, PA-C    Family History Family History  Problem Relation Age of Onset  . Cerebral palsy Sister   . Alzheimer's disease Brother   . Hypertension Father   . Heart disease Brother     Social History Social History  Substance Use Topics  . Smoking status: Former Research scientist (life sciences)  . Smokeless tobacco: Never Used  . Alcohol use No     Allergies   Contrast media [iodinated diagnostic agents]; Metoprolol; and Pyridium [phenazopyridine hcl]   Review of Systems Review of Systems  Unable to perform ROS: Dementia     Physical Exam Updated Vital Signs BP 169/60 (BP Location: Right Arm)   Pulse 63   Temp 98 F (36.7 C) (Oral)   Resp 16   SpO2 99%   Physical Exam  Constitutional: She appears well-developed and well-nourished. No distress.  HENT:  Head: Normocephalic and atraumatic.  Eyes: EOM are normal.  Neck: Normal range of  motion.  Cardiovascular: Normal rate, regular rhythm and normal heart sounds.   Pulmonary/Chest: Effort normal and breath sounds normal.  Abdominal: Soft. She exhibits no distension. There is no tenderness.  Musculoskeletal: Normal range of motion. She exhibits no tenderness.  Neurological: She is alert.  Skin: Skin is warm and dry.  4 cm crescent shaped laceration to the right lower shin. No active bleeding. No significant soft tissue tenderness. NVI   Psychiatric: She has a normal mood and affect. Judgment normal.  Nursing note and vitals reviewed.    ED Treatments / Results  DIAGNOSTIC STUDIES: Oxygen Saturation is 99% on RA, normal by  my interpretation.    COORDINATION OF CARE: 2:16 PM Discussed treatment plan with pt at bedside and pt agreed to plan.   Labs (all labs ordered are listed, but only abnormal results are displayed) Labs Reviewed - No data to display  EKG  EKG Interpretation None       Radiology No results found.  Procedures Procedures (including critical care time) LACERATION REPAIR PROCEDURE NOTE The patient's identification was confirmed and consent was obtained. This procedure was performed by Virgel Manifold, MD at 3:36 PM. Site: right lower shin Sterile procedures observed Anesthetic used (type and amt): 1% lidocaine without epi, 3 cc Suture type/size:3-0 prolene Length: 4 cm # of Sutures: 6 Technique: horizontal mattress Antibx ointment applied Tetanus UTD or ordered Site anesthetized, irrigated with NS, explored without evidence of foreign body, wound well approximated, site covered with dry, sterile dressing.  Patient tolerated procedure well without complications. Instructions for care discussed verbally and patient provided with additional written instructions for homecare and f/u.   Medications Ordered in ED Medications - No data to display   Initial Impression / Assessment and Plan / ED Course  I have reviewed the triage vital  signs and the nursing notes.  Pertinent labs & imaging results that were available during my care of the patient were reviewed by me and considered in my medical decision making (see chart for details).  Clinical Course     0000000 with uncomplicated R shin laceration. Highly doubt bony injury. NVI. Closed. Continued wound care.   Final Clinical Impressions(s) / ED Diagnoses   Final diagnoses:  Leg laceration, right, initial encounter    New Prescriptions New Prescriptions   No medications on file   I personally preformed the services scribed in my presence. The recorded information has been reviewed is accurate. Virgel Manifold, MD.    Virgel Manifold, MD 04/04/16 1556

## 2016-03-28 NOTE — ED Notes (Signed)
Bed: WA06 Expected date:  Expected time:  Means of arrival:  Comments: EMS 80's, shin laceration

## 2016-03-28 NOTE — Discharge Instructions (Signed)
Suture removal in 10-14 days.

## 2016-03-28 NOTE — ED Triage Notes (Signed)
Per EMS, pt is coming from [facility] regarding a laceration to the right shin. A resident fell and pushed the pt walker into her shin causing the laceration. Pt has a hx of dementia and currently at baseline orientation.

## 2016-04-27 ENCOUNTER — Inpatient Hospital Stay (HOSPITAL_COMMUNITY): Payer: Medicare Other

## 2016-04-27 ENCOUNTER — Inpatient Hospital Stay (HOSPITAL_COMMUNITY)
Admission: EM | Admit: 2016-04-27 | Discharge: 2016-04-29 | DRG: 309 | Disposition: A | Discharge status: Home / Self Care | Payer: Medicare Other | Attending: Interventional Cardiology | Admitting: Interventional Cardiology

## 2016-04-27 ENCOUNTER — Emergency Department (HOSPITAL_COMMUNITY): Payer: Medicare Other

## 2016-04-27 ENCOUNTER — Encounter (HOSPITAL_COMMUNITY): Payer: Self-pay | Admitting: Emergency Medicine

## 2016-04-27 DIAGNOSIS — Z8672 Personal history of thrombophlebitis: Secondary | ICD-10-CM

## 2016-04-27 DIAGNOSIS — I110 Hypertensive heart disease with heart failure: Secondary | ICD-10-CM | POA: Diagnosis present

## 2016-04-27 DIAGNOSIS — Y92129 Unspecified place in nursing home as the place of occurrence of the external cause: Secondary | ICD-10-CM | POA: Diagnosis not present

## 2016-04-27 DIAGNOSIS — I480 Paroxysmal atrial fibrillation: Secondary | ICD-10-CM | POA: Diagnosis present

## 2016-04-27 DIAGNOSIS — R55 Syncope and collapse: Secondary | ICD-10-CM | POA: Diagnosis present

## 2016-04-27 DIAGNOSIS — E876 Hypokalemia: Secondary | ICD-10-CM | POA: Diagnosis present

## 2016-04-27 DIAGNOSIS — R001 Bradycardia, unspecified: Secondary | ICD-10-CM | POA: Diagnosis present

## 2016-04-27 DIAGNOSIS — R0602 Shortness of breath: Secondary | ICD-10-CM | POA: Diagnosis not present

## 2016-04-27 DIAGNOSIS — K219 Gastro-esophageal reflux disease without esophagitis: Secondary | ICD-10-CM | POA: Diagnosis present

## 2016-04-27 DIAGNOSIS — H9190 Unspecified hearing loss, unspecified ear: Secondary | ICD-10-CM | POA: Diagnosis present

## 2016-04-27 DIAGNOSIS — I5032 Chronic diastolic (congestive) heart failure: Secondary | ICD-10-CM | POA: Diagnosis present

## 2016-04-27 DIAGNOSIS — Z66 Do not resuscitate: Secondary | ICD-10-CM | POA: Diagnosis present

## 2016-04-27 DIAGNOSIS — Z888 Allergy status to other drugs, medicaments and biological substances status: Secondary | ICD-10-CM

## 2016-04-27 DIAGNOSIS — R600 Localized edema: Secondary | ICD-10-CM

## 2016-04-27 DIAGNOSIS — R21 Rash and other nonspecific skin eruption: Secondary | ICD-10-CM | POA: Diagnosis present

## 2016-04-27 DIAGNOSIS — E785 Hyperlipidemia, unspecified: Secondary | ICD-10-CM | POA: Diagnosis present

## 2016-04-27 DIAGNOSIS — Z853 Personal history of malignant neoplasm of breast: Secondary | ICD-10-CM | POA: Diagnosis not present

## 2016-04-27 DIAGNOSIS — L03115 Cellulitis of right lower limb: Secondary | ICD-10-CM | POA: Diagnosis present

## 2016-04-27 DIAGNOSIS — Z87440 Personal history of urinary (tract) infections: Secondary | ICD-10-CM | POA: Diagnosis not present

## 2016-04-27 DIAGNOSIS — Z820 Family history of epilepsy and other diseases of the nervous system: Secondary | ICD-10-CM | POA: Diagnosis not present

## 2016-04-27 DIAGNOSIS — K5900 Constipation, unspecified: Secondary | ICD-10-CM | POA: Diagnosis present

## 2016-04-27 DIAGNOSIS — R410 Disorientation, unspecified: Secondary | ICD-10-CM | POA: Diagnosis not present

## 2016-04-27 DIAGNOSIS — W228XXA Striking against or struck by other objects, initial encounter: Secondary | ICD-10-CM | POA: Diagnosis present

## 2016-04-27 DIAGNOSIS — Z87891 Personal history of nicotine dependence: Secondary | ICD-10-CM | POA: Diagnosis not present

## 2016-04-27 DIAGNOSIS — Z90710 Acquired absence of both cervix and uterus: Secondary | ICD-10-CM

## 2016-04-27 DIAGNOSIS — Z8249 Family history of ischemic heart disease and other diseases of the circulatory system: Secondary | ICD-10-CM

## 2016-04-27 DIAGNOSIS — S80811A Abrasion, right lower leg, initial encounter: Secondary | ICD-10-CM | POA: Diagnosis present

## 2016-04-27 DIAGNOSIS — Z91041 Radiographic dye allergy status: Secondary | ICD-10-CM

## 2016-04-27 DIAGNOSIS — Z79899 Other long term (current) drug therapy: Secondary | ICD-10-CM

## 2016-04-27 DIAGNOSIS — F0390 Unspecified dementia without behavioral disturbance: Secondary | ICD-10-CM | POA: Diagnosis present

## 2016-04-27 DIAGNOSIS — R4182 Altered mental status, unspecified: Secondary | ICD-10-CM

## 2016-04-27 DIAGNOSIS — I340 Nonrheumatic mitral (valve) insufficiency: Secondary | ICD-10-CM | POA: Diagnosis present

## 2016-04-27 DIAGNOSIS — R0600 Dyspnea, unspecified: Secondary | ICD-10-CM | POA: Diagnosis present

## 2016-04-27 LAB — MAGNESIUM: Magnesium: 2.4 mg/dL (ref 1.7–2.4)

## 2016-04-27 LAB — COMPREHENSIVE METABOLIC PANEL
ALBUMIN: 3.3 g/dL — AB (ref 3.5–5.0)
ALT: 17 U/L (ref 14–54)
ANION GAP: 8 (ref 5–15)
AST: 28 U/L (ref 15–41)
Alkaline Phosphatase: 80 U/L (ref 38–126)
BILIRUBIN TOTAL: 0.9 mg/dL (ref 0.3–1.2)
BUN: 22 mg/dL — ABNORMAL HIGH (ref 6–20)
CHLORIDE: 108 mmol/L (ref 101–111)
CO2: 25 mmol/L (ref 22–32)
Calcium: 8.6 mg/dL — ABNORMAL LOW (ref 8.9–10.3)
Creatinine, Ser: 1.05 mg/dL — ABNORMAL HIGH (ref 0.44–1.00)
GFR calc Af Amer: 54 mL/min — ABNORMAL LOW (ref >60)
GFR calc non Af Amer: 46 mL/min — ABNORMAL LOW (ref >60)
GLUCOSE: 135 mg/dL — AB (ref 65–99)
POTASSIUM: 3.6 mmol/L (ref 3.5–5.1)
Sodium: 141 mmol/L (ref 135–145)
TOTAL PROTEIN: 5.9 g/dL — AB (ref 6.5–8.1)

## 2016-04-27 LAB — CBC WITH DIFFERENTIAL/PLATELET
BASOS PCT: 0 %
Basophils Absolute: 0 10*3/uL (ref 0.0–0.1)
Eosinophils Absolute: 0.1 10*3/uL (ref 0.0–0.7)
Eosinophils Relative: 2 %
HEMATOCRIT: 39.6 % (ref 36.0–46.0)
HEMOGLOBIN: 13 g/dL (ref 12.0–15.0)
LYMPHS ABS: 0.9 10*3/uL (ref 0.7–4.0)
Lymphocytes Relative: 22 %
MCH: 29.4 pg (ref 26.0–34.0)
MCHC: 32.8 g/dL (ref 30.0–36.0)
MCV: 89.6 fL (ref 78.0–100.0)
Monocytes Absolute: 0.3 10*3/uL (ref 0.1–1.0)
Monocytes Relative: 8 %
NEUTROS ABS: 2.8 10*3/uL (ref 1.7–7.7)
NEUTROS PCT: 68 %
Platelets: 136 10*3/uL — ABNORMAL LOW (ref 150–400)
RBC: 4.42 MIL/uL (ref 3.87–5.11)
RDW: 12.9 % (ref 11.5–15.5)
WBC: 4.1 10*3/uL (ref 4.0–10.5)

## 2016-04-27 LAB — I-STAT CG4 LACTIC ACID, ED: LACTIC ACID, VENOUS: 1.6 mmol/L (ref 0.5–1.9)

## 2016-04-27 LAB — I-STAT CHEM 8, ED
BUN: 26 mg/dL — AB (ref 6–20)
CHLORIDE: 106 mmol/L (ref 101–111)
Calcium, Ion: 1.11 mmol/L — ABNORMAL LOW (ref 1.15–1.40)
Creatinine, Ser: 0.9 mg/dL (ref 0.44–1.00)
GLUCOSE: 127 mg/dL — AB (ref 65–99)
HCT: 38 % (ref 36.0–46.0)
Hemoglobin: 12.9 g/dL (ref 12.0–15.0)
POTASSIUM: 3.6 mmol/L (ref 3.5–5.1)
Sodium: 144 mmol/L (ref 135–145)
TCO2: 27 mmol/L (ref 0–100)

## 2016-04-27 LAB — BRAIN NATRIURETIC PEPTIDE: B Natriuretic Peptide: 250.8 pg/mL — ABNORMAL HIGH (ref 0.0–100.0)

## 2016-04-27 LAB — LIPASE, BLOOD: LIPASE: 37 U/L (ref 11–51)

## 2016-04-27 LAB — MRSA PCR SCREENING: MRSA BY PCR: NEGATIVE

## 2016-04-27 LAB — I-STAT TROPONIN, ED: Troponin i, poc: 0 ng/mL (ref 0.00–0.08)

## 2016-04-27 MED ORDER — FUROSEMIDE 10 MG/ML IJ SOLN
40.0000 mg | Freq: Two times a day (BID) | INTRAMUSCULAR | Status: AC
  Administered 2016-04-27 – 2016-04-28 (×3): 40 mg via INTRAVENOUS
  Filled 2016-04-27 (×3): qty 4

## 2016-04-27 MED ORDER — MIDAZOLAM HCL 2 MG/2ML IJ SOLN
2.5000 mg | Freq: Once | INTRAMUSCULAR | Status: AC
  Administered 2016-04-27: 2 mg via INTRAVENOUS

## 2016-04-27 MED ORDER — MIDAZOLAM HCL 2 MG/2ML IJ SOLN: 2.0000 mg | Freq: Once | INTRAMUSCULAR | Status: DC

## 2016-04-27 MED ORDER — HYDRALAZINE HCL 20 MG/ML IJ SOLN
10.0000 mg | INTRAMUSCULAR | Status: DC | PRN
  Administered 2016-04-27: 10 mg via INTRAVENOUS
  Filled 2016-04-27: qty 1

## 2016-04-27 MED ORDER — SODIUM CHLORIDE 0.9 % IV BOLUS (SEPSIS)
500.0000 mL | Freq: Once | INTRAVENOUS | Status: AC
  Administered 2016-04-27: 500 mL via INTRAVENOUS

## 2016-04-27 MED ORDER — MIDAZOLAM HCL 2 MG/2ML IJ SOLN
INTRAMUSCULAR | Status: AC
  Filled 2016-04-27: qty 4

## 2016-04-27 MED ORDER — HEPARIN SODIUM (PORCINE) 5000 UNIT/ML IJ SOLN
5000.0000 [IU] | Freq: Three times a day (TID) | INTRAMUSCULAR | Status: DC
  Administered 2016-04-27 – 2016-04-29 (×6): 5000 [IU] via SUBCUTANEOUS
  Filled 2016-04-27 (×7): qty 1

## 2016-04-27 NOTE — ED Notes (Addendum)
Per daughter-- pt walks with walker, gets self dressed, feeds herself, does get confused.    Daughter Judie Mean-- [phone]

## 2016-04-27 NOTE — H&P (Addendum)
Cardiology H&P    Patient ID: Alexis Winters MRN: [id number], DOB/AGE: January 27, 1928   Admit date: 04/27/2016 Date of Consult: 04/27/2016  Primary Physician: No PCP Per Patient  Primary Cardiologist: Dr. Acie Fredrickson   Patient Profile    Alexis Winters is a 83 year old female with a past medical history of PAF, Chronic diastolic CHF, HLD, HTN. She presented with altered mental status and bradycardia.   History of Present Illness  Alexis Winters lives at a skilled nursing facility in the Alzheimer's unit. She was found this morning slumped over in a chair, unresponsive. Upon EMS arrival, her HR was 36, external pacing was initiated. She became responsive for a few minutes, but again lost responsiveness in the EMS truck. No loss of pulse, no CPR initiated.   Upon arrival to the ED, external pacing pads were turned off and she was noted to be in sinus bradycardia with rates in the 40-50's.   She is on Toprol at home - 50mg  daily. Her last dose was 04/26/16.   She is minimally responsive at the time of my encounter, she awakens to voice. Her family tells me that she is highly functional. She takes care of all her ADL's, gets up every morning and dresses and applies make up. She is active at her living facility. She is forgetful and does tend to repeat herself according to family.    Past Medical History   Past Medical History:  Diagnosis Date  . Allergic rhinitis   . Arrhythmia    a. office to call pt on 09/15/13 for 30 day event monitor  . Atrial fibrillation ([place])    a. h/o PAF, in NSR during admission 09/11/13  . Breast cancer ([place])   . Chronic diastolic CHF (congestive heart failure) ([place])    a. echo 09/12/13 EF Q000111Q, grade 1 diastolic dysf, mild LVOT obstr, moderate MR, elevated filling pressure  . Dyslipidemia   . Enlarged liver   . Frequent UTI   . GERD (gastroesophageal reflux disease)   . Hard of hearing   . Hemorrhoids   . Hypertension   . Hypoglycemia   . Mitral regurgitation      a. moderate MR by echo 09/12/2013  . OA (osteoarthritis)    hip and back  . Phlebitis    l leg  . Prediabetes     Past Surgical History:  Procedure Laterality Date  . ABDOMINAL HYSTERECTOMY    . BLADDER SUSPENSION    . BREAST LUMPECTOMY    . CARDIAC CATHETERIZATION  1980's   [facility]  . FOOT SURGERY     left foot  . INNER EAR SURGERY       Allergies  Allergies  Allergen Reactions  . Contrast Media [Iodinated Diagnostic Agents] Other (See Comments)    Cardiac arrest  . Metoprolol Other (See Comments)    Per daughter "causes her heart to race" Takes brand name Toprol XL only  . Pyridium [Phenazopyridine Hcl] Swelling    unknown    Inpatient Medications    . midazolam      . midazolam  2 mg Intravenous Once    Family History    Family History  Problem Relation Age of Onset  . Cerebral palsy Sister   . Alzheimer's disease Brother   . Hypertension Father   . Heart disease Brother     Social History    Social History   Social History  . Marital status: Widowed    Spouse name: N/A  .  Number of children: N/A  . Years of education: N/A   Occupational History  . Not on file.   Social History Main Topics  . Smoking status: Former Research scientist (life sciences)  . Smokeless tobacco: Never Used  . Alcohol use No  . Drug use: No  . Sexual activity: No   Other Topics Concern  . Not on file   Social History Narrative  . No narrative on file     Review of Systems   Limited due to patient's lethargy.  General:  No chills, fever, night sweats or weight changes.  Cardiovascular:  No chest pain, dyspnea on exertion, edema, orthopnea, palpitations, paroxysmal nocturnal dyspnea. Dermatological: No rash, lesions/masses Respiratory: No cough, dyspnea Urologic: No hematuria, dysuria Abdominal:   No nausea, vomiting, diarrhea, bright red blood per rectum, melena, or hematemesis Neurologic:  No visual changes, wkns, changes in mental status. All other systems reviewed and are otherwise  negative except as noted above.  Physical Exam    Blood pressure 148/68, pulse (!) 55, resp. rate 12, height 4\' 11"  (1.499 m), weight 132 lb (59.9 kg), SpO2 100 %.  General: Obtunded ill appearing female  Psych: Unable to asses.  Neuro:  Moves all extremities spontaneously. HEENT: Normal  Neck: Supple without bruits or JVD. Lungs:  Resp regular and unlabored, CTA. Heart: RRR no s3, s4, or murmurs. Abdomen: Soft, non-tender, non-distended, BS + x 4.  Extremities: No clubbing, cyanosis + 3 pretibial edema. DP/PT/Radials 2+ and equal bilaterally.  Labs    Troponin [facility] of Care Test)  Recent Labs  04/27/16 0955  TROPIPOC 0.00   No results for input(s): CKTOTAL, CKMB, TROPONINI in the last 72 hours. Lab Results  Component Value Date   WBC 4.1 04/27/2016   HGB 12.9 04/27/2016   HCT 38.0 04/27/2016   MCV 89.6 04/27/2016   PLT 136 (L) 04/27/2016    Recent Labs Lab 04/27/16 0936 04/27/16 0957  NA 141 144  K 3.6 3.6  CL 108 106  CO2 25  --   BUN 22* 26*  CREATININE 1.05* 0.90  CALCIUM 8.6*  --   PROT 5.9*  --   BILITOT 0.9  --   ALKPHOS 80  --   ALT 17  --   AST 28  --   GLUCOSE 135* 127*   No results found for: CHOL, HDL, LDLCALC, TRIG Lab Results  Component Value Date   DDIMER 0.66 (H) 01/10/2015     Radiology Studies    Dg Chest Portable 1 View  Result Date: 04/27/2016 CLINICAL DATA:  Patient unresponsive.  Bradycardia. EXAM: PORTABLE CHEST 1 VIEW COMPARISON:  Chest radiograph 01/24/2016. FINDINGS: Pacer pad overlies the patient. Monitoring leads overlie the patient. Low lung volumes. Stable cardiac and mediastinal contours. Aortic atherosclerosis. No large area of pulmonary consolidation. Minimal basilar atelectasis. No pleural effusion or pneumothorax. IMPRESSION: Low lung volumes with basilar atelectasis. Electronically Signed   By: Lovey Newcomer M.D.   On: 04/27/2016 09:57    EKG & Cardiac Imaging    EKG: Sinus bradycardia   Echocardiogram: pending    Assessment & Plan    1. Severe bradycardia: HR in 30's upon EMS arrival. Now in the 40-50's (no longer requiring external pacing). Her last dose of Toprol was 04/26/16. Will need to wash out for about 48 hours and then see what her HR does. Can get EP to see if needed.   She is a DNR and her family seems respectful of her wishes. She is functional at  baseline, still takes care of all her ADL's.   2. History of chronic diastolic CHF: She has marked 3+ pretibial edema. She was on 40mg  po Lasix at home. Will hold off on IV diuresis until she stabilizes. Likely her edema is chronic in nature as her BNP is only 250. Chest X ray without significant edema.   3. Questionable RLE cellulitis: patient has an abrasion on her RLE from bumping into someone else's wheelchair. Looks like some cellulitis around it. Lactic acid normal, no leukocytosis. Can give IV doxycycline to treat (patient not stable for po's now).   Signed, Arbutus Leas, NP 04/27/2016, 11:43 AM Pager: [phone]   I have examined the patient and reviewed assessment and plan and discussed with patient.  Agree with above as stated.  I was called to see the patient emergently due to concerns about low heart rate and possible need for temporary pacemaker. When I arrived, she had heart rates in the 50s with a blood pressure about 0000000 systolic. She was reporting pain but the external pacemaker was not firing.  She was not able to answer more detailed questions at that particular time. I adjusted the settings on the pacemaker to set the rate at 50 bpm to prevent any unnecessary external pacing. I spoke with the family at length. It appears that she is on some rate slowing drugs. The facility where the patient stays reported that she had heart rates as low as the 30s earlier today. Unclear whether she had syncope. Apparently, the patient remains quite active on a day-to-day basis. She also has a wound on her right shin from a traumatic injury. She has  bilateral lower extremity edema. The right leg is particularly erythematous. Will treat with antibiotics and with Lasix to help with edema. She is not tolerating oral medications at this time. Hopefully, withholding the rate slowing drugs will prevent any further bradycardia. If there are issues with tachybradycardia syndrome, may need to get electrophysiology involved.    Admit to ICU for close heart rate monitoring.  Critical care time 45 minutes.  Larae Grooms

## 2016-04-27 NOTE — ED Notes (Signed)
Family at bedside. -- daughter here, jewelry given to daughter.

## 2016-04-27 NOTE — ED Notes (Signed)
Pt not being paced at present-- sinus brady on monitor

## 2016-04-27 NOTE — Progress Notes (Signed)
Pt arrived from ED at 14:07. Pt assessed. Pt only arousable to sternal rub. Pt very lethargic. Pupils 6 bilaterally, very sluggish. Pt SBP 205. Pt speech garbled. Paged NP and rapid response to bedside to assess. NP to place orders for hydralazine PRN.  NP to discuss with MD. MD states no new orders. Will continue to monitor closely.  Shelba Flake, RN

## 2016-04-27 NOTE — ED Notes (Signed)
Report called to [place], [place]

## 2016-04-27 NOTE — ED Triage Notes (Addendum)
To ED via [place] found in chair at [facility] in Alzheimer's Unit slumped in chair, unresponsive-- when EMS arrived-- pt heartrate was 36-- placed on pacer pads- paced at m-amp40, then at 70, rate 60. Pt became unresponsive again enroute.  On arrival to ED, pt was unresponsive -- placed on ED pacer pads, switched from EMS pads-- pacer turned off-- pt in sinus brady-- DR. Long at bedside.

## 2016-04-27 NOTE — ED Provider Notes (Signed)
Emergency Department Provider Note   I have reviewed the triage vital signs and the nursing notes.   HISTORY  Chief Complaint No chief complaint on file.   HPI YANEIRY CULLIMORE is a 83 y.o. female with PMH of a fib, HLD, HTN, dCHF, MR and Dementia presents to the emergency department from [facility] by EMS after being found minimally responsive sitting in chair. The patient was found to be bradycardic on arrival with low blood pressure. Paramedics initiated trans-cutaneous pacing which improved her blood pressure. They report that her mental status baseline is talkative but confused. She was intermittently grimacing during pacing.   Level 5 caveat: Patient with dementia and transcutaneous pacing    Past Medical History:  Diagnosis Date  . Allergic rhinitis   . Arrhythmia    a. office to call pt on 09/15/13 for 30 day event monitor  . Atrial fibrillation ([place])    a. h/o PAF, in NSR during admission 09/11/13  . Breast cancer ([place])   . Chronic diastolic CHF (congestive heart failure) ([place])    a. echo 09/12/13 EF Q000111Q, grade 1 diastolic dysf, mild LVOT obstr, moderate MR, elevated filling pressure  . Dyslipidemia   . Enlarged liver   . Frequent UTI   . GERD (gastroesophageal reflux disease)   . Hard of hearing   . Hemorrhoids   . Hypertension   . Hypoglycemia   . Mitral regurgitation    a. moderate MR by echo 09/12/2013  . OA (osteoarthritis)    hip and back  . Phlebitis    l leg  . Prediabetes     Patient Active Problem List   Diagnosis Date Noted  . Atrial flutter, paroxysmal ([place]) 06/22/2015  . Near syncope 01/10/2015  . Neck pain on left side 01/10/2015  . Pulmonary nodule, right 01/10/2015  . Leukocytosis 01/10/2015  . Hypocalcemia 01/10/2015  . Dyspnea 01/10/2015  . Chest pain 09/11/2013  . Bilateral neck pain 09/11/2013  . Angina at rest [facility]) 09/11/2013  . Leg edema 09/05/2013  . Failure to thrive 09/05/2013  . Paroxysmal atrial fibrillation ([place])  09/05/2013  . Chronic diastolic CHF (congestive heart failure) ([place]) 09/05/2013    Past Surgical History:  Procedure Laterality Date  . ABDOMINAL HYSTERECTOMY    . BLADDER SUSPENSION    . BREAST LUMPECTOMY    . CARDIAC CATHETERIZATION  1980's   [facility]  . FOOT SURGERY     left foot  . INNER EAR SURGERY      Current Outpatient Rx  . Order #: [id number] Class: Historical Med  . Order #: [id number] Class: Historical Med  . Order #: [id number] Class: Historical Med  . Order #: [id number] Class: Historical Med  . Order #: [id number] Class: Historical Med  . Order #: [id number] Class: Print  . Order #: [id number] Class: Historical Med  . Order #: [id number] Class: Historical Med  . Order #: [id number] Class: Historical Med  . Order #: [id number] Class: Historical Med  . Order #: [id number] Class: Print  . Order #: [id number] Class: Historical Med  . Order #: [id number] Class: Historical Med  . Order #: [id number] Class: Historical Med  . Order #: [id number] Class: Historical Med  . Order #: [id number] Class: Print  . Order #: [id number] Class: Historical Med  . Order #: [id number] Class: Print    Allergies Contrast media [iodinated diagnostic agents]; Metoprolol; and Pyridium [phenazopyridine hcl]  Family History  Problem Relation Age of Onset  . Cerebral palsy Sister   . Alzheimer's disease Brother   . Hypertension Father   . Heart disease Brother  Social History Social History  Substance Use Topics  . Smoking status: Former Research scientist (life sciences)  . Smokeless tobacco: Never Used  . Alcohol use No    Review of Systems  Level 5 caveat: Transcutaneous pacing and dementia.   ____________________________________________   PHYSICAL EXAM:  VITAL SIGNS: ED Triage Vitals  Enc Vitals Group     BP 04/27/16 0915 147/64     Pulse Rate 04/27/16 0915 60     Resp 04/27/16 0915 25     Temp --      Temp src --      SpO2 04/27/16 0910 100 %     Weight --      Height --      Head Circumference --      Peak Flow --       Pain Score --      Pain Loc --      Pain Edu? --      Excl. in [place]? --    Constitutional: Somnolent. Grimacing with pacer shocks. External pacing in progress.  Eyes: Conjunctivae are normal.  Head: Atraumatic. Nose: No congestion/rhinnorhea. Mouth/Throat: Mucous membranes are dry. Oropharynx non-erythematous. Neck: No stridor.   Cardiovascular: Normal rate, regular rhythm. Good peripheral circulation. Grossly normal heart sounds.   Respiratory: Normal respiratory effort.  No retractions. Lungs CTAB. Gastrointestinal: Soft and nontender. No distention.  Musculoskeletal: No lower extremity tenderness nor edema. No gross deformities of extremities. Neurologic:  Somnolent. Moving all extremities but not following commands.  Skin:  Skin is warm, dry and intact. No rash noted.  ____________________________________________   LABS (all labs ordered are listed, but only abnormal results are displayed)  Labs Reviewed  COMPREHENSIVE METABOLIC PANEL - Abnormal; Notable for the following:       Result Value   Glucose, Bld 135 (*)    BUN 22 (*)    Creatinine, Ser 1.05 (*)    Calcium 8.6 (*)    Total Protein 5.9 (*)    Albumin 3.3 (*)    GFR calc non Af Amer 46 (*)    GFR calc Af Amer 54 (*)    All other components within normal limits  BRAIN NATRIURETIC PEPTIDE - Abnormal; Notable for the following:    B Natriuretic Peptide 250.8 (*)    All other components within normal limits  CBC WITH DIFFERENTIAL/PLATELET - Abnormal; Notable for the following:    Platelets 136 (*)    All other components within normal limits  I-STAT CHEM 8, ED - Abnormal; Notable for the following:    BUN 26 (*)    Glucose, Bld 127 (*)    Calcium, Ion 1.11 (*)    All other components within normal limits  LIPASE, BLOOD  MAGNESIUM  I-STAT TROPOININ, ED  I-STAT CG4 LACTIC ACID, ED   ____________________________________________  EKG   EKG Interpretation  Date/Time:  Thursday April 27 2016 09:16:26  EST Ventricular Rate:  57 PR Interval:    QRS Duration: 100 QT Interval:  448 QTC Calculation: 437 R Axis:   35 Text Interpretation:  Sinus rhythm Prolonged PR interval Low voltage, precordial leads Abnormal R-wave progression, early transition Borderline T abnormalities, inferior leads No STEMI.  Confirmed by Artyom Stencel MD, Aven Christen [phone]) on 04/27/2016 11:24:00 AM       ____________________________________________  RADIOLOGY  Dg Chest Portable 1 View  Result Date: 04/27/2016 CLINICAL DATA:  Patient unresponsive.  Bradycardia. EXAM: PORTABLE CHEST 1 VIEW COMPARISON:  Chest radiograph 01/24/2016. FINDINGS: Pacer pad overlies the patient. Monitoring leads  overlie the patient. Low lung volumes. Stable cardiac and mediastinal contours. Aortic atherosclerosis. No large area of pulmonary consolidation. Minimal basilar atelectasis. No pleural effusion or pneumothorax. IMPRESSION: Low lung volumes with basilar atelectasis. Electronically Signed   By: Lovey Newcomer M.D.   On: 04/27/2016 09:57    ____________________________________________   PROCEDURES  Procedure(s) performed:   Procedures  CRITICAL CARE Performed by: Margette Fast Total critical care time: 30 minutes Critical care time was exclusive of separately billable procedures and treating other patients. Critical care was necessary to treat or prevent imminent or life-threatening deterioration. Critical care was time spent personally by me on the following activities: development of treatment plan with patient and/or surrogate as well as nursing, discussions with consultants, evaluation of patient's response to treatment, examination of patient, obtaining history from patient or surrogate, ordering and performing treatments and interventions, ordering and review of laboratory studies, ordering and review of radiographic studies, pulse oximetry and re-evaluation of patient's condition.  Nanda Quinton, MD Emergency  Medicine  ____________________________________________   INITIAL IMPRESSION / ASSESSMENT AND PLAN / ED COURSE  Pertinent labs & imaging results that were available during my care of the patient were reviewed by me and considered in my medical decision making (see chart for details).  Patient resents to the emergency department for evaluation of being minimally responsive this morning and bradycardia. Patient is being transcutaneously paced at 70 bpm (70 mAmp) with EMS. Patient is grimacing to pain but not following commands. Blood pressure is improved. Pacing was discontinued to assess the underlying rhythm point the initial EKG was obtained showing sinus bradycardia in the 50s to 60s with no evidence of acute ischemia. Patient was given Versed (2mg ) on arrival she began crying out during pacing. The repeat blood pressure off the pacer was in the Q000111Q systolic and pacing was discontinued. Plan for gentle IV fluids, electrolyte evaluation, and troponin.   09:51 AM Patient's blood pressure remains normal. Pulse is around 60. Patient is somnolent but received Versed earlier. Daughter has no concern for head trauma and nothing is evident on exam to suggest this. Patient in no respiratory distress. She is requiring supplemental oxygen. The daughter states that the patient is made very clear that she does not want any CPR or intubation a case that her heart may stop. I have placed a DO NOT RESUSCITATE order in the system. She is followed by cardiology for paroxysmal A. Fib and the daughter states she was told that she is not a good candidate for surgery and had a cardiac arrest during a heart catheterization. The patient's primary cardiologist is Dr. Cathie Olden.   10:30 AM Spoke with Trish from Cardiology. Cardiology will be down to bedside to consider pacemaker in this patient.   Cardiology to admit. Appreciate assistance with case.  ____________________________________________  FINAL CLINICAL  IMPRESSION(S) / ED DIAGNOSES  Final diagnoses:  Bradycardia     MEDICATIONS GIVEN DURING THIS VISIT:  Medications  midazolam (VERSED) 2 MG/2ML injection (not administered)  midazolam (VERSED) injection 2 mg (not administered)  midazolam (VERSED) injection 2.5 mg (2 mg Intravenous Given 04/27/16 0915)  sodium chloride 0.9 % bolus 500 mL (500 mLs Intravenous New Bag/Given 04/27/16 0931)     NEW OUTPATIENT MEDICATIONS STARTED DURING THIS VISIT:  None   Note:  This document was prepared using Dragon voice recognition software and may include unintentional dictation errors.  Nanda Quinton, MD Emergency Medicine   Margette Fast, MD 04/27/16 1125

## 2016-04-28 DIAGNOSIS — R21 Rash and other nonspecific skin eruption: Secondary | ICD-10-CM | POA: Diagnosis present

## 2016-04-28 DIAGNOSIS — R0602 Shortness of breath: Secondary | ICD-10-CM

## 2016-04-28 DIAGNOSIS — R55 Syncope and collapse: Secondary | ICD-10-CM

## 2016-04-28 DIAGNOSIS — E876 Hypokalemia: Secondary | ICD-10-CM | POA: Diagnosis present

## 2016-04-28 DIAGNOSIS — I480 Paroxysmal atrial fibrillation: Secondary | ICD-10-CM

## 2016-04-28 LAB — CBC
HEMATOCRIT: 38.6 % (ref 36.0–46.0)
Hemoglobin: 12.8 g/dL (ref 12.0–15.0)
MCH: 29.6 pg (ref 26.0–34.0)
MCHC: 33.2 g/dL (ref 30.0–36.0)
MCV: 89.4 fL (ref 78.0–100.0)
PLATELETS: 156 10*3/uL (ref 150–400)
RBC: 4.32 MIL/uL (ref 3.87–5.11)
RDW: 13.2 % (ref 11.5–15.5)
WBC: 7.7 10*3/uL (ref 4.0–10.5)

## 2016-04-28 LAB — BASIC METABOLIC PANEL
Anion gap: 9 (ref 5–15)
BUN: 18 mg/dL (ref 6–20)
CO2: 27 mmol/L (ref 22–32)
CREATININE: 0.87 mg/dL (ref 0.44–1.00)
Calcium: 8.7 mg/dL — ABNORMAL LOW (ref 8.9–10.3)
Chloride: 108 mmol/L (ref 101–111)
GFR calc Af Amer: >60 mL/min (ref >60)
GFR, EST NON AFRICAN AMERICAN: 58 mL/min — AB (ref >60)
GLUCOSE: 96 mg/dL (ref 65–99)
Potassium: 3.2 mmol/L — ABNORMAL LOW (ref 3.5–5.1)
Sodium: 144 mmol/L (ref 135–145)

## 2016-04-28 LAB — URINALYSIS, ROUTINE W REFLEX MICROSCOPIC
Bilirubin Urine: NEGATIVE
GLUCOSE, UA: NEGATIVE mg/dL
HGB URINE DIPSTICK: NEGATIVE
Ketones, ur: NEGATIVE mg/dL
Leukocytes, UA: NEGATIVE
Nitrite: NEGATIVE
PROTEIN: NEGATIVE mg/dL
Specific Gravity, Urine: 1.008 (ref 1.005–1.030)
pH: 7 (ref 5.0–8.0)

## 2016-04-28 LAB — PROTIME-INR
INR: 1.26
Prothrombin Time: 15.9 seconds — ABNORMAL HIGH (ref 11.4–15.2)

## 2016-04-28 MED ORDER — POTASSIUM CHLORIDE CRYS ER 20 MEQ PO TBCR
40.0000 meq | EXTENDED_RELEASE_TABLET | ORAL | Status: AC
  Administered 2016-04-28 (×2): 40 meq via ORAL
  Filled 2016-04-28 (×2): qty 2

## 2016-04-28 MED ORDER — ASPIRIN EC 81 MG PO TBEC
81.0000 mg | DELAYED_RELEASE_TABLET | Freq: Every day | ORAL | Status: DC
  Administered 2016-04-28 – 2016-04-29 (×2): 81 mg via ORAL
  Filled 2016-04-28 (×2): qty 1

## 2016-04-28 MED ORDER — POTASSIUM CHLORIDE CRYS ER 20 MEQ PO TBCR
40.0000 meq | EXTENDED_RELEASE_TABLET | Freq: Every day | ORAL | Status: DC
  Administered 2016-04-29: 40 meq via ORAL
  Filled 2016-04-28: qty 2
  Filled 2016-04-28: qty 4

## 2016-04-28 MED ORDER — HYDROCORTISONE 1 % EX LOTN: TOPICAL_LOTION | Freq: Two times a day (BID) | CUTANEOUS | Status: DC

## 2016-04-28 MED ORDER — FUROSEMIDE 40 MG PO TABS
40.0000 mg | ORAL_TABLET | Freq: Two times a day (BID) | ORAL | Status: DC
  Administered 2016-04-29: 40 mg via ORAL
  Filled 2016-04-28: qty 1

## 2016-04-28 MED ORDER — PANTOPRAZOLE SODIUM 40 MG PO TBEC
40.0000 mg | DELAYED_RELEASE_TABLET | Freq: Every day | ORAL | Status: DC
  Administered 2016-04-28 – 2016-04-29 (×2): 40 mg via ORAL
  Filled 2016-04-28 (×2): qty 1

## 2016-04-28 MED ORDER — HYDROCORTISONE 1 % EX CREA
TOPICAL_CREAM | Freq: Two times a day (BID) | CUTANEOUS | Status: DC
  Administered 2016-04-28: 1 via TOPICAL
  Administered 2016-04-28 – 2016-04-29 (×2): via TOPICAL
  Filled 2016-04-28: qty 28

## 2016-04-28 MED ORDER — ORAL CARE MOUTH RINSE
15.0000 mL | Freq: Two times a day (BID) | OROMUCOSAL | Status: DC
  Administered 2016-04-28 (×2): 15 mL via OROMUCOSAL

## 2016-04-28 MED ORDER — FUROSEMIDE 40 MG PO TABS: 40.0000 mg | ORAL_TABLET | Freq: Two times a day (BID) | ORAL | Status: DC

## 2016-04-28 NOTE — Progress Notes (Signed)
DAILY PROGRESS NOTE  Subjective:  No events overnight. More awake today. HR now in 60-70's. Toprol stopped- has history of PAF - suspect SSS. No fever - not started on antibiotics.  Objective:  Temp:  [97.5 F (36.4 C)-98 F (36.7 C)] 97.9 F (36.6 C) (02/02 0745) Pulse Rate:  [50-75] 66 (02/02 0830) Resp:  [12-20] 16 (02/02 0830) BP: (102-192)/(43-160) 150/52 (02/02 0830) SpO2:  [95 %-100 %] 98 % (02/02 0830) Weight change:   Intake/Output from previous day: 02/01 0701 - 02/02 0700 In: 1000 [I.V.:1000] Out: 350 [Urine:350]  Intake/Output from this shift: Total I/O In: -  Out: 150 [Urine:150]  Medications: No current facility-administered medications on file prior to encounter.    Current Outpatient Prescriptions on File Prior to Encounter  Medication Sig Dispense Refill  . acetaminophen (TYLENOL) 500 MG tablet Take 500 mg by mouth every 4 (four) hours as needed for mild pain.    Marland Kitchen alum & mag hydroxide-simeth (MINTOX) 024-097-35 MG/5ML suspension Take 30 mLs by mouth every 6 (six) hours as needed for indigestion or heartburn.    Marland Kitchen aspirin EC 81 MG tablet Take 81 mg by mouth daily.    . Cranberry 450 MG TABS Take 1 tablet by mouth 2 (two) times daily.    Marland Kitchen estradiol (ESTRACE) 0.1 MG/GM vaginal cream Place 1 Applicatorful vaginally 2 (two) times a week.    . furosemide (LASIX) 40 MG tablet Take 40 mg by mouth 2 (two) times daily.     Marland Kitchen glycopyrrolate (ROBINUL) 1 MG tablet Take 1 mg by mouth every 8 (eight) hours as needed.    Marland Kitchen guaifenesin (ROBITUSSIN) 100 MG/5ML syrup Take 200 mg by mouth every 6 (six) hours as needed for cough.    Marland Kitchen ibuprofen (ADVIL,MOTRIN) 200 MG tablet Take 200 mg by mouth every 6 (six) hours as needed (back pain). Reported on 06/22/2015    . isosorbide mononitrate (IMDUR) 30 MG 24 hr tablet Take 1 tablet (30 mg total) by mouth daily. 30 tablet 11  . magnesium hydroxide (MILK OF MAGNESIA) 400 MG/5ML suspension Take 30 mLs by mouth at bedtime as needed  for mild constipation.    . mirabegron ER (MYRBETRIQ) 25 MG TB24 tablet Take 25 mg by mouth daily.    Marland Kitchen Neomycin-Bacitracin-Polymyxin (HCA TRIPLE ANTIBIOTIC OINTMENT EX) Apply 1 application topically daily as needed (skintear/abrasions).    . pantoprazole (PROTONIX) 40 MG tablet Take 40 mg by mouth daily.  3  . potassium chloride (K-DUR) 10 MEQ tablet Take 4 tablets (40 mEq total) by mouth daily. 20 tablet 0  . TOPROL XL 50 MG 24 hr tablet Take 50 mg by mouth daily. Name Brand ONLY      Physical Exam: General appearance: alert and no distress Lungs: clear to auscultation bilaterally Heart: regular rate and rhythm Extremities: edema 1+ bilatera, venous stasis dermatitis noted and diffuse red papular rash over legs and face, torso Neurologic: Mental status: Awake, interactive, but not oriented  Lab Results: Results for orders placed or performed during the hospital encounter of 04/27/16 (from the past 48 hour(s))  Comprehensive metabolic panel     Status: Abnormal   Collection Time: 04/27/16  9:36 AM  Result Value Ref Range   Sodium 141 135 - 145 mmol/L   Potassium 3.6 3.5 - 5.1 mmol/L   Chloride 108 101 - 111 mmol/L   CO2 25 22 - 32 mmol/L   Glucose, Bld 135 (H) 65 - 99 mg/dL   BUN 22 (H) 6 -  20 mg/dL   Creatinine, Ser 1.05 (H) 0.44 - 1.00 mg/dL   Calcium 8.6 (L) 8.9 - 10.3 mg/dL   Total Protein 5.9 (L) 6.5 - 8.1 g/dL   Albumin 3.3 (L) 3.5 - 5.0 g/dL   AST 28 15 - 41 U/L   ALT 17 14 - 54 U/L   Alkaline Phosphatase 80 38 - 126 U/L   Total Bilirubin 0.9 0.3 - 1.2 mg/dL   GFR calc non Af Amer 46 (L) >60 mL/min   GFR calc Af Amer 54 (L) >60 mL/min    Comment: (NOTE) The eGFR has been calculated using the CKD EPI equation. This calculation has not been validated in all clinical situations. eGFR's persistently <60 mL/min signify possible Chronic Kidney Disease.    Anion gap 8 5 - 15  Lipase, blood     Status: None   Collection Time: 04/27/16  9:36 AM  Result Value Ref Range    Lipase 37 11 - 51 U/L  Brain natriuretic peptide     Status: Abnormal   Collection Time: 04/27/16  9:36 AM  Result Value Ref Range   B Natriuretic Peptide 250.8 (H) 0.0 - 100.0 pg/mL  CBC with Differential     Status: Abnormal   Collection Time: 04/27/16  9:36 AM  Result Value Ref Range   WBC 4.1 4.0 - 10.5 K/uL   RBC 4.42 3.87 - 5.11 MIL/uL   Hemoglobin 13.0 12.0 - 15.0 g/dL   HCT 39.6 36.0 - 46.0 %   MCV 89.6 78.0 - 100.0 fL   MCH 29.4 26.0 - 34.0 pg   MCHC 32.8 30.0 - 36.0 g/dL   RDW 12.9 11.5 - 15.5 %   Platelets 136 (L) 150 - 400 K/uL   Neutrophils Relative % 68 %   Neutro Abs 2.8 1.7 - 7.7 K/uL   Lymphocytes Relative 22 %   Lymphs Abs 0.9 0.7 - 4.0 K/uL   Monocytes Relative 8 %   Monocytes Absolute 0.3 0.1 - 1.0 K/uL   Eosinophils Relative 2 %   Eosinophils Absolute 0.1 0.0 - 0.7 K/uL   Basophils Relative 0 %   Basophils Absolute 0.0 0.0 - 0.1 K/uL  Magnesium     Status: None   Collection Time: 04/27/16  9:36 AM  Result Value Ref Range   Magnesium 2.4 1.7 - 2.4 mg/dL  I-stat troponin, ED     Status: None   Collection Time: 04/27/16  9:55 AM  Result Value Ref Range   Troponin i, poc 0.00 0.00 - 0.08 ng/mL   Comment 3            Comment: Due to the release kinetics of cTnI, a negative result within the first hours of the onset of symptoms does not rule out myocardial infarction with certainty. If myocardial infarction is still suspected, repeat the test at appropriate intervals.   I-stat Chem 8, ED     Status: Abnormal   Collection Time: 04/27/16  9:57 AM  Result Value Ref Range   Sodium 144 135 - 145 mmol/L   Potassium 3.6 3.5 - 5.1 mmol/L   Chloride 106 101 - 111 mmol/L   BUN 26 (H) 6 - 20 mg/dL   Creatinine, Ser 0.90 0.44 - 1.00 mg/dL   Glucose, Bld 127 (H) 65 - 99 mg/dL   Calcium, Ion 1.11 (L) 1.15 - 1.40 mmol/L   TCO2 27 0 - 100 mmol/L   Hemoglobin 12.9 12.0 - 15.0 g/dL   HCT 38.0 36.0 -  46.0 %  I-Stat CG4 Lactic Acid, ED     Status: None   Collection  Time: 04/27/16  9:58 AM  Result Value Ref Range   Lactic Acid, Venous 1.60 0.5 - 1.9 mmol/L  MRSA PCR Screening     Status: None   Collection Time: 04/27/16  2:33 PM  Result Value Ref Range   MRSA by PCR NEGATIVE NEGATIVE    Comment:        The GeneXpert MRSA Assay (FDA approved for NASAL specimens only), is one component of a comprehensive MRSA colonization surveillance program. It is not intended to diagnose MRSA infection nor to guide or monitor treatment for MRSA infections.   Basic metabolic panel     Status: Abnormal   Collection Time: 04/28/16  5:45 AM  Result Value Ref Range   Sodium 144 135 - 145 mmol/L   Potassium 3.2 (L) 3.5 - 5.1 mmol/L   Chloride 108 101 - 111 mmol/L   CO2 27 22 - 32 mmol/L   Glucose, Bld 96 65 - 99 mg/dL   BUN 18 6 - 20 mg/dL   Creatinine, Ser 0.87 0.44 - 1.00 mg/dL   Calcium 8.7 (L) 8.9 - 10.3 mg/dL   GFR calc non Af Amer 58 (L) >60 mL/min   GFR calc Af Amer >60 >60 mL/min    Comment: (NOTE) The eGFR has been calculated using the CKD EPI equation. This calculation has not been validated in all clinical situations. eGFR's persistently <60 mL/min signify possible Chronic Kidney Disease.    Anion gap 9 5 - 15  CBC     Status: None   Collection Time: 04/28/16  5:45 AM  Result Value Ref Range   WBC 7.7 4.0 - 10.5 K/uL   RBC 4.32 3.87 - 5.11 MIL/uL   Hemoglobin 12.8 12.0 - 15.0 g/dL   HCT 38.6 36.0 - 46.0 %   MCV 89.4 78.0 - 100.0 fL   MCH 29.6 26.0 - 34.0 pg   MCHC 33.2 30.0 - 36.0 g/dL   RDW 13.2 11.5 - 15.5 %   Platelets 156 150 - 400 K/uL  Protime-INR     Status: Abnormal   Collection Time: 04/28/16  5:45 AM  Result Value Ref Range   Prothrombin Time 15.9 (H) 11.4 - 15.2 seconds   INR 1.26     Imaging: Ct Head Wo Contrast  Result Date: 04/27/2016 CLINICAL DATA:  Initial evaluation for acute mental status changes. EXAM: CT HEAD WITHOUT CONTRAST TECHNIQUE: Contiguous axial images were obtained from the base of the skull through  the vertex without intravenous contrast. COMPARISON:  Prior CT from 01/01/2014. FINDINGS: Brain: Diffuse prominence of the CSF containing spaces is compatible with generalized age-related cerebral atrophy. Patchy and confluent hypodensity within the periventricular and deep white matter both cerebral hemispheres most compatible chronic microvascular ischemic disease. Overall, changes are similar to previous. No acute intracranial hemorrhage. No findings to suggest acute large vessel territory infarct. No mass lesion, midline shift or mass effect. Ventricular prominence related to global parenchymal volume loss of hydrocephalus, similar to previous. No extra-axial fluid collection. Vascular: No hyperdense vessel. Scattered vascular calcifications noted within the carotid siphons. Skull: Scalp soft tissues demonstrate no acute abnormality. Calvarium intact. Sinuses/Orbits: Globes and orbital soft tissues within normal limits. Mild scattered mucosal thickening within the ethmoidal air cells. Paranasal sinuses are otherwise clear. No mastoid effusion. IMPRESSION: 1. No acute intracranial process identified. 2. Generalized cerebral atrophy with moderate chronic microvascular ischemic disease, similar to previous.  Electronically Signed   By: Jeannine Boga M.D.   On: 04/27/2016 16:56   Dg Chest Portable 1 View  Result Date: 04/27/2016 CLINICAL DATA:  Patient unresponsive.  Bradycardia. EXAM: PORTABLE CHEST 1 VIEW COMPARISON:  Chest radiograph 01/24/2016. FINDINGS: Pacer pad overlies the patient. Monitoring leads overlie the patient. Low lung volumes. Stable cardiac and mediastinal contours. Aortic atherosclerosis. No large area of pulmonary consolidation. Minimal basilar atelectasis. No pleural effusion or pneumothorax. IMPRESSION: Low lung volumes with basilar atelectasis. Electronically Signed   By: Lovey Newcomer M.D.   On: 04/27/2016 09:57    Assessment:  Principal Problem:   Dyspnea Active Problems:    Paroxysmal atrial fibrillation (HCC)   Near syncope   Bradycardia   Hypokalemia   Rash and nonspecific skin eruption   Plan:  1. No events overnight. Maintaining sinus rhythm in the 60's. No signs of infection - wound on the right shin is scabbed. Diffuse papular red rash on extremities, torso and face - will Rx for hydrocortisone lotion. Continue to hold Toprol XL - monitor for recurrent a-fib. Replete potassium today.   Time Spent Directly with Patient:  15 minutes  Length of Stay:  LOS: 1 day   Pixie Casino, MD, [facility] Attending Cardiologist [place] 04/28/2016, 9:27 AM

## 2016-04-29 DIAGNOSIS — K5900 Constipation, unspecified: Secondary | ICD-10-CM

## 2016-04-29 LAB — BASIC METABOLIC PANEL
ANION GAP: 8 (ref 5–15)
BUN: 17 mg/dL (ref 6–20)
CHLORIDE: 104 mmol/L (ref 101–111)
CO2: 28 mmol/L (ref 22–32)
CREATININE: 0.88 mg/dL (ref 0.44–1.00)
Calcium: 8.8 mg/dL — ABNORMAL LOW (ref 8.9–10.3)
GFR calc non Af Amer: 57 mL/min — ABNORMAL LOW (ref >60)
Glucose, Bld: 82 mg/dL (ref 65–99)
POTASSIUM: 3.9 mmol/L (ref 3.5–5.1)
Sodium: 140 mmol/L (ref 135–145)

## 2016-04-29 MED ORDER — POLYETHYLENE GLYCOL 3350 17 G PO PACK: 17.0000 g | PACK | Freq: Every day | ORAL | Status: DC | PRN

## 2016-04-29 MED ORDER — HYDROCORTISONE 1 % EX CREA: TOPICAL_CREAM | Freq: Two times a day (BID) | CUTANEOUS | 0 refills | Status: AC

## 2016-04-29 MED ORDER — POLYETHYLENE GLYCOL 3350 17 G PO PACK: 17.0000 g | PACK | Freq: Every day | ORAL | 0 refills | Status: DC | PRN

## 2016-04-29 NOTE — Progress Notes (Signed)
Discharge instructions/scripts given to patient and daughter; verbalizes understanding. Patient with no complaints at the current time. Will d/c to car via [place].

## 2016-04-29 NOTE — Progress Notes (Signed)
DAILY PROGRESS NOTE  Subjective:  No events overnight. Telemetry shows sinus rhythm with occasional PAC's. No further bradycardia. Labs stable today. UA negative.   Objective:  Temp:  [97.5 F (36.4 C)-98 F (36.7 C)] 98 F (36.7 C) (02/03 0727) Pulse Rate:  [62-78] 64 (02/03 0800) Resp:  [16-24] 17 (02/03 0800) BP: (120-181)/(50-105) 162/58 (02/03 0800) SpO2:  [93 %-98 %] 97 % (02/03 0800) Weight change:   Intake/Output from previous day: 02/02 0701 - 02/03 0700 In: 600 [P.O.:600] Out: 1275 [Urine:1275]  Intake/Output from this shift: Total I/O In: 240 [P.O.:240] Out: -   Medications: No current facility-administered medications on file prior to encounter.    Current Outpatient Prescriptions on File Prior to Encounter  Medication Sig Dispense Refill  . acetaminophen (TYLENOL) 500 MG tablet Take 500 mg by mouth every 4 (four) hours as needed for mild pain.    Marland Kitchen alum & mag hydroxide-simeth (MINTOX) 268-341-96 MG/5ML suspension Take 30 mLs by mouth every 6 (six) hours as needed for indigestion or heartburn.    Marland Kitchen aspirin EC 81 MG tablet Take 81 mg by mouth daily.    . Cranberry 450 MG TABS Take 1 tablet by mouth 2 (two) times daily.    Marland Kitchen estradiol (ESTRACE) 0.1 MG/GM vaginal cream Place 1 Applicatorful vaginally 2 (two) times a week.    . furosemide (LASIX) 40 MG tablet Take 40 mg by mouth 2 (two) times daily.     Marland Kitchen glycopyrrolate (ROBINUL) 1 MG tablet Take 1 mg by mouth every 8 (eight) hours as needed.    Marland Kitchen guaifenesin (ROBITUSSIN) 100 MG/5ML syrup Take 200 mg by mouth every 6 (six) hours as needed for cough.    Marland Kitchen ibuprofen (ADVIL,MOTRIN) 200 MG tablet Take 200 mg by mouth every 6 (six) hours as needed (back pain). Reported on 06/22/2015    . isosorbide mononitrate (IMDUR) 30 MG 24 hr tablet Take 1 tablet (30 mg total) by mouth daily. 30 tablet 11  . magnesium hydroxide (MILK OF MAGNESIA) 400 MG/5ML suspension Take 30 mLs by mouth at bedtime as needed for mild  constipation.    . mirabegron ER (MYRBETRIQ) 25 MG TB24 tablet Take 25 mg by mouth daily.    Marland Kitchen Neomycin-Bacitracin-Polymyxin (HCA TRIPLE ANTIBIOTIC OINTMENT EX) Apply 1 application topically daily as needed (skintear/abrasions).    . pantoprazole (PROTONIX) 40 MG tablet Take 40 mg by mouth daily.  3  . potassium chloride (K-DUR) 10 MEQ tablet Take 4 tablets (40 mEq total) by mouth daily. 20 tablet 0  . TOPROL XL 50 MG 24 hr tablet Take 50 mg by mouth daily. Name Brand ONLY      Physical Exam: General appearance: alert and no distress Lungs: clear to auscultation bilaterally Heart: regular rate and rhythm, S1, S2 normal, no murmur, click, rub or gallop Extremities: extremities normal, atraumatic, no cyanosis or edema and rash has improved Neurologic: Grossly normal  Lab Results: Results for orders placed or performed during the hospital encounter of 04/27/16 (from the past 48 hour(s))  Comprehensive metabolic panel     Status: Abnormal   Collection Time: 04/27/16  9:36 AM  Result Value Ref Range   Sodium 141 135 - 145 mmol/L   Potassium 3.6 3.5 - 5.1 mmol/L   Chloride 108 101 - 111 mmol/L   CO2 25 22 - 32 mmol/L   Glucose, Bld 135 (H) 65 - 99 mg/dL   BUN 22 (H) 6 - 20 mg/dL   Creatinine, Ser 1.05 (H)  0.44 - 1.00 mg/dL   Calcium 8.6 (L) 8.9 - 10.3 mg/dL   Total Protein 5.9 (L) 6.5 - 8.1 g/dL   Albumin 3.3 (L) 3.5 - 5.0 g/dL   AST 28 15 - 41 U/L   ALT 17 14 - 54 U/L   Alkaline Phosphatase 80 38 - 126 U/L   Total Bilirubin 0.9 0.3 - 1.2 mg/dL   GFR calc non Af Amer 46 (L) >60 mL/min   GFR calc Af Amer 54 (L) >60 mL/min    Comment: (NOTE) The eGFR has been calculated using the CKD EPI equation. This calculation has not been validated in all clinical situations. eGFR's persistently <60 mL/min signify possible Chronic Kidney Disease.    Anion gap 8 5 - 15  Lipase, blood     Status: None   Collection Time: 04/27/16  9:36 AM  Result Value Ref Range   Lipase 37 11 - 51 U/L  Brain  natriuretic peptide     Status: Abnormal   Collection Time: 04/27/16  9:36 AM  Result Value Ref Range   B Natriuretic Peptide 250.8 (H) 0.0 - 100.0 pg/mL  CBC with Differential     Status: Abnormal   Collection Time: 04/27/16  9:36 AM  Result Value Ref Range   WBC 4.1 4.0 - 10.5 K/uL   RBC 4.42 3.87 - 5.11 MIL/uL   Hemoglobin 13.0 12.0 - 15.0 g/dL   HCT 39.6 36.0 - 46.0 %   MCV 89.6 78.0 - 100.0 fL   MCH 29.4 26.0 - 34.0 pg   MCHC 32.8 30.0 - 36.0 g/dL   RDW 12.9 11.5 - 15.5 %   Platelets 136 (L) 150 - 400 K/uL   Neutrophils Relative % 68 %   Neutro Abs 2.8 1.7 - 7.7 K/uL   Lymphocytes Relative 22 %   Lymphs Abs 0.9 0.7 - 4.0 K/uL   Monocytes Relative 8 %   Monocytes Absolute 0.3 0.1 - 1.0 K/uL   Eosinophils Relative 2 %   Eosinophils Absolute 0.1 0.0 - 0.7 K/uL   Basophils Relative 0 %   Basophils Absolute 0.0 0.0 - 0.1 K/uL  Magnesium     Status: None   Collection Time: 04/27/16  9:36 AM  Result Value Ref Range   Magnesium 2.4 1.7 - 2.4 mg/dL  I-stat troponin, ED     Status: None   Collection Time: 04/27/16  9:55 AM  Result Value Ref Range   Troponin i, poc 0.00 0.00 - 0.08 ng/mL   Comment 3            Comment: Due to the release kinetics of cTnI, a negative result within the first hours of the onset of symptoms does not rule out myocardial infarction with certainty. If myocardial infarction is still suspected, repeat the test at appropriate intervals.   I-stat Chem 8, ED     Status: Abnormal   Collection Time: 04/27/16  9:57 AM  Result Value Ref Range   Sodium 144 135 - 145 mmol/L   Potassium 3.6 3.5 - 5.1 mmol/L   Chloride 106 101 - 111 mmol/L   BUN 26 (H) 6 - 20 mg/dL   Creatinine, Ser 0.90 0.44 - 1.00 mg/dL   Glucose, Bld 127 (H) 65 - 99 mg/dL   Calcium, Ion 1.11 (L) 1.15 - 1.40 mmol/L   TCO2 27 0 - 100 mmol/L   Hemoglobin 12.9 12.0 - 15.0 g/dL   HCT 38.0 36.0 - 46.0 %  I-Stat CG4 Lactic Acid,  ED     Status: None   Collection Time: 04/27/16  9:58 AM    Result Value Ref Range   Lactic Acid, Venous 1.60 0.5 - 1.9 mmol/L  MRSA PCR Screening     Status: None   Collection Time: 04/27/16  2:33 PM  Result Value Ref Range   MRSA by PCR NEGATIVE NEGATIVE    Comment:        The GeneXpert MRSA Assay (FDA approved for NASAL specimens only), is one component of a comprehensive MRSA colonization surveillance program. It is not intended to diagnose MRSA infection nor to guide or monitor treatment for MRSA infections.   Basic metabolic panel     Status: Abnormal   Collection Time: 04/28/16  5:45 AM  Result Value Ref Range   Sodium 144 135 - 145 mmol/L   Potassium 3.2 (L) 3.5 - 5.1 mmol/L   Chloride 108 101 - 111 mmol/L   CO2 27 22 - 32 mmol/L   Glucose, Bld 96 65 - 99 mg/dL   BUN 18 6 - 20 mg/dL   Creatinine, Ser 0.87 0.44 - 1.00 mg/dL   Calcium 8.7 (L) 8.9 - 10.3 mg/dL   GFR calc non Af Amer 58 (L) >60 mL/min   GFR calc Af Amer >60 >60 mL/min    Comment: (NOTE) The eGFR has been calculated using the CKD EPI equation. This calculation has not been validated in all clinical situations. eGFR's persistently <60 mL/min signify possible Chronic Kidney Disease.    Anion gap 9 5 - 15  CBC     Status: None   Collection Time: 04/28/16  5:45 AM  Result Value Ref Range   WBC 7.7 4.0 - 10.5 K/uL   RBC 4.32 3.87 - 5.11 MIL/uL   Hemoglobin 12.8 12.0 - 15.0 g/dL   HCT 38.6 36.0 - 46.0 %   MCV 89.4 78.0 - 100.0 fL   MCH 29.6 26.0 - 34.0 pg   MCHC 33.2 30.0 - 36.0 g/dL   RDW 13.2 11.5 - 15.5 %   Platelets 156 150 - 400 K/uL  Protime-INR     Status: Abnormal   Collection Time: 04/28/16  5:45 AM  Result Value Ref Range   Prothrombin Time 15.9 (H) 11.4 - 15.2 seconds   INR 1.26   Urinalysis, Routine w reflex microscopic     Status: Abnormal   Collection Time: 04/28/16 12:35 PM  Result Value Ref Range   Color, Urine STRAW (A) YELLOW   APPearance CLEAR CLEAR   Specific Gravity, Urine 1.008 1.005 - 1.030   pH 7.0 5.0 - 8.0   Glucose, UA  NEGATIVE NEGATIVE mg/dL   Hgb urine dipstick NEGATIVE NEGATIVE   Bilirubin Urine NEGATIVE NEGATIVE   Ketones, ur NEGATIVE NEGATIVE mg/dL   Protein, ur NEGATIVE NEGATIVE mg/dL   Nitrite NEGATIVE NEGATIVE   Leukocytes, UA NEGATIVE NEGATIVE  Basic metabolic panel     Status: Abnormal   Collection Time: 04/29/16  3:20 AM  Result Value Ref Range   Sodium 140 135 - 145 mmol/L   Potassium 3.9 3.5 - 5.1 mmol/L    Comment: DELTA CHECK NOTED   Chloride 104 101 - 111 mmol/L   CO2 28 22 - 32 mmol/L   Glucose, Bld 82 65 - 99 mg/dL   BUN 17 6 - 20 mg/dL   Creatinine, Ser 0.88 0.44 - 1.00 mg/dL   Calcium 8.8 (L) 8.9 - 10.3 mg/dL   GFR calc non Af Amer 57 (L) >60 mL/min  GFR calc Af Amer >60 >60 mL/min    Comment: (NOTE) The eGFR has been calculated using the CKD EPI equation. This calculation has not been validated in all clinical situations. eGFR's persistently <60 mL/min signify possible Chronic Kidney Disease.    Anion gap 8 5 - 15    Imaging: Ct Head Wo Contrast  Result Date: 04/27/2016 CLINICAL DATA:  Initial evaluation for acute mental status changes. EXAM: CT HEAD WITHOUT CONTRAST TECHNIQUE: Contiguous axial images were obtained from the base of the skull through the vertex without intravenous contrast. COMPARISON:  Prior CT from 01/01/2014. FINDINGS: Brain: Diffuse prominence of the CSF containing spaces is compatible with generalized age-related cerebral atrophy. Patchy and confluent hypodensity within the periventricular and deep white matter both cerebral hemispheres most compatible chronic microvascular ischemic disease. Overall, changes are similar to previous. No acute intracranial hemorrhage. No findings to suggest acute large vessel territory infarct. No mass lesion, midline shift or mass effect. Ventricular prominence related to global parenchymal volume loss of hydrocephalus, similar to previous. No extra-axial fluid collection. Vascular: No hyperdense vessel. Scattered vascular  calcifications noted within the carotid siphons. Skull: Scalp soft tissues demonstrate no acute abnormality. Calvarium intact. Sinuses/Orbits: Globes and orbital soft tissues within normal limits. Mild scattered mucosal thickening within the ethmoidal air cells. Paranasal sinuses are otherwise clear. No mastoid effusion. IMPRESSION: 1. No acute intracranial process identified. 2. Generalized cerebral atrophy with moderate chronic microvascular ischemic disease, similar to previous. Electronically Signed   By: Jeannine Boga M.D.   On: 04/27/2016 16:56   Dg Chest Portable 1 View  Result Date: 04/27/2016 CLINICAL DATA:  Patient unresponsive.  Bradycardia. EXAM: PORTABLE CHEST 1 VIEW COMPARISON:  Chest radiograph 01/24/2016. FINDINGS: Pacer pad overlies the patient. Monitoring leads overlie the patient. Low lung volumes. Stable cardiac and mediastinal contours. Aortic atherosclerosis. No large area of pulmonary consolidation. Minimal basilar atelectasis. No pleural effusion or pneumothorax. IMPRESSION: Low lung volumes with basilar atelectasis. Electronically Signed   By: Lovey Newcomer M.D.   On: 04/27/2016 09:57    Assessment:  1. Principal Problem: 2.   Dyspnea 3. Active Problems: 4.   Paroxysmal atrial fibrillation (HCC) 5.   Near syncope 6.   Bradycardia 7.   Hypokalemia 8.   Rash and nonspecific skin eruption 9.   Plan:  1. No events overnight. Maintaining sinus rhythm off b-blocker. Rash improving. Had to be manually disimpacted for constipation. Will provide Rx for miralax PRN. Continue BID topical hydrocortisone cream after discharge for 1 week. Follow-up with Dr. Acie Fredrickson.  Time Spent Directly with Patient:  15 minutes  Length of Stay:  LOS: 2 days   Pixie Casino, MD, [facility] Attending Cardiologist [place] 04/29/2016, 8:50 AM

## 2016-04-29 NOTE — Discharge Summary (Signed)
Discharge Summary    Patient ID: Alexis Winters,  MRN: [id number], DOB/AGE: 26-Sep-1927 83 y.o.  Admit date: 04/27/2016 Discharge date: 04/29/2016  Primary Care Provider: No PCP Per Patient Primary Cardiologist: Dr. Acie Fredrickson  Discharge Diagnoses    Principal Problem:   Dyspnea Active Problems:   Paroxysmal atrial fibrillation (HCC)   Near syncope   Bradycardia   Hypokalemia   Rash and nonspecific skin eruption   Allergies Allergies  Allergen Reactions  . Contrast Media [Iodinated Diagnostic Agents] Other (See Comments)    Cardiac arrest  . Metoprolol Other (See Comments)    Per daughter "causes her heart to race" Takes brand name Toprol XL only  . Pyridium [Phenazopyridine Hcl] Swelling    unknown  . Sulfa Antibiotics     Diagnostic Studies/Procedures   None   History of Present Illness     Alexis Winters is a 83 year old female with a past medical history of PAF, Chronic diastolic CHF, HLD, HTN. She presented with altered mental status and bradycardia 04/27/16.   Alexis Winters lives at a skilled nursing facility in the Alzheimer's unit. She was found this morning slumped over in a chair, unresponsive. Upon EMS arrival, her HR was 36, external pacing was initiated. She became responsive for a few minutes, but again lost responsiveness in the EMS truck. No loss of pulse, no CPR initiated.   Upon arrival to the ED, external pacing pads were turned off and she was noted to be in sinus bradycardia with rates in the 40-50's.   She is on Toprol at home - 50mg  daily. Her last dose was 04/26/16.    She takes care of all her ADL's, gets up every morning and dresses and applies make up. She is active at her living facility. She is forgetful and does tend to repeat herself according to family.   Hospital Course     Consultants: None  1. Severe bradycardia: HR in 30's upon EMS arrival. She did not required external pacing. Rate stabilized to 60-70s with holding BB and become  more alert and awake. Telemetry shows sinus rhythm with occasional PAC's. No further bradycardia.  She is a DNR and her family seems respectful of her wishes. She is functional at baseline, still takes care of all her ADL's.   2. History of chronic diastolic CHF: She had marked 3+ pretibial edema on admission.  Likely her edema is chronic in nature as her BNP is only 250. Chest X ray without significant edema.   3. Rash on  RLE: Patient has an abrasion on her RLE from bumping into someone else's wheelchair. No signs of infection - wound on the right shin is scabbed. Diffuse papular red rash on extremities, torso and face. Rash improved on hydrocortisone. Will continue for 1 weeks post discharge. F/u with PCP if no improvement.      The patient has been seen by Dr. Debara Pickett today and deemed ready for discharge home. All follow-up appointments have been scheduled. Discharge medications are listed below.  _____________   Discharge Vitals Blood pressure (!) 130/57, pulse 73, temperature 98 F (36.7 C), temperature source Oral, resp. rate (!) 24, height 4\' 11"  (1.499 m), weight 132 lb (59.9 kg), SpO2 97 %.  Filed Weights   04/27/16 0926  Weight: 132 lb (59.9 kg)    Labs & Radiologic Studies     CBC  Recent Labs  04/27/16 0936 04/27/16 0957 04/28/16 0545  WBC 4.1  --  7.7  NEUTROABS 2.8  --   --   HGB 13.0 12.9 12.8  HCT 39.6 38.0 38.6  MCV 89.6  --  89.4  PLT 136*  --  [id number]   Basic Metabolic Panel  Recent Labs  04/27/16 0936  04/28/16 0545 04/29/16 0320  NA 141  < > 144 140  K 3.6  < > 3.2* 3.9  CL 108  < > 108 104  CO2 25  --  27 28  GLUCOSE 135*  < > 96 82  BUN 22*  < > 18 17  CREATININE 1.05*  < > 0.87 0.88  CALCIUM 8.6*  --  8.7* 8.8*  MG 2.4  --   --   --   < > = values in this interval not displayed. Liver Function Tests  Recent Labs  04/27/16 0936  AST 28  ALT 17  ALKPHOS 80  BILITOT 0.9  PROT 5.9*  ALBUMIN 3.3*    Recent Labs  04/27/16 0936    LIPASE 37   Cardiac Enzymes No results for input(s): CKTOTAL, CKMB, CKMBINDEX, TROPONINI in the last 72 hours. BNP Invalid input(s): POCBNP D-Dimer No results for input(s): DDIMER in the last 72 hours. Hemoglobin A1C No results for input(s): HGBA1C in the last 72 hours. Fasting Lipid Panel No results for input(s): CHOL, HDL, LDLCALC, TRIG, CHOLHDL, LDLDIRECT in the last 72 hours. Thyroid Function Tests No results for input(s): TSH, T4TOTAL, T3FREE, THYROIDAB in the last 72 hours.  Invalid input(s): FREET3  Ct Head Wo Contrast  Result Date: 04/27/2016 CLINICAL DATA:  Initial evaluation for acute mental status changes. EXAM: CT HEAD WITHOUT CONTRAST TECHNIQUE: Contiguous axial images were obtained from the base of the skull through the vertex without intravenous contrast. COMPARISON:  Prior CT from 01/01/2014. FINDINGS: Brain: Diffuse prominence of the CSF containing spaces is compatible with generalized age-related cerebral atrophy. Patchy and confluent hypodensity within the periventricular and deep white matter both cerebral hemispheres most compatible chronic microvascular ischemic disease. Overall, changes are similar to previous. No acute intracranial hemorrhage. No findings to suggest acute large vessel territory infarct. No mass lesion, midline shift or mass effect. Ventricular prominence related to global parenchymal volume loss of hydrocephalus, similar to previous. No extra-axial fluid collection. Vascular: No hyperdense vessel. Scattered vascular calcifications noted within the carotid siphons. Skull: Scalp soft tissues demonstrate no acute abnormality. Calvarium intact. Sinuses/Orbits: Globes and orbital soft tissues within normal limits. Mild scattered mucosal thickening within the ethmoidal air cells. Paranasal sinuses are otherwise clear. No mastoid effusion. IMPRESSION: 1. No acute intracranial process identified. 2. Generalized cerebral atrophy with moderate chronic microvascular  ischemic disease, similar to previous. Electronically Signed   By: Jeannine Boga M.D.   On: 04/27/2016 16:56   Dg Chest Portable 1 View  Result Date: 04/27/2016 CLINICAL DATA:  Patient unresponsive.  Bradycardia. EXAM: PORTABLE CHEST 1 VIEW COMPARISON:  Chest radiograph 01/24/2016. FINDINGS: Pacer pad overlies the patient. Monitoring leads overlie the patient. Low lung volumes. Stable cardiac and mediastinal contours. Aortic atherosclerosis. No large area of pulmonary consolidation. Minimal basilar atelectasis. No pleural effusion or pneumothorax. IMPRESSION: Low lung volumes with basilar atelectasis. Electronically Signed   By: Lovey Newcomer M.D.   On: 04/27/2016 09:57    Disposition   Pt is being discharged home today in good condition.  Follow-up Plans & Appointments    Follow-up Information    Mertie Moores, MD Follow up.   Specialty:  Cardiology Why:  office will call with time and date Contact information:  [address] [place] [address] [phone]        PCP Follow up.   Why:  follow with PCP if no improvement of rash on legs         Discharge Instructions    Diet - low sodium heart healthy    Complete by:  As directed    Increase activity slowly    Complete by:  As directed       Discharge Medications   Current Discharge Medication List    START taking these medications   Details  hydrocortisone cream 1 % Apply topically 2 (two) times daily. Applied to affect area Qty: 30 g, Refills: 0    polyethylene glycol (MIRALAX / GLYCOLAX) packet Take 17 g by mouth daily as needed for moderate constipation. Qty: 14 each, Refills: 0      CONTINUE these medications which have NOT CHANGED   Details  acetaminophen (TYLENOL) 500 MG tablet Take 500 mg by mouth every 4 (four) hours as needed for mild pain.    alum & mag hydroxide-simeth (MINTOX) [id number] MG/5ML suspension Take 30 mLs by mouth every 6 (six) hours as needed for indigestion or  heartburn.    aspirin EC 81 MG tablet Take 81 mg by mouth daily.    Cranberry 450 MG TABS Take 1 tablet by mouth 2 (two) times daily.    estradiol (ESTRACE) 0.1 MG/GM vaginal cream Place 1 Applicatorful vaginally 2 (two) times a week.    furosemide (LASIX) 40 MG tablet Take 40 mg by mouth 2 (two) times daily.     glycopyrrolate (ROBINUL) 1 MG tablet Take 1 mg by mouth every 8 (eight) hours as needed.    guaifenesin (ROBITUSSIN) 100 MG/5ML syrup Take 200 mg by mouth every 6 (six) hours as needed for cough.    ibuprofen (ADVIL,MOTRIN) 200 MG tablet Take 200 mg by mouth every 6 (six) hours as needed (back pain). Reported on 06/22/2015    isosorbide mononitrate (IMDUR) 30 MG 24 hr tablet Take 1 tablet (30 mg total) by mouth daily. Qty: 30 tablet, Refills: 11    loperamide (IMODIUM) 2 MG capsule Take 2 mg by mouth as needed for diarrhea or loose stools.    magnesium hydroxide (MILK OF MAGNESIA) 400 MG/5ML suspension Take 30 mLs by mouth at bedtime as needed for mild constipation.    mirabegron ER (MYRBETRIQ) 25 MG TB24 tablet Take 25 mg by mouth daily.    Neomycin-Bacitracin-Polymyxin (HCA TRIPLE ANTIBIOTIC OINTMENT EX) Apply 1 application topically daily as needed (skintear/abrasions).    pantoprazole (PROTONIX) 40 MG tablet Take 40 mg by mouth daily. Refills: 3    potassium chloride (K-DUR) 10 MEQ tablet Take 4 tablets (40 mEq total) by mouth daily. Qty: 20 tablet, Refills: 0      STOP taking these medications     TOPROL XL 50 MG 24 hr tablet           Outstanding Labs/Studies   None  Duration of Discharge Encounter   Greater than 30 minutes including physician time.  Signed, Ashly Yepez PA-C 04/29/2016, 10:47 AM

## 2016-04-30 LAB — URINE CULTURE: Culture: 40000 — AB

## 2016-05-04 ENCOUNTER — Telehealth: Payer: Self-pay | Admitting: Cardiovascular Disease

## 2016-05-04 NOTE — Telephone Encounter (Signed)
-----   Message from Jettie Booze, MD sent at 05/02/2016  4:02 PM EST ----- Result from her hospital stay.  Please send to PCP.

## 2016-05-04 NOTE — Telephone Encounter (Signed)
New Message  Pt daughter states she received a call. Please call back to discuss

## 2016-05-04 NOTE — Telephone Encounter (Signed)
Urine culture results reviewed with patient's daughter, Andrey Campanile, who requests results go to [facility] due to patient's complicated hx of multiple UTIs. I have routed results to Jimmey Ralph, NP and  Dr. Gaynelle Arabian per daughter's request.

## 2016-05-07 ENCOUNTER — Emergency Department (HOSPITAL_COMMUNITY): Payer: Medicare Other

## 2016-05-07 ENCOUNTER — Emergency Department (HOSPITAL_COMMUNITY)
Admission: EM | Admit: 2016-05-07 | Discharge: 2016-05-07 | Disposition: A | Discharge status: Home / Self Care | Payer: Medicare Other | Attending: Emergency Medicine | Admitting: Emergency Medicine

## 2016-05-07 ENCOUNTER — Encounter (HOSPITAL_COMMUNITY): Payer: Self-pay | Admitting: *Deleted

## 2016-05-07 DIAGNOSIS — I5032 Chronic diastolic (congestive) heart failure: Secondary | ICD-10-CM | POA: Diagnosis not present

## 2016-05-07 DIAGNOSIS — K449 Diaphragmatic hernia without obstruction or gangrene: Secondary | ICD-10-CM | POA: Diagnosis not present

## 2016-05-07 DIAGNOSIS — Z853 Personal history of malignant neoplasm of breast: Secondary | ICD-10-CM | POA: Diagnosis not present

## 2016-05-07 DIAGNOSIS — Z87891 Personal history of nicotine dependence: Secondary | ICD-10-CM | POA: Diagnosis not present

## 2016-05-07 DIAGNOSIS — Z79899 Other long term (current) drug therapy: Secondary | ICD-10-CM | POA: Diagnosis not present

## 2016-05-07 DIAGNOSIS — Z7982 Long term (current) use of aspirin: Secondary | ICD-10-CM | POA: Diagnosis not present

## 2016-05-07 DIAGNOSIS — I110 Hypertensive heart disease with heart failure: Secondary | ICD-10-CM | POA: Diagnosis not present

## 2016-05-07 DIAGNOSIS — R0789 Other chest pain: Secondary | ICD-10-CM | POA: Diagnosis present

## 2016-05-07 DIAGNOSIS — R079 Chest pain, unspecified: Secondary | ICD-10-CM

## 2016-05-07 HISTORY — DX: Orthostatic hypotension: I95.1

## 2016-05-07 HISTORY — DX: Unspecified dementia, unspecified severity, without behavioral disturbance, psychotic disturbance, mood disturbance, and anxiety: F03.90

## 2016-05-07 LAB — CBC
HEMATOCRIT: 42.1 % (ref 36.0–46.0)
Hemoglobin: 14 g/dL (ref 12.0–15.0)
MCH: 29.3 pg (ref 26.0–34.0)
MCHC: 33.3 g/dL (ref 30.0–36.0)
MCV: 88.1 fL (ref 78.0–100.0)
PLATELETS: 158 10*3/uL (ref 150–400)
RBC: 4.78 MIL/uL (ref 3.87–5.11)
RDW: 12.9 % (ref 11.5–15.5)
WBC: 5.8 10*3/uL (ref 4.0–10.5)

## 2016-05-07 LAB — BASIC METABOLIC PANEL
Anion gap: 9 (ref 5–15)
BUN: 18 mg/dL (ref 6–20)
CHLORIDE: 105 mmol/L (ref 101–111)
CO2: 27 mmol/L (ref 22–32)
Calcium: 9.1 mg/dL (ref 8.9–10.3)
Creatinine, Ser: 0.94 mg/dL (ref 0.44–1.00)
GFR calc Af Amer: >60 mL/min (ref >60)
GFR calc non Af Amer: 53 mL/min — ABNORMAL LOW (ref >60)
GLUCOSE: 100 mg/dL — AB (ref 65–99)
POTASSIUM: 3.7 mmol/L (ref 3.5–5.1)
Sodium: 141 mmol/L (ref 135–145)

## 2016-05-07 LAB — I-STAT TROPONIN, ED
Troponin i, poc: 0 ng/mL (ref 0.00–0.08)
Troponin i, poc: 0.01 ng/mL (ref 0.00–0.08)

## 2016-05-07 LAB — D-DIMER, QUANTITATIVE: D-Dimer, Quant: 0.55 ug/mL-FEU — ABNORMAL HIGH (ref 0.00–0.50)

## 2016-05-07 LAB — BRAIN NATRIURETIC PEPTIDE: B NATRIURETIC PEPTIDE 5: 152.2 pg/mL — AB (ref 0.0–100.0)

## 2016-05-07 NOTE — ED Provider Notes (Signed)
[facility] DEPT Provider Note   CSN: [id number] Arrival date & time: 05/07/16  1333     History   Chief Complaint Chief Complaint  Patient presents with  . Chest Pain    HPI Alexis Winters is a 83 y.o. female.  HPI patient presents with sharp chest pain onset this morning after eating lunch. States it lasted just a few moments and then dissipated. Not associated with nausea or vomiting. No shortness of breath. She currently is pain free. Has bilateral lower extremity swelling which is unchanged. No recent fever or chills.  Past Medical History:  Diagnosis Date  . Allergic rhinitis   . Arrhythmia    a. office to call pt on 09/15/13 for 30 day event monitor  . Atrial fibrillation ([place])    a. h/o PAF, in NSR during admission 09/11/13  . Breast cancer ([place])   . Chronic diastolic CHF (congestive heart failure) ([place])    a. echo 09/12/13 EF [id number], grade 1 diastolic dysf, mild LVOT obstr, moderate MR, elevated filling pressure  . Dementia   . Dyslipidemia   . Enlarged liver   . Frequent UTI   . GERD (gastroesophageal reflux disease)   . Hard of hearing   . Hemorrhoids   . Hypertension   . Hypoglycemia   . Mitral regurgitation    a. moderate MR by echo 09/12/2013  . OA (osteoarthritis)    hip and back  . Orthostatic hypotension   . Phlebitis    l leg  . Prediabetes     Patient Active Problem List   Diagnosis Date Noted  . Hypokalemia 04/28/2016  . Rash and nonspecific skin eruption 04/28/2016  . Bradycardia 04/27/2016  . Atrial flutter, paroxysmal ([place]) 06/22/2015  . Near syncope 01/10/2015  . Neck pain on left side 01/10/2015  . Pulmonary nodule, right 01/10/2015  . Leukocytosis 01/10/2015  . Hypocalcemia 01/10/2015  . Dyspnea 01/10/2015  . Chest pain 09/11/2013  . Bilateral neck pain 09/11/2013  . Angina at rest [facility]) 09/11/2013  . Leg edema 09/05/2013  . Failure to thrive 09/05/2013  . Paroxysmal atrial fibrillation ([place]) 09/05/2013  . Chronic diastolic  CHF (congestive heart failure) ([place]) 09/05/2013    Past Surgical History:  Procedure Laterality Date  . ABDOMINAL HYSTERECTOMY    . BLADDER SUSPENSION    . BREAST LUMPECTOMY    . CARDIAC CATHETERIZATION  1980's   [facility]  . FOOT SURGERY     left foot  . INNER EAR SURGERY      OB History    No data available       Home Medications    Prior to Admission medications   Medication Sig Start Date End Date Taking? Authorizing Provider  acetaminophen (TYLENOL) 500 MG tablet Take 500 mg by mouth every 4 (four) hours as needed for mild pain.   Yes Historical Provider, MD  alum & mag hydroxide-simeth (MINTOX) 200-200-20 MG/5ML suspension Take 30 mLs by mouth every 6 (six) hours as needed for indigestion or heartburn.   Yes Historical Provider, MD  aspirin EC 81 MG tablet Take 81 mg by mouth daily.   Yes Historical Provider, MD  Cranberry 450 MG TABS Take 1 tablet by mouth 2 (two) times daily.   Yes Historical Provider, MD  estradiol (ESTRACE) 0.1 MG/GM vaginal cream Place 1 Applicatorful vaginally 2 (two) times a week.   Yes Historical Provider, MD  furosemide (LASIX) 40 MG tablet Take 40 mg by mouth 2 (two) times daily.  04/09/12  Yes Historical Provider, MD  glycopyrrolate (ROBINUL) 1 MG tablet Take 1 mg by mouth every 8 (eight) hours as needed.   Yes Historical Provider, MD  guaifenesin (ROBITUSSIN) 100 MG/5ML syrup Take 200 mg by mouth every 6 (six) hours as needed for cough.   Yes Historical Provider, MD  ibuprofen (ADVIL,MOTRIN) 200 MG tablet Take 200 mg by mouth every 6 (six) hours as needed (back pain). Reported on 06/22/2015   Yes Historical Provider, MD  isosorbide mononitrate (IMDUR) 30 MG 24 hr tablet Take 1 tablet (30 mg total) by mouth daily. 10/13/15  Yes Thayer Headings, MD  loperamide (IMODIUM) 2 MG capsule Take 2 mg by mouth as needed for diarrhea or loose stools.   Yes Historical Provider, MD  magnesium hydroxide (MILK OF MAGNESIA) 400 MG/5ML suspension Take 30 mLs by mouth at  bedtime as needed for mild constipation.   Yes Historical Provider, MD  mirabegron ER (MYRBETRIQ) 25 MG TB24 tablet Take 25 mg by mouth daily.   Yes Historical Provider, MD  Neomycin-Bacitracin-Polymyxin (HCA TRIPLE ANTIBIOTIC OINTMENT EX) Apply 1 application topically daily as needed (skintear/abrasions).   Yes Historical Provider, MD  pantoprazole (PROTONIX) 40 MG tablet Take 40 mg by mouth daily. 10/19/14  Yes Historical Provider, MD  polyethylene glycol (MIRALAX / GLYCOLAX) packet Take 17 g by mouth daily as needed for moderate constipation. 04/29/16  Yes Bhavinkumar Bhagat, PA  potassium chloride (K-DUR) 10 MEQ tablet Take 4 tablets (40 mEq total) by mouth daily. 09/11/15  Yes Leo Grosser, MD    Family History Family History  Problem Relation Age of Onset  . Cerebral palsy Sister   . Alzheimer's disease Brother   . Hypertension Father   . Heart disease Brother     Social History Social History  Substance Use Topics  . Smoking status: Former Research scientist (life sciences)  . Smokeless tobacco: Never Used  . Alcohol use No     Allergies   Contrast media [iodinated diagnostic agents]; Metoprolol; Pyridium [phenazopyridine hcl]; and Sulfa antibiotics   Review of Systems Review of Systems  Constitutional: Negative for chills and fever.  Respiratory: Negative for cough and shortness of breath.   Cardiovascular: Positive for chest pain and leg swelling. Negative for palpitations.  Gastrointestinal: Negative for abdominal pain, constipation, diarrhea, nausea and vomiting.  Musculoskeletal: Negative for arthralgias, joint swelling, myalgias and neck stiffness.  Skin: Negative for rash and wound.  Neurological: Negative for dizziness, weakness, light-headedness, numbness and headaches.     Physical Exam Updated Vital Signs BP 142/55   Pulse 73   Resp 19   SpO2 97%   Physical Exam  Constitutional: She is oriented to person, place, and time. She appears well-developed and well-nourished.  HENT:    Head: Normocephalic and atraumatic.  Mouth/Throat: Oropharynx is clear and moist. No oropharyngeal exudate.  Eyes: EOM are normal. Pupils are equal, round, and reactive to light.  Neck: Normal range of motion. Neck supple.  Cardiovascular: Normal rate and regular rhythm.  Exam reveals no gallop and no friction rub.   No murmur heard. Pulmonary/Chest: Effort normal and breath sounds normal. No respiratory distress. She has no wheezes. She has no rales. She exhibits no tenderness.  Abdominal: Soft. Bowel sounds are normal. There is no tenderness. There is no rebound and no guarding.  Musculoskeletal: Normal range of motion. She exhibits no edema or tenderness.  Neurological: She is alert and oriented to person, place, and time.  Mild confusion. Moves all extremities without deficit. Sensation fully intact.  Skin: Skin is warm and dry. Capillary refill takes less than 2 seconds. No rash noted. No erythema.  Psychiatric: She has a normal mood and affect. Her behavior is normal.  Nursing note and vitals reviewed.    ED Treatments / Results  Labs (all labs ordered are listed, but only abnormal results are displayed) Labs Reviewed  BASIC METABOLIC PANEL - Abnormal; Notable for the following:       Result Value   Glucose, Bld 100 (*)    GFR calc non Af Amer 53 (*)    All other components within normal limits  BRAIN NATRIURETIC PEPTIDE - Abnormal; Notable for the following:    B Natriuretic Peptide 152.2 (*)    All other components within normal limits  D-DIMER, QUANTITATIVE (NOT AT [facility]) - Abnormal; Notable for the following:    D-Dimer, Quant 0.55 (*)    All other components within normal limits  CBC  I-STAT TROPOININ, ED  I-STAT TROPOININ, ED    EKG  EKG Interpretation  Date/Time:  Sunday May 07 2016 13:49:58 EST Ventricular Rate:  83 PR Interval:    QRS Duration: 93 QT Interval:  383 QTC Calculation: 450 R Axis:   36 Text Interpretation:  Sinus rhythm Abnormal R-wave  progression, early transition Confirmed by Lita Mains  MD, Jacion Dismore ([id number]) on 05/07/2016 4:12:30 PM       Radiology No results found.  Procedures Procedures (including critical care time)  Medications Ordered in ED Medications - No data to display   Initial Impression / Assessment and Plan / ED Course  I have reviewed the triage vital signs and the nursing notes.  Pertinent labs & imaging results that were available during my care of the patient were reviewed by me and considered in my medical decision making (see chart for details).    Patient with atypical chest pain. She is pain-free currently. Initial troponin is normal. We'll repeat that anticipate discharge home to follow-up. Signed out to oncoming emergency physician.   Final Clinical Impressions(s) / ED Diagnoses   Final diagnoses:  Chest pain, unspecified type  Hiatal hernia    New Prescriptions Discharge Medication List as of 05/07/2016  8:07 PM       Julianne Rice, MD 05/09/16 [phone]

## 2016-05-07 NOTE — ED Triage Notes (Addendum)
Pt here via [facility] for chest pain.  Pt lives at assisted living facility sitting down and felt acute onset chest pressure.  When [facility] arrived pt hr was 150  And after 250 ns pt converted to ns 1st deg block (ems states likely pt was in that rhythm for 45 min).  PT told staff that this sensation is becoming more frequent.

## 2016-05-28 ENCOUNTER — Observation Stay (HOSPITAL_COMMUNITY)
Admission: EM | Admit: 2016-05-28 | Discharge: 2016-05-29 | Disposition: A | Discharge status: Home / Self Care | Payer: Medicare Other | Attending: Internal Medicine | Admitting: Internal Medicine

## 2016-05-28 ENCOUNTER — Emergency Department (HOSPITAL_COMMUNITY): Payer: Medicare Other

## 2016-05-28 ENCOUNTER — Encounter (HOSPITAL_COMMUNITY): Payer: Self-pay | Admitting: Emergency Medicine

## 2016-05-28 DIAGNOSIS — I4892 Unspecified atrial flutter: Secondary | ICD-10-CM | POA: Diagnosis not present

## 2016-05-28 DIAGNOSIS — I5032 Chronic diastolic (congestive) heart failure: Secondary | ICD-10-CM | POA: Diagnosis not present

## 2016-05-28 DIAGNOSIS — Z853 Personal history of malignant neoplasm of breast: Secondary | ICD-10-CM | POA: Diagnosis not present

## 2016-05-28 DIAGNOSIS — N390 Urinary tract infection, site not specified: Secondary | ICD-10-CM | POA: Diagnosis present

## 2016-05-28 DIAGNOSIS — Z7982 Long term (current) use of aspirin: Secondary | ICD-10-CM | POA: Insufficient documentation

## 2016-05-28 DIAGNOSIS — R7989 Other specified abnormal findings of blood chemistry: Secondary | ICD-10-CM | POA: Insufficient documentation

## 2016-05-28 DIAGNOSIS — I471 Supraventricular tachycardia: Secondary | ICD-10-CM | POA: Diagnosis not present

## 2016-05-28 DIAGNOSIS — R778 Other specified abnormalities of plasma proteins: Secondary | ICD-10-CM

## 2016-05-28 DIAGNOSIS — N3000 Acute cystitis without hematuria: Secondary | ICD-10-CM | POA: Diagnosis not present

## 2016-05-28 DIAGNOSIS — I110 Hypertensive heart disease with heart failure: Secondary | ICD-10-CM | POA: Diagnosis not present

## 2016-05-28 DIAGNOSIS — E876 Hypokalemia: Secondary | ICD-10-CM | POA: Diagnosis not present

## 2016-05-28 DIAGNOSIS — Z87891 Personal history of nicotine dependence: Secondary | ICD-10-CM | POA: Insufficient documentation

## 2016-05-28 DIAGNOSIS — R079 Chest pain, unspecified: Secondary | ICD-10-CM | POA: Diagnosis not present

## 2016-05-28 DIAGNOSIS — Z79899 Other long term (current) drug therapy: Secondary | ICD-10-CM | POA: Insufficient documentation

## 2016-05-28 LAB — CBC WITH DIFFERENTIAL/PLATELET
BASOS ABS: 0 10*3/uL (ref 0.0–0.1)
BASOS PCT: 1 %
Eosinophils Absolute: 0 10*3/uL (ref 0.0–0.7)
Eosinophils Relative: 1 %
HCT: 40.4 % (ref 36.0–46.0)
HEMOGLOBIN: 13.4 g/dL (ref 12.0–15.0)
Lymphocytes Relative: 24 %
Lymphs Abs: 1.4 10*3/uL (ref 0.7–4.0)
MCH: 29.8 pg (ref 26.0–34.0)
MCHC: 33.2 g/dL (ref 30.0–36.0)
MCV: 89.8 fL (ref 78.0–100.0)
Monocytes Absolute: 0.3 10*3/uL (ref 0.1–1.0)
Monocytes Relative: 6 %
NEUTROS ABS: 4 10*3/uL (ref 1.7–7.7)
NEUTROS PCT: 69 %
Platelets: 137 10*3/uL — ABNORMAL LOW (ref 150–400)
RBC: 4.5 MIL/uL (ref 3.87–5.11)
RDW: 13.3 % (ref 11.5–15.5)
WBC: 5.8 10*3/uL (ref 4.0–10.5)

## 2016-05-28 LAB — BASIC METABOLIC PANEL
Anion gap: 7 (ref 5–15)
BUN: 28 mg/dL — ABNORMAL HIGH (ref 6–20)
CALCIUM: 8.6 mg/dL — AB (ref 8.9–10.3)
CO2: 24 mmol/L (ref 22–32)
CREATININE: 0.89 mg/dL (ref 0.44–1.00)
Chloride: 112 mmol/L — ABNORMAL HIGH (ref 101–111)
GFR calc non Af Amer: 57 mL/min — ABNORMAL LOW (ref >60)
GLUCOSE: 79 mg/dL (ref 65–99)
Potassium: 3.8 mmol/L (ref 3.5–5.1)
Sodium: 143 mmol/L (ref 135–145)

## 2016-05-28 LAB — URINALYSIS, ROUTINE W REFLEX MICROSCOPIC
BILIRUBIN URINE: NEGATIVE
Glucose, UA: NEGATIVE mg/dL
Hgb urine dipstick: NEGATIVE
KETONES UR: NEGATIVE mg/dL
NITRITE: NEGATIVE
PH: 7 (ref 5.0–8.0)
Protein, ur: NEGATIVE mg/dL
RBC / HPF: NONE SEEN RBC/hpf (ref 0–5)
Specific Gravity, Urine: 1.006 (ref 1.005–1.030)

## 2016-05-28 LAB — COMPREHENSIVE METABOLIC PANEL
ALBUMIN: 3.4 g/dL — AB (ref 3.5–5.0)
ALK PHOS: 78 U/L (ref 38–126)
ALT: 24 U/L (ref 14–54)
ANION GAP: 13 (ref 5–15)
AST: 35 U/L (ref 15–41)
BUN: 27 mg/dL — AB (ref 6–20)
CO2: 21 mmol/L — AB (ref 22–32)
CREATININE: 1.03 mg/dL — AB (ref 0.44–1.00)
Calcium: 9.2 mg/dL (ref 8.9–10.3)
Chloride: 108 mmol/L (ref 101–111)
GFR calc Af Amer: 55 mL/min — ABNORMAL LOW (ref >60)
GFR calc non Af Amer: 47 mL/min — ABNORMAL LOW (ref >60)
GLUCOSE: 181 mg/dL — AB (ref 65–99)
Potassium: 3.4 mmol/L — ABNORMAL LOW (ref 3.5–5.1)
SODIUM: 142 mmol/L (ref 135–145)
Total Bilirubin: 0.7 mg/dL (ref 0.3–1.2)
Total Protein: 6.4 g/dL — ABNORMAL LOW (ref 6.5–8.1)

## 2016-05-28 LAB — I-STAT TROPONIN, ED
Troponin i, poc: 0 ng/mL (ref 0.00–0.08)
Troponin i, poc: 0.08 ng/mL (ref 0.00–0.08)
Troponin i, poc: 0.09 ng/mL (ref 0.00–0.08)

## 2016-05-28 LAB — PROTIME-INR
INR: 1.24
PROTHROMBIN TIME: 15.7 s — AB (ref 11.4–15.2)

## 2016-05-28 LAB — BRAIN NATRIURETIC PEPTIDE: B Natriuretic Peptide: 336.3 pg/mL — ABNORMAL HIGH (ref 0.0–100.0)

## 2016-05-28 MED ORDER — PANTOPRAZOLE SODIUM 40 MG PO TBEC
40.0000 mg | DELAYED_RELEASE_TABLET | Freq: Every day | ORAL | Status: DC
  Administered 2016-05-29: 40 mg via ORAL
  Filled 2016-05-28: qty 1

## 2016-05-28 MED ORDER — POLYETHYLENE GLYCOL 3350 17 G PO PACK: 17.0000 g | PACK | Freq: Every day | ORAL | Status: DC | PRN

## 2016-05-28 MED ORDER — ACETAMINOPHEN 325 MG PO TABS: 650.0000 mg | ORAL_TABLET | ORAL | Status: DC | PRN

## 2016-05-28 MED ORDER — MORPHINE SULFATE (PF) 2 MG/ML IV SOLN: 2.0000 mg | INTRAVENOUS | Status: DC | PRN

## 2016-05-28 MED ORDER — ONDANSETRON HCL 4 MG/2ML IJ SOLN: 4.0000 mg | Freq: Four times a day (QID) | INTRAMUSCULAR | Status: DC | PRN

## 2016-05-28 MED ORDER — MIRABEGRON ER 25 MG PO TB24
25.0000 mg | ORAL_TABLET | Freq: Every day | ORAL | Status: DC
  Administered 2016-05-29: 25 mg via ORAL
  Filled 2016-05-28: qty 1

## 2016-05-28 MED ORDER — ASPIRIN 325 MG PO TABS
325.0000 mg | ORAL_TABLET | Freq: Once | ORAL | Status: AC
  Administered 2016-05-28: 325 mg via ORAL
  Filled 2016-05-28: qty 1

## 2016-05-28 MED ORDER — ISOSORBIDE MONONITRATE ER 30 MG PO TB24
30.0000 mg | ORAL_TABLET | Freq: Every day | ORAL | Status: DC
  Administered 2016-05-29: 30 mg via ORAL
  Filled 2016-05-28: qty 1

## 2016-05-28 MED ORDER — DILTIAZEM HCL 25 MG/5ML IV SOLN
10.0000 mg | Freq: Once | INTRAVENOUS | Status: DC
  Filled 2016-05-28: qty 5

## 2016-05-28 MED ORDER — ALUM & MAG HYDROXIDE-SIMETH 200-200-20 MG/5ML PO SUSP: 30.0000 mL | Freq: Four times a day (QID) | ORAL | Status: DC | PRN

## 2016-05-28 MED ORDER — ENOXAPARIN SODIUM 30 MG/0.3ML ~~LOC~~ SOLN
30.0000 mg | SUBCUTANEOUS | Status: DC
  Administered 2016-05-29: 30 mg via SUBCUTANEOUS
  Filled 2016-05-28: qty 0.3

## 2016-05-28 MED ORDER — POTASSIUM CHLORIDE ER 10 MEQ PO TBCR: 40.0000 meq | EXTENDED_RELEASE_TABLET | Freq: Every day | ORAL | Status: DC

## 2016-05-28 MED ORDER — DEXTROSE 5 % IV SOLN
1.0000 g | Freq: Once | INTRAVENOUS | Status: AC
  Administered 2016-05-28: 1 g via INTRAVENOUS
  Filled 2016-05-28: qty 10

## 2016-05-28 MED ORDER — ASPIRIN EC 81 MG PO TBEC
81.0000 mg | DELAYED_RELEASE_TABLET | Freq: Every day | ORAL | Status: DC
  Administered 2016-05-29: 81 mg via ORAL
  Filled 2016-05-28: qty 1

## 2016-05-28 NOTE — H&P (Signed)
History and Physical    KORRIN HABEGGER [id number] DOB: 1927/11/06 DOA: 05/28/2016  PCP: No PCP Per Patient Consultants:  [facility] - cardiology Patient coming from: lives at [facility]; [place]: daughter  Chief Complaint: chest pain  HPI: Alexis Winters is a 83 y.o. female with medical history significant of PAF, diastolic CHF, HTN, HLD, dementia presenting with chest pain.  The patient was unaccompanied at the time of my evaluation and so the history was not complete.  She reports that she was walking up the hall after breakfast at her SNF when it started.  Left-sided.  Felt like someone was trying to jerk her heart out of her chest.  It has been feeling better and better since it started improving, but she is unsure how long it lasted . She is not aware of whether medication made it better.   ED Course: Afib with RVR on presentation with h/o tachy-brady syndrome but not a pacer candidate.  +UTI, treated with Rocephin.  Elevated troponin - cardiology consulted but recommends hospitalist admission.  Review of Systems: As per HPI; otherwise 10 point review of systems reviewed and negative.   Ambulatory Status:  ambulates with a walker  Past Medical History:  Diagnosis Date  . Allergic rhinitis   . Arrhythmia    a. office to call pt on 09/15/13 for 30 day event monitor  . Atrial fibrillation ([place])    a. h/o PAF, in NSR during admission 09/11/13  . Breast cancer ([place])   . Chronic diastolic CHF (congestive heart failure) ([place])    a. echo 09/12/13 EF Q000111Q, grade 1 diastolic dysf, mild LVOT obstr, moderate MR, elevated filling pressure  . Dementia   . Dyslipidemia   . Enlarged liver   . Frequent UTI   . GERD (gastroesophageal reflux disease)   . Hard of hearing   . Hemorrhoids   . Hypertension   . Hypoglycemia   . Mitral regurgitation    a. moderate MR by echo 09/12/2013  . OA (osteoarthritis)    hip and back  . Orthostatic hypotension   . Phlebitis    l leg  . Prediabetes      Past Surgical History:  Procedure Laterality Date  . ABDOMINAL HYSTERECTOMY    . BLADDER SUSPENSION    . BREAST LUMPECTOMY    . CARDIAC CATHETERIZATION  1980's   [facility]  . FOOT SURGERY     left foot  . INNER EAR SURGERY      Social History   Social History  . Marital status: Widowed    Spouse name: N/A  . Number of children: N/A  . Years of education: N/A   Occupational History  . Not on file.   Social History Main Topics  . Smoking status: Former Research scientist (life sciences)  . Smokeless tobacco: Never Used  . Alcohol use No  . Drug use: No  . Sexual activity: No   Other Topics Concern  . Not on file   Social History Narrative  . No narrative on file    Allergies  Allergen Reactions  . Contrast Media [Iodinated Diagnostic Agents] Other (See Comments)    Cardiac arrest  . Metoprolol Other (See Comments)    Per daughter "causes her heart to race, syncope" Takes brand name Toprol XL only  . Pyridium [Phenazopyridine Hcl] Swelling  . Sulfa Antibiotics Other (See Comments)    Family History  Problem Relation Age of Onset  . Cerebral palsy Sister   . Alzheimer's disease Brother   .  Hypertension Father   . Heart disease Brother     Prior to Admission medications   Medication Sig Start Date End Date Taking? Authorizing Provider  acetaminophen (TYLENOL) 500 MG tablet Take 500 mg by mouth every 4 (four) hours as needed for mild pain.   Yes Historical Provider, MD  alum & mag hydroxide-simeth (MINTOX) 200-200-20 MG/5ML suspension Take 30 mLs by mouth every 6 (six) hours as needed for indigestion or heartburn.   Yes Historical Provider, MD  aspirin EC 81 MG tablet Take 81 mg by mouth daily.   Yes Historical Provider, MD  Cranberry 450 MG TABS Take 1 tablet by mouth 2 (two) times daily.   Yes Historical Provider, MD  estradiol (ESTRACE) 0.1 MG/GM vaginal cream Place 1 Applicatorful vaginally 2 (two) times a week.   Yes Historical Provider, MD  furosemide (LASIX) 40 MG tablet Take 40 mg  by mouth 2 (two) times daily.  04/09/12  Yes Historical Provider, MD  guaifenesin (ROBITUSSIN) 100 MG/5ML syrup Take 200 mg by mouth every 6 (six) hours as needed for cough.   Yes Historical Provider, MD  ibuprofen (ADVIL,MOTRIN) 200 MG tablet Take 200 mg by mouth every 6 (six) hours as needed (back pain). Reported on 06/22/2015   Yes Historical Provider, MD  isosorbide mononitrate (IMDUR) 30 MG 24 hr tablet Take 1 tablet (30 mg total) by mouth daily. 10/13/15  Yes Thayer Headings, MD  magnesium hydroxide (MILK OF MAGNESIA) 400 MG/5ML suspension Take 30 mLs by mouth at bedtime as needed for mild constipation.   Yes Historical Provider, MD  mirabegron ER (MYRBETRIQ) 25 MG TB24 tablet Take 25 mg by mouth daily.   Yes Historical Provider, MD  pantoprazole (PROTONIX) 40 MG tablet Take 40 mg by mouth daily. 10/19/14  Yes Historical Provider, MD  polyethylene glycol (MIRALAX / GLYCOLAX) packet Take 17 g by mouth daily as needed for moderate constipation. 04/29/16  Yes Bhavinkumar Bhagat, PA  potassium chloride (K-DUR) 10 MEQ tablet Take 4 tablets (40 mEq total) by mouth daily. 09/11/15  Yes Leo Grosser, MD  glycopyrrolate (ROBINUL) 1 MG tablet Take 1 mg by mouth every 8 (eight) hours as needed.    Historical Provider, MD  Neomycin-Bacitracin-Polymyxin (HCA TRIPLE ANTIBIOTIC OINTMENT EX) Apply 1 application topically daily as needed (skintear/abrasions).    Historical Provider, MD    Physical Exam: Vitals:   05/28/16 1240 05/28/16 2004 05/28/16 2100 05/28/16 2148  BP: 129/66 (!) 139/49 135/65 (!) 174/71  Pulse: 73 67 70 65  Resp: (!) 30 23 16 16   Temp:    97.6 F (36.4 C)  TempSrc:    Oral  SpO2: 98% 96% 95% 97%  Weight:    62.9 kg (138 lb 9.6 oz)  Height:    5\' 4"  (1.626 m)     General: Appears calm and comfortable and is NAD Eyes:  PERRL, EOMI, normal lids, iris ENT:  Extremely hard of hearing,normal  lips & tongue, mmm Neck:  no LAD, masses or thyromegaly Cardiovascular:  RRR, no m/r/g. No LE  edema.  Respiratory:  CTA bilaterally, no w/r/r. Normal respiratory effort. Abdomen:  soft, ntnd, NABS Skin:  no rash or induration seen on limited exam Musculoskeletal:  grossly normal tone BUE/BLE, good ROM, no bony abnormality Psychiatric:  grossly normal mood and affect, speech fluent and appropriate, AOx1 Neurologic:  CN 2-12 grossly intact, moves all extremities in coordinated fashion, sensation intact  Labs on Admission: I have personally reviewed following labs and imaging studies  CBC:  Recent Labs Lab 05/28/16 1046  WBC 5.8  NEUTROABS 4.0  HGB 13.4  HCT 40.4  MCV 89.8  PLT 0000000*   Basic Metabolic Panel:  Recent Labs Lab 05/28/16 1046 05/28/16 1531  NA 142 143  K 3.4* 3.8  CL 108 112*  CO2 21* 24  GLUCOSE 181* 79  BUN 27* 28*  CREATININE 1.03* 0.89  CALCIUM 9.2 8.6*   GFR: Estimated Creatinine Clearance: 38.5 mL/min (by C-G formula based on SCr of 0.89 mg/dL). Liver Function Tests:  Recent Labs Lab 05/28/16 1046  AST 35  ALT 24  ALKPHOS 78  BILITOT 0.7  PROT 6.4*  ALBUMIN 3.4*   No results for input(s): LIPASE, AMYLASE in the last 168 hours. No results for input(s): AMMONIA in the last 168 hours. Coagulation Profile:  Recent Labs Lab 05/28/16 1046  INR 1.24   Cardiac Enzymes: No results for input(s): CKTOTAL, CKMB, CKMBINDEX, TROPONINI in the last 168 hours. BNP (last 3 results) No results for input(s): PROBNP in the last 8760 hours. HbA1C: No results for input(s): HGBA1C in the last 72 hours. CBG: No results for input(s): GLUCAP in the last 168 hours. Lipid Profile: No results for input(s): CHOL, HDL, LDLCALC, TRIG, CHOLHDL, LDLDIRECT in the last 72 hours. Thyroid Function Tests: No results for input(s): TSH, T4TOTAL, FREET4, T3FREE, THYROIDAB in the last 72 hours. Anemia Panel: No results for input(s): VITAMINB12, FOLATE, FERRITIN, TIBC, IRON, RETICCTPCT in the last 72 hours. Urine analysis:    Component Value Date/Time    COLORURINE YELLOW 05/28/2016 1108   APPEARANCEUR CLEAR 05/28/2016 1108   LABSPEC 1.006 05/28/2016 1108   PHURINE 7.0 05/28/2016 1108   GLUCOSEU NEGATIVE 05/28/2016 1108   HGBUR NEGATIVE 05/28/2016 1108   [place] 05/28/2016 1108   KETONESUR NEGATIVE 05/28/2016 1108   PROTEINUR NEGATIVE 05/28/2016 1108   UROBILINOGEN 0.2 01/10/2015 1457   NITRITE NEGATIVE 05/28/2016 1108   LEUKOCYTESUR MODERATE (A) 05/28/2016 1108    Creatinine Clearance: Estimated Creatinine Clearance: 38.5 mL/min (by C-G formula based on SCr of 0.89 mg/dL).  Sepsis Labs: @LABRCNTIP (procalcitonin:4,lacticidven:4) ) Recent Results (from the past 240 hour(s))  MRSA PCR Screening     Status: None   Collection Time: 05/28/16 11:05 PM  Result Value Ref Range Status   MRSA by PCR NEGATIVE NEGATIVE Final    Comment:        The GeneXpert MRSA Assay (FDA approved for NASAL specimens only), is one component of a comprehensive MRSA colonization surveillance program. It is not intended to diagnose MRSA infection nor to guide or monitor treatment for MRSA infections.      Radiological Exams on Admission: Dg Chest Port 1 View  Result Date: 05/28/2016 CLINICAL DATA:  Chest pain. EXAM: PORTABLE CHEST 1 VIEW COMPARISON:  Radiographs of May 07, 2016. FINDINGS: The heart size and mediastinal contours are within normal limits. Both lungs are clear. Atherosclerosis of thoracic aorta is noted. No pneumothorax or pleural effusion is noted. Stable hiatal hernia. The visualized skeletal structures are unremarkable. IMPRESSION: Aortic atherosclerosis. Stable hiatal hernia. No acute intracranial abnormality seen. Electronically Signed   By: Marijo Conception, M.D.   On: 05/28/2016 11:39    EKG: Independently reviewed.  Initially afib with rate 150.  Then NSR with rate 82, abnormal R-wave progression with no evidence of acute ischemia  Assessment/Plan Principal Problem:   Chest pain Active Problems:   Chronic  diastolic CHF (congestive heart failure) (HCC)   Atrial flutter, paroxysmal (HCC)   Hypokalemia   UTI (urinary  tract infection)   Chest pain -Patient with left-sided chest pain that may have come on with exertion. -1-2/3 typical symptoms suggestive of noncardiac vs. atypical chest pain.  -CXR unremarkable.   -Initial cardiac troponin negative but it has been increasing. -EKG not indicative of acute ischemia.   -Patient also with other known cardiac issues including tacy-brady syndrome and CHF.  She was previously determined to not be a candidate for further intervention.  -BNP 336.3, 152.2 on 2/11; she denies SOB and does not appear to be volume overloaded. -Cardiology saw her in the ER and suggests hospitalist admission for medical optimization and treatment of UTI as well as day team cardiology consideration of PPM or AAD for recurrent tachybrady syndrome. -Will plan to place in observation status on telemetry to rule out ACS by overnight observation.  -cycle troponin q6h x 3 and repeat EKG in AM -Continue ASA 81 mg daily -morphine given -She is not taking a statin; uncertain benefit at this point. -Continue Imdur -Risk factor stratification not currently indicated given her age and other comorbidities. -Cardiology consultation in AM for consideration of PPM or AAD -It is not clear why she is taking estradiol, consider cessation. -Glucose 181; there is no indication to cover with SSI as of now -If she is truly not a candidate for PPM or other cardiology intervention, palliative care consultation and possibly hospice would be a reasonable consideration.  This was not addressed with family since they were not present.  UTI -This may be asymptomatic bacteriruia -The patient is unable to provide a sufficient history -UTI would be unlikely to cause her chest pain from earlier today, but given her poor historian abilities, will continue to treat with Rocephin -Urine culture pending -UA:  moderate LE, many bacteria, TNTC WBC   DVT prophylaxis: Lovenox  Code Status: DNR - has goldenrod form Family Communication: None present Disposition Plan: Back to SNF once clinically improved Consults called: Cardiology Admission status: It is my clinical opinion that referral for OBSERVATION is reasonable and necessary in this patient based on the above information provided. The aforementioned taken together are felt to place the patient at high risk for further clinical deterioration. However it is anticipated that the patient may be medically stable for discharge from the hospital within 24 to 48 hours.    Karmen Bongo MD [facility]  If 7PM-7AM, please contact night-coverage [email or web address] Password [facility]  05/29/2016, 12:29 AM

## 2016-05-28 NOTE — ED Notes (Signed)
Pt transported upstairs with her DNR paperwork. Left in room and notified Nurse at desk to inform Hilda Blades.

## 2016-05-28 NOTE — ED Notes (Signed)
Lab called and needs another BMP  Not enough sample

## 2016-05-28 NOTE — ED Triage Notes (Signed)
Patient presents from  [facility]. Patient started having some Chest pain 0900. Patient states epigastric radiating to arm and neck. Patient states she has some Sob. Patient denies any N/V. Patient reate 158.

## 2016-05-28 NOTE — ED Provider Notes (Signed)
[facility] DEPT Provider Note   CSN: [id number] Arrival date & time: 05/28/16  1008     History   Chief Complaint Chief Complaint  Patient presents with  . Chest Pain    HPI Alexis Winters is a 83 y.o. female.  HPI Patient with chest pain and tachycardia after eating lunch shortly before arrival. Denies any shortness of breath. States the chest pain is left-sided radiating up into her neck. She's had similar symptoms in the past. Denies any new lower extremity changes. No recent fever or chills. Seen in emergency department on 05/07/16 for similar symptoms. Past Medical History:  Diagnosis Date  . Allergic rhinitis   . Arrhythmia    a. office to call pt on 09/15/13 for 30 day event monitor  . Atrial fibrillation ([place])    a. h/o PAF, in NSR during admission 09/11/13  . Breast cancer ([place])   . Chronic diastolic CHF (congestive heart failure) ([place])    a. echo 09/12/13 EF Q000111Q, grade 1 diastolic dysf, mild LVOT obstr, moderate MR, elevated filling pressure  . Dementia   . Dyslipidemia   . Enlarged liver   . Frequent UTI   . GERD (gastroesophageal reflux disease)   . Hard of hearing   . Hemorrhoids   . Hypertension   . Hypoglycemia   . Mitral regurgitation    a. moderate MR by echo 09/12/2013  . OA (osteoarthritis)    hip and back  . Orthostatic hypotension   . Phlebitis    l leg  . Prediabetes     Patient Active Problem List   Diagnosis Date Noted  . UTI (urinary tract infection) 05/29/2016  . Hypokalemia 04/28/2016  . Rash and nonspecific skin eruption 04/28/2016  . Bradycardia 04/27/2016  . Atrial flutter, paroxysmal ([place]) 06/22/2015  . Near syncope 01/10/2015  . Neck pain on left side 01/10/2015  . Pulmonary nodule, right 01/10/2015  . Leukocytosis 01/10/2015  . Hypocalcemia 01/10/2015  . Dyspnea 01/10/2015  . Chest pain 09/11/2013  . Bilateral neck pain 09/11/2013  . Angina at rest [facility]) 09/11/2013  . Leg edema 09/05/2013  . Failure to thrive  09/05/2013  . Chronic diastolic CHF (congestive heart failure) ([place]) 09/05/2013    Past Surgical History:  Procedure Laterality Date  . ABDOMINAL HYSTERECTOMY    . BLADDER SUSPENSION    . BREAST LUMPECTOMY    . CARDIAC CATHETERIZATION  1980's   [facility]  . FOOT SURGERY     left foot  . INNER EAR SURGERY      OB History    No data available       Home Medications    Prior to Admission medications   Medication Sig Start Date End Date Taking? Authorizing Provider  acetaminophen (TYLENOL) 500 MG tablet Take 500 mg by mouth every 4 (four) hours as needed for mild pain.   Yes Historical Provider, MD  alum & mag hydroxide-simeth (MINTOX) 200-200-20 MG/5ML suspension Take 30 mLs by mouth every 6 (six) hours as needed for indigestion or heartburn.   Yes Historical Provider, MD  aspirin EC 81 MG tablet Take 81 mg by mouth daily.   Yes Historical Provider, MD  Cranberry 450 MG TABS Take 1 tablet by mouth 2 (two) times daily.   Yes Historical Provider, MD  furosemide (LASIX) 40 MG tablet Take 40 mg by mouth 2 (two) times daily.  04/09/12  Yes Historical Provider, MD  guaifenesin (ROBITUSSIN) 100 MG/5ML syrup Take 200 mg by mouth every 6 (six)  hours as needed for cough.   Yes Historical Provider, MD  ibuprofen (ADVIL,MOTRIN) 200 MG tablet Take 200 mg by mouth every 6 (six) hours as needed (back pain). Reported on 06/22/2015   Yes Historical Provider, MD  isosorbide mononitrate (IMDUR) 30 MG 24 hr tablet Take 1 tablet (30 mg total) by mouth daily. 10/13/15  Yes Thayer Headings, MD  magnesium hydroxide (MILK OF MAGNESIA) 400 MG/5ML suspension Take 30 mLs by mouth at bedtime as needed for mild constipation.   Yes Historical Provider, MD  mirabegron ER (MYRBETRIQ) 25 MG TB24 tablet Take 25 mg by mouth daily.   Yes Historical Provider, MD  pantoprazole (PROTONIX) 40 MG tablet Take 40 mg by mouth daily. 10/19/14  Yes Historical Provider, MD  polyethylene glycol (MIRALAX / GLYCOLAX) packet Take 17 g by mouth  daily as needed for moderate constipation. 04/29/16  Yes Bhavinkumar Bhagat, PA  potassium chloride (K-DUR) 10 MEQ tablet Take 4 tablets (40 mEq total) by mouth daily. 09/11/15  Yes Leo Grosser, MD  cefUROXime (CEFTIN) 500 MG tablet Take 1 tablet (500 mg total) by mouth 2 (two) times daily with a meal. 05/29/16   Geradine Girt, DO  glycopyrrolate (ROBINUL) 1 MG tablet Take 1 mg by mouth every 8 (eight) hours as needed.    Historical Provider, MD  Neomycin-Bacitracin-Polymyxin (HCA TRIPLE ANTIBIOTIC OINTMENT EX) Apply 1 application topically daily as needed (skintear/abrasions).    Historical Provider, MD    Family History Family History  Problem Relation Age of Onset  . Cerebral palsy Sister   . Alzheimer's disease Brother   . Hypertension Father   . Heart disease Brother     Social History Social History  Substance Use Topics  . Smoking status: Former Research scientist (life sciences)  . Smokeless tobacco: Never Used  . Alcohol use No     Allergies   Contrast media [iodinated diagnostic agents]; Metoprolol; Pyridium [phenazopyridine hcl]; and Sulfa antibiotics   Review of Systems Review of Systems  Constitutional: Negative for chills and fever.  Respiratory: Negative for shortness of breath.   Cardiovascular: Positive for chest pain and palpitations. Negative for leg swelling.  Gastrointestinal: Negative for abdominal pain, diarrhea, nausea and vomiting.  Musculoskeletal: Negative for back pain, myalgias, neck pain and neck stiffness.  Skin: Negative for rash and wound.  Neurological: Negative for dizziness, weakness, light-headedness and numbness.  All other systems reviewed and are negative.    Physical Exam Updated Vital Signs BP (!) 128/59   Pulse 69   Temp 97.8 F (36.6 C) (Oral)   Resp 18   Ht 5\' 4"  (1.626 m)   Wt 138 lb 11.2 oz (62.9 kg)   SpO2 99%   BMI 23.81 kg/m   Physical Exam  Constitutional: She is oriented to person, place, and time. She appears well-developed and  well-nourished. No distress.  HENT:  Head: Normocephalic and atraumatic.  Mouth/Throat: Oropharynx is clear and moist.  Eyes: EOM are normal. Pupils are equal, round, and reactive to light.  Neck: Normal range of motion. Neck supple. No JVD present.  Cardiovascular: Regular rhythm.   Tachycardia  Pulmonary/Chest: Effort normal and breath sounds normal. No respiratory distress. She has no wheezes. She has no rales. She exhibits no tenderness.  Abdominal: Soft. Bowel sounds are normal. There is no tenderness. There is no rebound and no guarding.  Musculoskeletal: Normal range of motion. She exhibits edema. She exhibits no tenderness.  Bilateral 3+ pedal edema. Appears chronic in nature. No asymmetry.  Neurological: She is  alert and oriented to person, place, and time.  Hard of hearing. Moving all extremities without deficit. Sensation fully intact.  Skin: Skin is warm and dry. Capillary refill takes less than 2 seconds. No rash noted. No erythema.  Psychiatric: She has a normal mood and affect. Her behavior is normal.  Nursing note and vitals reviewed.    ED Treatments / Results  Labs (all labs ordered are listed, but only abnormal results are displayed) Labs Reviewed  URINE CULTURE - Abnormal; Notable for the following:       Result Value   Culture   (*)    Value: >=100,000 COLONIES/mL PROTEUS MIRABILIS 20,000 COLONIES/mL ESCHERICHIA COLI CULTURE REINCUBATED FOR BETTER GROWTH    Organism ID, Bacteria PROTEUS MIRABILIS (*)    All other components within normal limits  CBC WITH DIFFERENTIAL/PLATELET - Abnormal; Notable for the following:    Platelets 137 (*)    All other components within normal limits  COMPREHENSIVE METABOLIC PANEL - Abnormal; Notable for the following:    Potassium 3.4 (*)    CO2 21 (*)    Glucose, Bld 181 (*)    BUN 27 (*)    Creatinine, Ser 1.03 (*)    Total Protein 6.4 (*)    Albumin 3.4 (*)    GFR calc non Af Amer 47 (*)    GFR calc Af Amer 55 (*)     All other components within normal limits  PROTIME-INR - Abnormal; Notable for the following:    Prothrombin Time 15.7 (*)    All other components within normal limits  URINALYSIS, ROUTINE W REFLEX MICROSCOPIC - Abnormal; Notable for the following:    Leukocytes, UA MODERATE (*)    Bacteria, UA MANY (*)    Squamous Epithelial / LPF 0-5 (*)    All other components within normal limits  BASIC METABOLIC PANEL - Abnormal; Notable for the following:    Chloride 112 (*)    BUN 28 (*)    Calcium 8.6 (*)    GFR calc non Af Amer 57 (*)    All other components within normal limits  BRAIN NATRIURETIC PEPTIDE - Abnormal; Notable for the following:    B Natriuretic Peptide 336.3 (*)    All other components within normal limits  TROPONIN I - Abnormal; Notable for the following:    Troponin I 0.09 (*)    All other components within normal limits  TROPONIN I - Abnormal; Notable for the following:    Troponin I 0.07 (*)    All other components within normal limits  TROPONIN I - Abnormal; Notable for the following:    Troponin I 0.10 (*)    All other components within normal limits  I-STAT TROPOININ, ED - Abnormal; Notable for the following:    Troponin i, poc 0.09 (*)    All other components within normal limits  MRSA PCR SCREENING  I-STAT TROPOININ, ED  I-STAT TROPOININ, ED    EKG  EKG Interpretation  Date/Time:  Sunday May 28 2016 10:31:36 EST Ventricular Rate:  82 PR Interval:    QRS Duration: 94 QT Interval:  355 QTC Calculation: 415 R Axis:   34 Text Interpretation:  Sinus rhythm Probable left atrial enlargement Abnormal R-wave progression, early transition Borderline repolarization abnormality Confirmed by MILLER  MD, BRIAN ([id number]) on 05/29/2016 11:51:25 PM       Radiology No results found.  Procedures Procedures (including critical care time)  Medications Ordered in ED Medications  cefTRIAXone (ROCEPHIN) 1 g in  dextrose 5 % 50 mL IVPB (0 g Intravenous Stopped 05/28/16  1428)  aspirin tablet 325 mg (325 mg Oral Given 05/28/16 2128)  potassium chloride SA (K-DUR,KLOR-CON) CR tablet 40 mEq (40 mEq Oral Given 05/29/16 0438)     Initial Impression / Assessment and Plan / ED Course  I have reviewed the triage vital signs and the nursing notes.  Pertinent labs & imaging results that were available during my care of the patient were reviewed by me and considered in my medical decision making (see chart for details).     Patient spontaneously converted to normal sinus rhythm before any medication given. States her chest pain is now resolved. Initial troponin is normal. Signed out delta troponin to oncoming emergency physician. Patient also has evidence of UTI. Given Rocephin in the emergency department. Final Clinical Impressions(s) / ED Diagnoses   Final diagnoses:  Elevated troponin    New Prescriptions Discharge Medication List as of 05/29/2016  4:13 PM    START taking these medications   Details  cefUROXime (CEFTIN) 500 MG tablet Take 1 tablet (500 mg total) by mouth 2 (two) times daily with a meal., Starting Mon 05/29/2016, No Print         Julianne Rice, MD 05/30/16 [phone]

## 2016-05-28 NOTE — ED Notes (Signed)
MD at bedside. 

## 2016-05-28 NOTE — Consult Note (Signed)
CARDIOLOGY CONSULT NOTE   Referring Physician: [facility] Primary Cardiologist: Dr. Acie Fredrickson Reason for Consultation:  Symptomatic SVT  HPI: 83 yo with hx of dementia, repeated UTIs, HFpEF, and pAF with tachybrady syndrome presents from her nursing facility with chest discomfort, found to be in SVT 155 bpm.  Rhthym spontaneously converted in the ED with resolution of all symptoms.  Work-up in the shows a troponin that reached 0.09, NSR ECG unremarkable. Dirty UA and given cetriaxone.   On chart review, pt admitted 2/1-2/3 to cardiology service for symptomatic bradycardia. Her metoprolol was discontinued and she was discharged on no AVN agents She is DNR, and appears that PPM was not advised. She is not on [facility] for her pAF. On interview, patient is somewhat confused but per baseline per daughter. Daughter answers questions. Patient does deny any active complaints.    Review of Systems:     Cardiac Review of Systems: {Y] = yes [ ]  = no  Chest Pain [ n   ]  Resting SOB [  n ] Exertional SOB  [n  ]  Orthopnea Florencio.Farrier  ]   Pedal Edema Florencio.Farrier   ]    Palpitations Florencio.Farrier  ] Syncope  [n  ]   Presyncope [   ]  General Review of Systems: [Y] = yes [  ]=no Constitional: recent weight change [  ]; anorexia [  ]; fatigue [  ]; nausea [  ]; night sweats [  ]; fever [  ]; or chills [  ];                                                                     Eyes : blurred vision [  ]; diplopia [   ]; vision changes [  ];  Amaurosis fugax[  ]; Resp: cough [  ];  wheezing[  ];  hemoptysis[  ];  PND [  ];  GI:  gallstones[n  ], vomiting[  ];  dysphagia[  ]; melena[  ];  hematochezia [  ]; heartburn[  ];   GU: kidney stones [  ]; hematuria[n  ];   dysuria [n  ];  nocturia[  ]; incontinence [  ];             Skin: rash, swelling[y  ];, hair loss[  ];  peripheral edema[y  ];  or itching[  ]; Musculosketetal: myalgias[  ];  joint swelling[  ];  joint erythema[  ];  joint pain[  ];  back pain[  ];  Heme/Lymph: bruising[  ];   bleeding[ n ];  anemia[  ];  Neuro: TIA[  ];  headaches[n  ];  stroke[ n ];  vertigo[  ];  seizures[n  ];   paresthesias[  ];  difficulty walking[  ];  Psych:depression[n  ]; anxiety[ n ];  Endocrine: diabetes[ n ];  thyroid dysfunction[n  ];  Other:  Past Medical History:  Diagnosis Date  . Allergic rhinitis   . Arrhythmia    a. office to call pt on 09/15/13 for 30 day event monitor  . Atrial fibrillation ([place])    a. h/o PAF, in NSR during admission 09/11/13  . Breast cancer ([place])   . Chronic diastolic CHF (congestive heart failure) (  [place])    a. echo 09/12/13 EF [id number], grade 1 diastolic dysf, mild LVOT obstr, moderate MR, elevated filling pressure  . Dementia   . Dyslipidemia   . Enlarged liver   . Frequent UTI   . GERD (gastroesophageal reflux disease)   . Hard of hearing   . Hemorrhoids   . Hypertension   . Hypoglycemia   . Mitral regurgitation    a. moderate MR by echo 09/12/2013  . OA (osteoarthritis)    hip and back  . Orthostatic hypotension   . Phlebitis    l leg  . Prediabetes     Medications Prior to Admission  Medication Sig Dispense Refill  . acetaminophen (TYLENOL) 500 MG tablet Take 500 mg by mouth every 4 (four) hours as needed for mild pain.    Marland Kitchen alum & mag hydroxide-simeth (MINTOX) [id number] MG/5ML suspension Take 30 mLs by mouth every 6 (six) hours as needed for indigestion or heartburn.    Marland Kitchen aspirin EC 81 MG tablet Take 81 mg by mouth daily.    . Cranberry 450 MG TABS Take 1 tablet by mouth 2 (two) times daily.    Marland Kitchen estradiol (ESTRACE) 0.1 MG/GM vaginal cream Place 1 Applicatorful vaginally 2 (two) times a week.    . furosemide (LASIX) 40 MG tablet Take 40 mg by mouth 2 (two) times daily.     Marland Kitchen guaifenesin (ROBITUSSIN) 100 MG/5ML syrup Take 200 mg by mouth every 6 (six) hours as needed for cough.    Marland Kitchen ibuprofen (ADVIL,MOTRIN) 200 MG tablet Take 200 mg by mouth every 6 (six) hours as needed (back pain). Reported on 06/22/2015    . isosorbide mononitrate  (IMDUR) 30 MG 24 hr tablet Take 1 tablet (30 mg total) by mouth daily. 30 tablet 11  . magnesium hydroxide (MILK OF MAGNESIA) 400 MG/5ML suspension Take 30 mLs by mouth at bedtime as needed for mild constipation.    . mirabegron ER (MYRBETRIQ) 25 MG TB24 tablet Take 25 mg by mouth daily.    . pantoprazole (PROTONIX) 40 MG tablet Take 40 mg by mouth daily.  3  . polyethylene glycol (MIRALAX / GLYCOLAX) packet Take 17 g by mouth daily as needed for moderate constipation. 14 each 0  . potassium chloride (K-DUR) 10 MEQ tablet Take 4 tablets (40 mEq total) by mouth daily. 20 tablet 0  . glycopyrrolate (ROBINUL) 1 MG tablet Take 1 mg by mouth every 8 (eight) hours as needed.    Marland Kitchen Neomycin-Bacitracin-Polymyxin (HCA TRIPLE ANTIBIOTIC OINTMENT EX) Apply 1 application topically daily as needed (skintear/abrasions).      Infusions:   Allergies  Allergen Reactions  . Contrast Media [Iodinated Diagnostic Agents] Other (See Comments)    Cardiac arrest  . Metoprolol Other (See Comments)    Per daughter "causes her heart to race, syncope" Takes brand name Toprol XL only  . Pyridium [Phenazopyridine Hcl] Swelling  . Sulfa Antibiotics Other (See Comments)    Social History   Social History  . Marital status: Widowed    Spouse name: N/A  . Number of children: N/A  . Years of education: N/A   Occupational History  . Not on file.   Social History Main Topics  . Smoking status: Former Research scientist (life sciences)  . Smokeless tobacco: Never Used  . Alcohol use No  . Drug use: No  . Sexual activity: No   Other Topics Concern  . Not on file   Social History Narrative  . No narrative on file  Family History  Problem Relation Age of Onset  . Cerebral palsy Sister   . Alzheimer's disease Brother   . Hypertension Father   . Heart disease Brother     PHYSICAL EXAM: Vitals:   05/28/16 2004 05/28/16 2100  BP: (!) 139/49 135/65  Pulse: 67 70  Resp: 23 16  Temp:      No intake or output data in the 24  hours ending 05/28/16 2155  General:  Chronically ill appearing elderly F. No respiratory difficulty HEENT: normal Neck: supple. no JVD. Carotids 2+ bilat; no bruits. No lymphadenopathy or thryomegaly appreciated. Cor: PMI nondisplaced. Regular rate & rhythm. No rubs, gallops. II/VI systolic murmur best at LLSB Lungs: clear Abdomen: soft, nontender, nondistended. No hepatosplenomegaly. No bruits or masses. Good bowel sounds. Extremities: no cyanosis, clubbing. Mild erythema over R shin. 3+ nonpitting edema Neuro: alert but disoriented cranial nerves grossly intact. moves all 4 extremities w/o difficulty. Affect pleasant.  ECG 05/28/16 #1: narrow complex regular tachycardia 155 bpm, appears short Rp, pseudoR in V1 and pseudoS in inferior leads would favor AVNRT  ECG  05/28/16 #2: NSR, LAA, Qtc 415 ms  Results for orders placed or performed during the hospital encounter of 05/28/16 (from the past 24 hour(s))  CBC with Differential/Platelet     Status: Abnormal   Collection Time: 05/28/16 10:46 AM  Result Value Ref Range   WBC 5.8 4.0 - 10.5 K/uL   RBC 4.50 3.87 - 5.11 MIL/uL   Hemoglobin 13.4 12.0 - 15.0 g/dL   HCT 40.4 36.0 - 46.0 %   MCV 89.8 78.0 - 100.0 fL   MCH 29.8 26.0 - 34.0 pg   MCHC 33.2 30.0 - 36.0 g/dL   RDW 13.3 11.5 - 15.5 %   Platelets 137 (L) 150 - 400 K/uL   Neutrophils Relative % 69 %   Neutro Abs 4.0 1.7 - 7.7 K/uL   Lymphocytes Relative 24 %   Lymphs Abs 1.4 0.7 - 4.0 K/uL   Monocytes Relative 6 %   Monocytes Absolute 0.3 0.1 - 1.0 K/uL   Eosinophils Relative 1 %   Eosinophils Absolute 0.0 0.0 - 0.7 K/uL   Basophils Relative 1 %   Basophils Absolute 0.0 0.0 - 0.1 K/uL  Comprehensive metabolic panel     Status: Abnormal   Collection Time: 05/28/16 10:46 AM  Result Value Ref Range   Sodium 142 135 - 145 mmol/L   Potassium 3.4 (L) 3.5 - 5.1 mmol/L   Chloride 108 101 - 111 mmol/L   CO2 21 (L) 22 - 32 mmol/L   Glucose, Bld 181 (H) 65 - 99 mg/dL   BUN 27 (H) 6 -  20 mg/dL   Creatinine, Ser 1.03 (H) 0.44 - 1.00 mg/dL   Calcium 9.2 8.9 - 10.3 mg/dL   Total Protein 6.4 (L) 6.5 - 8.1 g/dL   Albumin 3.4 (L) 3.5 - 5.0 g/dL   AST 35 15 - 41 U/L   ALT 24 14 - 54 U/L   Alkaline Phosphatase 78 38 - 126 U/L   Total Bilirubin 0.7 0.3 - 1.2 mg/dL   GFR calc non Af Amer 47 (L) >60 mL/min   GFR calc Af Amer 55 (L) >60 mL/min   Anion gap 13 5 - 15  Protime-INR     Status: Abnormal   Collection Time: 05/28/16 10:46 AM  Result Value Ref Range   Prothrombin Time 15.7 (H) 11.4 - 15.2 seconds   INR 1.24   I-stat troponin, ED  Status: None   Collection Time: 05/28/16 10:54 AM  Result Value Ref Range   Troponin i, poc 0.00 0.00 - 0.08 ng/mL   Comment 3          Urinalysis, Routine w reflex microscopic     Status: Abnormal   Collection Time: 05/28/16 11:08 AM  Result Value Ref Range   Color, Urine YELLOW YELLOW   APPearance CLEAR CLEAR   Specific Gravity, Urine 1.006 1.005 - 1.030   pH 7.0 5.0 - 8.0   Glucose, UA NEGATIVE NEGATIVE mg/dL   Hgb urine dipstick NEGATIVE NEGATIVE   Bilirubin Urine NEGATIVE NEGATIVE   Ketones, ur NEGATIVE NEGATIVE mg/dL   Protein, ur NEGATIVE NEGATIVE mg/dL   Nitrite NEGATIVE NEGATIVE   Leukocytes, UA MODERATE (A) NEGATIVE   RBC / HPF NONE SEEN 0 - 5 RBC/hpf   WBC, UA TOO NUMEROUS TO COUNT 0 - 5 WBC/hpf   Bacteria, UA MANY (A) NONE SEEN   Squamous Epithelial / LPF 0-5 (A) NONE SEEN  Basic metabolic panel     Status: Abnormal   Collection Time: 05/28/16  3:31 PM  Result Value Ref Range   Sodium 143 135 - 145 mmol/L   Potassium 3.8 3.5 - 5.1 mmol/L   Chloride 112 (H) 101 - 111 mmol/L   CO2 24 22 - 32 mmol/L   Glucose, Bld 79 65 - 99 mg/dL   BUN 28 (H) 6 - 20 mg/dL   Creatinine, Ser 0.89 0.44 - 1.00 mg/dL   Calcium 8.6 (L) 8.9 - 10.3 mg/dL   GFR calc non Af Amer 57 (L) >60 mL/min   GFR calc Af Amer >60 >60 mL/min   Anion gap 7 5 - 15  Brain natriuretic peptide     Status: Abnormal   Collection Time: 05/28/16  3:31 PM   Result Value Ref Range   B Natriuretic Peptide 336.3 (H) 0.0 - 100.0 pg/mL  I-stat troponin, ED     Status: None   Collection Time: 05/28/16  3:46 PM  Result Value Ref Range   Troponin i, poc 0.08 0.00 - 0.08 ng/mL   Comment 3          I-stat troponin, ED     Status: Abnormal   Collection Time: 05/28/16  6:16 PM  Result Value Ref Range   Troponin i, poc 0.09 (HH) 0.00 - 0.08 ng/mL   Comment NOTIFIED PHYSICIAN    Comment 3           Dg Chest Port 1 View  Result Date: 05/28/2016 CLINICAL DATA:  Chest pain. EXAM: PORTABLE CHEST 1 VIEW COMPARISON:  Radiographs of May 07, 2016. FINDINGS: The heart size and mediastinal contours are within normal limits. Both lungs are clear. Atherosclerosis of thoracic aorta is noted. No pneumothorax or pleural effusion is noted. Stable hiatal hernia. The visualized skeletal structures are unremarkable. IMPRESSION: Aortic atherosclerosis. Stable hiatal hernia. No acute intracranial abnormality seen. Electronically Signed   By: Marijo Conception, M.D.   On: 05/28/2016 11:39   TTE 01/12/15 Study Conclusions  - Left ventricle: The cavity size was normal. There was mild   concentric hypertrophy. Systolic function was vigorous. The   estimated ejection fraction was in the range of 65% to 70%. Wall   motion was normal; there were no regional wall motion   abnormalities. Doppler parameters are consistent with abnormal   left ventricular relaxation (grade 1 diastolic dysfunction).   Doppler parameters are consistent with elevated ventricular   end-diastolic filling  pressure. - Aortic valve: Trileaflet; mildly thickened, mildly calcified   leaflets. - Aortic root: The aortic root was normal in size. - Mitral valve: Calcified annulus. Mildly thickened leaflets .   There was trivial regurgitation. - Left atrium: The atrium was normal in size. - Right ventricle: The cavity size was normal. Wall thickness was   normal. Systolic function was normal. - Right  atrium: The atrium was normal in size. - Tricuspid valve: There was mild regurgitation. - Pulmonary arteries: Systolic pressure was within the normal   range. - Inferior vena cava: The vessel was normal in size. - Pericardium, extracardiac: There was no pericardial effusion.  ASSESSMENT: 83 yo with hx of dementia, repeated UTIs, HFpEF, and pAF with tachybrady syndrome presents from her nursing facility with chest discomfort, found to be in regular narrow complex SVT 155 bpm. Despite her hx, does not appear that this was AF and favor AVNRT.   This is her second hospitalization for tachy or brady issues in past month. The bradycardia has prevented her from receiving AVN agents which would be helpful in preventing symptomatic tachy.    Unclear what the risk/benefit to PPM would be for this patient. Regardless, without addition of AVN agents, I think reasonable likelihood she will re-present with tachycardia in near future, and not safe to start these medications without PPM.   On one hand, PPM is often consistent with palliative care and I think she could "tolerate" the procedure. However, she does have significant comorbidites and recurrent UTI which would increase risk of device infection. Do not think she is good candidate for any AAD since all can cause bradycardia and may be risky in setting of no PPM.   PLAN/DISCUSSION: - admission to hospitalist service for medical optimization and treatment of UTI. - day team cardiology to follow and consider candidacy for PPM or AAD in setting of tachybrady syndrome that has prompted multiple hospitalizations in past month.   Rexanne Mano, MD Cardiology Consultant

## 2016-05-28 NOTE — ED Notes (Signed)
Pt did not need anything at this time  

## 2016-05-28 NOTE — ED Provider Notes (Signed)
  Physical Exam  BP (!) 139/49   Pulse 67   Temp 97.7 F (36.5 C) (Oral)   Resp 23   SpO2 96%   Physical Exam  ED Course  Procedures  MDM Care assumed at 4pm. Patient hx of afib and has tachy brady syndrome. Also has hx of recurrent UTIs. Came in rapid afib that resolved with IVF. UA + UTI, given rocephin. Sign out pending delta trop.   4 pm Delta trop 0.08. No chest pain. Will get another trop around 6 pm.   7:30 pm Trop now 0.09. I called Dr. Carlota Raspberry from cardiology. He will see patient.   8:35 PM Dr. Nyoka Cowden saw patient. Recommend hospitalist admission. She is not a candidate to get pacemaker. Recommend medical management and trend troponin for now. Hospitalist to admit for rule out.     Drenda Freeze, MD 05/28/16 2036

## 2016-05-29 DIAGNOSIS — R079 Chest pain, unspecified: Secondary | ICD-10-CM

## 2016-05-29 DIAGNOSIS — I4892 Unspecified atrial flutter: Secondary | ICD-10-CM

## 2016-05-29 DIAGNOSIS — N3000 Acute cystitis without hematuria: Secondary | ICD-10-CM | POA: Diagnosis not present

## 2016-05-29 DIAGNOSIS — I110 Hypertensive heart disease with heart failure: Secondary | ICD-10-CM | POA: Diagnosis not present

## 2016-05-29 DIAGNOSIS — I5032 Chronic diastolic (congestive) heart failure: Secondary | ICD-10-CM

## 2016-05-29 DIAGNOSIS — R7989 Other specified abnormal findings of blood chemistry: Secondary | ICD-10-CM | POA: Diagnosis not present

## 2016-05-29 DIAGNOSIS — E876 Hypokalemia: Secondary | ICD-10-CM | POA: Diagnosis not present

## 2016-05-29 DIAGNOSIS — N390 Urinary tract infection, site not specified: Secondary | ICD-10-CM | POA: Diagnosis present

## 2016-05-29 LAB — TROPONIN I
TROPONIN I: 0.1 ng/mL — AB (ref <0.03)
Troponin I: 0.09 ng/mL (ref <0.03)
Troponin I: 0.07 ng/mL (ref <0.03)

## 2016-05-29 LAB — MRSA PCR SCREENING: MRSA by PCR: NEGATIVE

## 2016-05-29 MED ORDER — DEXTROSE 5 % IV SOLN
1.0000 g | INTRAVENOUS | Status: DC
  Administered 2016-05-29: 1 g via INTRAVENOUS
  Filled 2016-05-29: qty 10

## 2016-05-29 MED ORDER — POTASSIUM CHLORIDE CRYS ER 20 MEQ PO TBCR
40.0000 meq | EXTENDED_RELEASE_TABLET | Freq: Once | ORAL | Status: AC
  Administered 2016-05-29: 40 meq via ORAL
  Filled 2016-05-29: qty 2

## 2016-05-29 MED ORDER — CEFUROXIME AXETIL 500 MG PO TABS: 500.0000 mg | ORAL_TABLET | Freq: Two times a day (BID) | ORAL | Status: DC

## 2016-05-29 NOTE — Clinical Social Work Note (Signed)
Pt is from [place]. CSW following for disposition needs.  [address], [place]

## 2016-05-29 NOTE — NC FL2 (Signed)
[place] LEVEL OF CARE SCREENING TOOL     IDENTIFICATION  Patient Name: Alexis Winters Birthdate: [birth year] Sex: female Admission Date (Current Location): 05/28/2016  [facility] and [place] Number:  Herbalist and Address:  [facility], [place] [address], [place], [place] [address]      Provider Number: [id number]  Attending Physician Name and Address:  Geradine Girt, DO  Relative Name and Phone Number:       Current Level of Care: Hospital Recommended Level of Care: [place] Prior Approval Number:    Date Approved/Denied:   PASRR Number:  [id number]   Discharge Plan: Other (Comment) (ALF)    Current Diagnoses: Patient Active Problem List   Diagnosis Date Noted  . UTI (urinary tract infection) 05/29/2016  . Hypokalemia 04/28/2016  . Rash and nonspecific skin eruption 04/28/2016  . Bradycardia 04/27/2016  . Atrial flutter, paroxysmal ([place]) 06/22/2015  . Near syncope 01/10/2015  . Neck pain on left side 01/10/2015  . Pulmonary nodule, right 01/10/2015  . Leukocytosis 01/10/2015  . Hypocalcemia 01/10/2015  . Dyspnea 01/10/2015  . Chest pain 09/11/2013  . Bilateral neck pain 09/11/2013  . Angina at rest [facility]) 09/11/2013  . Leg edema 09/05/2013  . Failure to thrive 09/05/2013  . Chronic diastolic CHF (congestive heart failure) ([place]) 09/05/2013    Orientation RESPIRATION BLADDER Height & Weight     Self, Time  Normal Continent Weight: 138 lb 11.2 oz (62.9 kg) Height:  5\' 4"  (162.6 cm)  BEHAVIORAL SYMPTOMS/MOOD NEUROLOGICAL BOWEL NUTRITION STATUS      Continent  (Please see discharge summary)  AMBULATORY STATUS COMMUNICATION OF NEEDS Skin   Limited Assist Verbally Normal                       Personal Care Assistance Level of Assistance  Bathing, Feeding, Dressing Bathing Assistance: Limited assistance Feeding assistance: Independent Dressing Assistance: Limited assistance     Functional  Limitations Info  Sight, Hearing, Speech Sight Info: Adequate Hearing Info: Adequate Speech Info: Adequate    SPECIAL CARE FACTORS FREQUENCY  PT (By licensed PT), OT (By licensed OT)     PT Frequency: 3x week OT Frequency: 3x week            Contractures Contractures Info: Not present    Additional Factors Info  Code Status, Allergies Code Status Info: DNR Allergies Info: Contrast Media Iodinated Diagnostic Agents, Metoprolol, Pyridium Phenazopyridine Hcl, Sulfa Antibiotics           Current Medications (05/29/2016):  This is the current hospital active medication list Current Facility-Administered Medications  Medication Dose Route Frequency Provider Last Rate Last Dose  . acetaminophen (TYLENOL) tablet 650 mg  650 mg Oral Q4H PRN Karmen Bongo, MD      . alum & mag hydroxide-simeth (MAALOX/MYLANTA) 200-200-20 MG/5ML suspension 30 mL  30 mL Oral Q6H PRN Karmen Bongo, MD      . aspirin EC tablet 81 mg  81 mg Oral Daily Karmen Bongo, MD   81 mg at 05/29/16 1024  . cefTRIAXone (ROCEPHIN) 1 g in dextrose 5 % 50 mL IVPB  1 g Intravenous Q24H Erenest Blank, RPH   1 g at 05/29/16 1023  . enoxaparin (LOVENOX) injection 30 mg  30 mg Subcutaneous Q24H Karmen Bongo, MD   30 mg at 05/29/16 1023  . isosorbide mononitrate (IMDUR) 24 hr tablet 30 mg  30 mg Oral Daily Karmen Bongo,  MD   30 mg at 05/29/16 1023  . mirabegron ER (MYRBETRIQ) tablet 25 mg  25 mg Oral Daily Karmen Bongo, MD   25 mg at 05/29/16 1023  . morphine 2 MG/ML injection 2 mg  2 mg Intravenous Q2H PRN Karmen Bongo, MD      . ondansetron [facility]) injection 4 mg  4 mg Intravenous Q6H PRN Karmen Bongo, MD      . pantoprazole (PROTONIX) EC tablet 40 mg  40 mg Oral Daily Karmen Bongo, MD   40 mg at 05/29/16 1023  . polyethylene glycol (MIRALAX / GLYCOLAX) packet 17 g  17 g Oral Daily PRN Karmen Bongo, MD         Discharge Medications: Please see discharge summary for a list of discharge  medications.  Relevant Imaging Results:  Relevant Lab Results:   Additional Information SSN: [id number]  Alla German, LCSW

## 2016-05-29 NOTE — Progress Notes (Signed)
Pharmacy Antibiotic Note  Alexis Winters is a 83 y.o. female admitted on 05/28/2016 with chest pain.  Pharmacy has been consulted for Ceftriaxone dosing for UTI.   Plan: -Ceftriaxone 1g IV q24h -F/U urine culture for directed therapy  Height: 5\' 4"  (162.6 cm) Weight: 138 lb 9.6 oz (62.9 kg) IBW/kg (Calculated) : 54.7  Temp (24hrs), Avg:97.7 F (36.5 C), Min:97.6 F (36.4 C), Max:97.7 F (36.5 C)   Recent Labs Lab 05/28/16 1046 05/28/16 1531  WBC 5.8  --   CREATININE 1.03* 0.89    Estimated Creatinine Clearance: 38.5 mL/min (by C-G formula based on SCr of 0.89 mg/dL).    Allergies  Allergen Reactions  . Contrast Media [Iodinated Diagnostic Agents] Other (See Comments)    Cardiac arrest  . Metoprolol Other (See Comments)    Per daughter "causes her heart to race, syncope" Takes brand name Toprol XL only  . Pyridium [Phenazopyridine Hcl] Swelling  . Sulfa Antibiotics Other (See Comments)    Narda Bonds 05/29/2016 1:14 AM

## 2016-05-29 NOTE — Progress Notes (Signed)
Progress Note  Patient Name: Alexis Winters Date of Encounter: 05/29/2016  Primary Cardiologist: Nahser  Subjective   83 yo with hx of PAF, recurrent UTIs. Admitted wit a UTI and rapid atrial fib. Has been started on ABX. Has converted to NSR    Inpatient Medications    Scheduled Meds: . aspirin EC  81 mg Oral Daily  . cefTRIAXone (ROCEPHIN)  IV  1 g Intravenous Q24H  . enoxaparin (LOVENOX) injection  30 mg Subcutaneous Q24H  . isosorbide mononitrate  30 mg Oral Daily  . mirabegron ER  25 mg Oral Daily  . pantoprazole  40 mg Oral Daily   Continuous Infusions:  PRN Meds: acetaminophen, alum & mag hydroxide-simeth, morphine injection, ondansetron (ZOFRAN) IV, polyethylene glycol   Vital Signs    Vitals:   05/28/16 2004 05/28/16 2100 05/28/16 2148 05/29/16 0500  BP: (!) 139/49 135/65 (!) 174/71 (!) 132/53  Pulse: 67 70 65 66  Resp: 23 16 16 16   Temp:   97.6 F (36.4 C) 97.7 F (36.5 C)  TempSrc:   Oral Oral  SpO2: 96% 95% 97% 97%  Weight:   138 lb 9.6 oz (62.9 kg) 138 lb 11.2 oz (62.9 kg)  Height:   5\' 4"  (1.626 m)     Intake/Output Summary (Last 24 hours) at 05/29/16 0945 Last data filed at 05/29/16 0500  Gross per 24 hour  Intake              480 ml  Output              150 ml  Net              330 ml   Filed Weights   05/28/16 2148 05/29/16 0500  Weight: 138 lb 9.6 oz (62.9 kg) 138 lb 11.2 oz (62.9 kg)    Telemetry    NSR  - Personally Reviewed  ECG    NSR  - Personally Reviewed  Physical Exam   GEN: No acute distress.  Pleasantly demented.  Neck: No JVD Cardiac: RRR, no murmurs, rubs, or gallops.  Respiratory: Clear to auscultation bilaterally. GI: Soft, nontender, non-distended  MS: No edema; No deformity. Neuro:  Nonfocal  Psych: pleasantly demented   Labs    Chemistry Recent Labs Lab 05/28/16 1046 05/28/16 1531  NA 142 143  K 3.4* 3.8  CL 108 112*  CO2 21* 24  GLUCOSE 181* 79  BUN 27* 28*  CREATININE 1.03* 0.89    CALCIUM 9.2 8.6*  PROT 6.4*  --   ALBUMIN 3.4*  --   AST 35  --   ALT 24  --   ALKPHOS 78  --   BILITOT 0.7  --   GFRNONAA 47* 57*  GFRAA 55* >60  ANIONGAP 13 7     Hematology Recent Labs Lab 05/28/16 1046  WBC 5.8  RBC 4.50  HGB 13.4  HCT 40.4  MCV 89.8  MCH 29.8  MCHC 33.2  RDW 13.3  PLT 137*    Cardiac Enzymes Recent Labs Lab 05/28/16 2346 05/29/16 0358  TROPONINI 0.10* 0.09*    Recent Labs Lab 05/28/16 1054 05/28/16 1546 05/28/16 1816  TROPIPOC 0.00 0.08 0.09*     BNP Recent Labs Lab 05/28/16 1531  BNP 336.3*     DDimer No results for input(s): DDIMER in the last 168 hours.   Radiology    Dg Chest Port 1 View  Result Date: 05/28/2016 CLINICAL DATA:  Chest pain. EXAM: PORTABLE CHEST  1 VIEW COMPARISON:  Radiographs of May 07, 2016. FINDINGS: The heart size and mediastinal contours are within normal limits. Both lungs are clear. Atherosclerosis of thoracic aorta is noted. No pneumothorax or pleural effusion is noted. Stable hiatal hernia. The visualized skeletal structures are unremarkable. IMPRESSION: Aortic atherosclerosis. Stable hiatal hernia. No acute intracranial abnormality seen. Electronically Signed   By: Marijo Conception, M.D.   On: 05/28/2016 11:39    Cardiac Studies     Patient Profile     83 yo with hx of dementia, repeated UTIs, HFpEF, and pAF with tachybrady syndrome presents from her nursing facility with chest discomfort, found to be in SVT 155 bpm  Assessment & Plan    1. PAF :   Related to her UTI. She has already converted back to NSR  troponins are minimal and not c/w ACS She does not need any further evaluation of this   She has recurrent UTIs, fairly significant dementia and is not a good  candidate for pacer . I have discussed this with her daughter who understands and agrees with continued medical therapy .   No further cardiac recommendations.  2. UTI :  Plans per Dr. Eliseo Squires.   3. Dementia :  4. Chronic  diastolic CHF : stable   She should be able to go home soon   Signed, Mertie Moores, MD  05/29/2016, 9:45 AM

## 2016-05-29 NOTE — Discharge Summary (Signed)
Physician Discharge Summary  Alexis Winters [id number] DOB: 1928/01/16 DOA: 05/28/2016  PCP: No PCP Per Patient  Admit date: 05/28/2016 Discharge date: 05/29/2016   Recommendations for Outpatient Follow-Up:   ceftin through 3/8 DNR  Not a candidate for pacer Consider palliative care to follow at SNF Urine culture pending  Discharge Diagnosis:   Principal Problem:   Chest pain Active Problems:   Chronic diastolic CHF (congestive heart failure) (HCC)   Atrial flutter, paroxysmal (HCC)   Hypokalemia   UTI (urinary tract infection)   Discharge disposition:  SNF:  Discharge Condition: Improved.  Diet recommendation: Low sodium, heart healthy.    Wound care: None.   History of Present Illness:   Alexis Winters is a 83 y.o. female with medical history significant of PAF, diastolic CHF, HTN, HLD, dementia presenting with chest pain.  The patient was unaccompanied at the time of my evaluation and so the history was not complete.  She reports that she was walking up the hall after breakfast at her SNF when it started.  Left-sided.  Felt like someone was trying to jerk her heart out of her chest.  It has been feeling better and better since it started improving, but she is unsure how long it lasted . She is not aware of whether medication made it better.   ED Course: Afib with RVR on presentation with h/o tachy-brady syndrome but not a pacer candidate.  +UTI, treated with Rocephin.  Elevated troponin - cardiology consulted but recommends hospitalist admission.   Hospital Course by Problem:   1. PAF :   Related to her UTI. She has already converted back to NSR  troponins are minimal and not c/w ACS She does not need any further evaluation of this  not a good  candidate for pacer  Per cards  2. UTI :  treat with ceftin-- follow culture-- recent was sensitive to this  3. Dementia : severe-- in memory care unit  4. Chronic diastolic CHF : stable     Medical  Consultants:    cards   Discharge Exam:   Vitals:   05/28/16 2148 05/29/16 0500  BP: (!) 174/71 (!) 132/53  Pulse: 65 66  Resp: 16 16  Temp: 97.6 F (36.4 C) 97.7 F (36.5 C)   Vitals:   05/28/16 2004 05/28/16 2100 05/28/16 2148 05/29/16 0500  BP: (!) 139/49 135/65 (!) 174/71 (!) 132/53  Pulse: 67 70 65 66  Resp: 23 16 16 16   Temp:   97.6 F (36.4 C) 97.7 F (36.5 C)  TempSrc:   Oral Oral  SpO2: 96% 95% 97% 97%  Weight:   62.9 kg (138 lb 9.6 oz) 62.9 kg (138 lb 11.2 oz)  Height:   5\' 4"  (1.626 m)     Gen:  NAD- hard of hearing   The results of significant diagnostics from this hospitalization (including imaging, microbiology, ancillary and laboratory) are listed below for reference.     Procedures and Diagnostic Studies:   Dg Chest Port 1 View  Result Date: 05/28/2016 CLINICAL DATA:  Chest pain. EXAM: PORTABLE CHEST 1 VIEW COMPARISON:  Radiographs of May 07, 2016. FINDINGS: The heart size and mediastinal contours are within normal limits. Both lungs are clear. Atherosclerosis of thoracic aorta is noted. No pneumothorax or pleural effusion is noted. Stable hiatal hernia. The visualized skeletal structures are unremarkable. IMPRESSION: Aortic atherosclerosis. Stable hiatal hernia. No acute intracranial abnormality seen. Electronically Signed   By: Marijo Conception, M.D.  On: 05/28/2016 11:39     Labs:   Basic Metabolic Panel:  Recent Labs Lab 05/28/16 1046 05/28/16 1531  NA 142 143  K 3.4* 3.8  CL 108 112*  CO2 21* 24  GLUCOSE 181* 79  BUN 27* 28*  CREATININE 1.03* 0.89  CALCIUM 9.2 8.6*   GFR Estimated Creatinine Clearance: 38.5 mL/min (by C-G formula based on SCr of 0.89 mg/dL). Liver Function Tests:  Recent Labs Lab 05/28/16 1046  AST 35  ALT 24  ALKPHOS 78  BILITOT 0.7  PROT 6.4*  ALBUMIN 3.4*   No results for input(s): LIPASE, AMYLASE in the last 168 hours. No results for input(s): AMMONIA in the last 168 hours. Coagulation  profile  Recent Labs Lab 05/28/16 1046  INR 1.24    CBC:  Recent Labs Lab 05/28/16 1046  WBC 5.8  NEUTROABS 4.0  HGB 13.4  HCT 40.4  MCV 89.8  PLT 137*   Cardiac Enzymes:  Recent Labs Lab 05/28/16 2346 05/29/16 0358  TROPONINI 0.10* 0.09*   BNP: Invalid input(s): POCBNP CBG: No results for input(s): GLUCAP in the last 168 hours. D-Dimer No results for input(s): DDIMER in the last 72 hours. Hgb A1c No results for input(s): HGBA1C in the last 72 hours. Lipid Profile No results for input(s): CHOL, HDL, LDLCALC, TRIG, CHOLHDL, LDLDIRECT in the last 72 hours. Thyroid function studies No results for input(s): TSH, T4TOTAL, T3FREE, THYROIDAB in the last 72 hours.  Invalid input(s): FREET3 Anemia work up No results for input(s): VITAMINB12, FOLATE, FERRITIN, TIBC, IRON, RETICCTPCT in the last 72 hours. Microbiology Recent Results (from the past 240 hour(s))  MRSA PCR Screening     Status: None   Collection Time: 05/28/16 11:05 PM  Result Value Ref Range Status   MRSA by PCR NEGATIVE NEGATIVE Final    Comment:        The GeneXpert MRSA Assay (FDA approved for NASAL specimens only), is one component of a comprehensive MRSA colonization surveillance program. It is not intended to diagnose MRSA infection nor to guide or monitor treatment for MRSA infections.      Discharge Instructions:   Discharge Instructions    Diet - low sodium heart healthy    Complete by:  As directed    Increase activity slowly    Complete by:  As directed      Allergies as of 05/29/2016      Reactions   Contrast Media [iodinated Diagnostic Agents] Other (See Comments)   Cardiac arrest   Metoprolol Other (See Comments)   Per daughter "causes her heart to race, syncope" Takes brand name Toprol XL only   Pyridium [phenazopyridine Hcl] Swelling   Sulfa Antibiotics Other (See Comments)      Medication List    STOP taking these medications   estradiol 0.1 MG/GM vaginal  cream Commonly known as:  ESTRACE     TAKE these medications   acetaminophen 500 MG tablet Commonly known as:  TYLENOL Take 500 mg by mouth every 4 (four) hours as needed for mild pain.   aspirin EC 81 MG tablet Take 81 mg by mouth daily.   cefUROXime 500 MG tablet Commonly known as:  CEFTIN Take 1 tablet (500 mg total) by mouth 2 (two) times daily with a meal.   Cranberry 450 MG Tabs Take 1 tablet by mouth 2 (two) times daily.   furosemide 40 MG tablet Commonly known as:  LASIX Take 40 mg by mouth 2 (two) times daily.   glycopyrrolate  1 MG tablet Commonly known as:  ROBINUL Take 1 mg by mouth every 8 (eight) hours as needed.   guaifenesin 100 MG/5ML syrup Commonly known as:  ROBITUSSIN Take 200 mg by mouth every 6 (six) hours as needed for cough.   HCA TRIPLE ANTIBIOTIC OINTMENT EX Apply 1 application topically daily as needed (skintear/abrasions).   ibuprofen 200 MG tablet Commonly known as:  ADVIL,MOTRIN Take 200 mg by mouth every 6 (six) hours as needed (back pain). Reported on 06/22/2015   isosorbide mononitrate 30 MG 24 hr tablet Commonly known as:  IMDUR Take 1 tablet (30 mg total) by mouth daily.   magnesium hydroxide 400 MG/5ML suspension Commonly known as:  MILK OF MAGNESIA Take 30 mLs by mouth at bedtime as needed for mild constipation.   [place] 200-200-20 MG/5ML suspension Generic drug:  alum & mag hydroxide-simeth Take 30 mLs by mouth every 6 (six) hours as needed for indigestion or heartburn.   MYRBETRIQ 25 MG Tb24 tablet Generic drug:  mirabegron ER Take 25 mg by mouth daily.   pantoprazole 40 MG tablet Commonly known as:  PROTONIX Take 40 mg by mouth daily.   polyethylene glycol packet Commonly known as:  MIRALAX / GLYCOLAX Take 17 g by mouth daily as needed for moderate constipation.   potassium chloride 10 MEQ tablet Commonly known as:  K-DUR Take 4 tablets (40 mEq total) by mouth daily.      Follow-up Information    PCP 1 week  Follow up.            Time coordinating discharge: 31 min  Signed:  Ebunoluwa Gernert U Hazim Treadway   [facility] 05/29/2016, 10:34 AM

## 2016-05-29 NOTE — Clinical Social Work Note (Signed)
CSW faxed [facility] d/c summary and Fl2. CSW spoke with pt's daughter and she will pick up pt at 5:15 and take her back to [facility] Clinical Social Worker will sign off for now as social work intervention is no longer needed. Please consult [place] again if new need arises.   [address], [place]

## 2016-05-31 ENCOUNTER — Telehealth: Payer: Self-pay | Admitting: Cardiovascular Disease

## 2016-05-31 NOTE — Telephone Encounter (Signed)
Will forward to Dr Nahser to review. 

## 2016-05-31 NOTE — Telephone Encounter (Signed)
She does not need to keep that appt with Richardson Dopp She was just in the hospital  Reschedule for 3 months

## 2016-05-31 NOTE — Telephone Encounter (Signed)
New Message   Pt daughter called to ask if her mother still has to come to the upcoming appt with Richardson Dopp on 06/02/16 since she just saw Dr. Acie Fredrickson in the hospital. Requests a call back.

## 2016-05-31 NOTE — Telephone Encounter (Signed)
I called and spoke with Alexis Winters and made her aware that the patient does not need to keep her follow up on 06/02/16 with Alexis Dopp, PA per Dr. Acie Fredrickson, but the that he will see her in 3 months.  We will be in touch with her to set up that appointment.  Andrey Campanile is agreeable.

## 2016-06-01 LAB — URINE CULTURE

## 2016-06-01 NOTE — Telephone Encounter (Signed)
Follow up appointment scheduled with patient's daughter for 5/25 with Dr. Acie Fredrickson. She thanked me for the call.

## 2016-06-02 ENCOUNTER — Ambulatory Visit: Payer: Medicare Other | Admitting: Physician Assistant

## 2016-06-13 ENCOUNTER — Emergency Department (HOSPITAL_COMMUNITY): Payer: Medicare Other

## 2016-06-13 ENCOUNTER — Encounter (HOSPITAL_COMMUNITY): Payer: Self-pay | Admitting: Pharmacy Technician

## 2016-06-13 ENCOUNTER — Emergency Department (HOSPITAL_COMMUNITY)
Admission: EM | Admit: 2016-06-13 | Discharge: 2016-06-13 | Disposition: A | Discharge status: Home / Self Care | Payer: Medicare Other | Attending: Emergency Medicine | Admitting: Emergency Medicine

## 2016-06-13 DIAGNOSIS — I5032 Chronic diastolic (congestive) heart failure: Secondary | ICD-10-CM | POA: Insufficient documentation

## 2016-06-13 DIAGNOSIS — Z87891 Personal history of nicotine dependence: Secondary | ICD-10-CM | POA: Diagnosis not present

## 2016-06-13 DIAGNOSIS — R002 Palpitations: Secondary | ICD-10-CM | POA: Insufficient documentation

## 2016-06-13 DIAGNOSIS — Z7982 Long term (current) use of aspirin: Secondary | ICD-10-CM | POA: Diagnosis not present

## 2016-06-13 DIAGNOSIS — I110 Hypertensive heart disease with heart failure: Secondary | ICD-10-CM | POA: Diagnosis not present

## 2016-06-13 DIAGNOSIS — Z853 Personal history of malignant neoplasm of breast: Secondary | ICD-10-CM | POA: Insufficient documentation

## 2016-06-13 DIAGNOSIS — Z79899 Other long term (current) drug therapy: Secondary | ICD-10-CM | POA: Diagnosis not present

## 2016-06-13 LAB — BASIC METABOLIC PANEL
Anion gap: 11 (ref 5–15)
BUN: 17 mg/dL (ref 6–20)
CO2: 28 mmol/L (ref 22–32)
Calcium: 8.7 mg/dL — ABNORMAL LOW (ref 8.9–10.3)
Chloride: 103 mmol/L (ref 101–111)
Creatinine, Ser: 0.88 mg/dL (ref 0.44–1.00)
GFR, EST NON AFRICAN AMERICAN: 57 mL/min — AB (ref >60)
Glucose, Bld: 95 mg/dL (ref 65–99)
POTASSIUM: 3.4 mmol/L — AB (ref 3.5–5.1)
SODIUM: 142 mmol/L (ref 135–145)

## 2016-06-13 LAB — CBC WITH DIFFERENTIAL/PLATELET
BASOS ABS: 0 10*3/uL (ref 0.0–0.1)
Basophils Relative: 0 %
EOS ABS: 0.3 10*3/uL (ref 0.0–0.7)
EOS PCT: 5 %
HCT: 40 % (ref 36.0–46.0)
HEMOGLOBIN: 13.3 g/dL (ref 12.0–15.0)
LYMPHS ABS: 1.1 10*3/uL (ref 0.7–4.0)
LYMPHS PCT: 19 %
MCH: 29.2 pg (ref 26.0–34.0)
MCHC: 33.3 g/dL (ref 30.0–36.0)
MCV: 87.7 fL (ref 78.0–100.0)
Monocytes Absolute: 0.5 10*3/uL (ref 0.1–1.0)
Monocytes Relative: 8 %
NEUTROS PCT: 68 %
Neutro Abs: 4.1 10*3/uL (ref 1.7–7.7)
PLATELETS: 162 10*3/uL (ref 150–400)
RBC: 4.56 MIL/uL (ref 3.87–5.11)
RDW: 13.4 % (ref 11.5–15.5)
WBC: 6 10*3/uL (ref 4.0–10.5)

## 2016-06-13 NOTE — ED Notes (Signed)
BMP and CBC re-drawn and sent to lab.

## 2016-06-13 NOTE — Discharge Instructions (Signed)
No recurrent rapid heart rate here. Return for onset of rapid heart rate not resolving over 40 minutes. Labs without any significant abnormalities

## 2016-06-13 NOTE — ED Provider Notes (Signed)
[facility] DEPT Provider Note   CSN: [id number] Arrival date & time: 06/13/16  1744     History   Chief Complaint Chief Complaint  Patient presents with  . Palpitations    HPI CLIFTON KOVACIC is a 83 y.o. female.  Patient from [place]. EMS reported some particular tachycardia that converted in the ambulance without treatment. Patient historical sounds as has a history of rapid atrial fibrillation. Patient has baseline bilateral leg swelling. Patient known to have dementia. Patient without any specific complaints.      Past Medical History:  Diagnosis Date  . Allergic rhinitis   . Arrhythmia    a. office to call pt on 09/15/13 for 30 day event monitor  . Atrial fibrillation ([place])    a. h/o PAF, in NSR during admission 09/11/13  . Breast cancer ([place])   . Chronic diastolic CHF (congestive heart failure) ([place])    a. echo 09/12/13 EF 84-66%, grade 1 diastolic dysf, mild LVOT obstr, moderate MR, elevated filling pressure  . Dementia   . Dyslipidemia   . Enlarged liver   . Frequent UTI   . GERD (gastroesophageal reflux disease)   . Hard of hearing   . Hemorrhoids   . Hypertension   . Hypoglycemia   . Mitral regurgitation    a. moderate MR by echo 09/12/2013  . OA (osteoarthritis)    hip and back  . Orthostatic hypotension   . Phlebitis    l leg  . Prediabetes     Patient Active Problem List   Diagnosis Date Noted  . UTI (urinary tract infection) 05/29/2016  . Hypokalemia 04/28/2016  . Rash and nonspecific skin eruption 04/28/2016  . Bradycardia 04/27/2016  . Atrial flutter, paroxysmal ([place]) 06/22/2015  . Near syncope 01/10/2015  . Neck pain on left side 01/10/2015  . Pulmonary nodule, right 01/10/2015  . Leukocytosis 01/10/2015  . Hypocalcemia 01/10/2015  . Dyspnea 01/10/2015  . Chest pain 09/11/2013  . Bilateral neck pain 09/11/2013  . Angina at rest [facility]) 09/11/2013  . Leg edema 09/05/2013  . Failure to thrive 09/05/2013  . Chronic  diastolic CHF (congestive heart failure) ([place]) 09/05/2013    Past Surgical History:  Procedure Laterality Date  . ABDOMINAL HYSTERECTOMY    . BLADDER SUSPENSION    . BREAST LUMPECTOMY    . CARDIAC CATHETERIZATION  1980's   [facility]  . FOOT SURGERY     left foot  . INNER EAR SURGERY      OB History    No data available       Home Medications    Prior to Admission medications   Medication Sig Start Date End Date Taking? Authorizing Provider  acetaminophen (TYLENOL) 500 MG tablet Take 500 mg by mouth every 4 (four) hours as needed for mild pain.    Historical Provider, MD  alum & mag hydroxide-simeth ([place]) 200-200-20 MG/5ML suspension Take 30 mLs by mouth every 6 (six) hours as needed for indigestion or heartburn.    Historical Provider, MD  aspirin EC 81 MG tablet Take 81 mg by mouth daily.    Historical Provider, MD  cefUROXime (CEFTIN) 500 MG tablet Take 1 tablet (500 mg total) by mouth 2 (two) times daily with a meal. 05/29/16   Geradine Girt, DO  Cranberry 450 MG TABS Take 1 tablet by mouth 2 (two) times daily.    Historical Provider, MD  furosemide (LASIX) 40 MG tablet Take 40 mg by mouth 2 (two) times daily.  04/09/12   Historical Provider, MD  glycopyrrolate (ROBINUL) 1 MG tablet Take 1 mg by mouth every 8 (eight) hours as needed.    Historical Provider, MD  guaifenesin (ROBITUSSIN) 100 MG/5ML syrup Take 200 mg by mouth every 6 (six) hours as needed for cough.    Historical Provider, MD  ibuprofen (ADVIL,MOTRIN) 200 MG tablet Take 200 mg by mouth every 6 (six) hours as needed (back pain). Reported on 06/22/2015    Historical Provider, MD  isosorbide mononitrate (IMDUR) 30 MG 24 hr tablet Take 1 tablet (30 mg total) by mouth daily. 10/13/15   Thayer Headings, MD  magnesium hydroxide (MILK OF MAGNESIA) 400 MG/5ML suspension Take 30 mLs by mouth at bedtime as needed for mild constipation.    Historical Provider, MD  mirabegron ER (MYRBETRIQ) 25 MG TB24 tablet Take 25 mg by mouth daily.     Historical Provider, MD  Neomycin-Bacitracin-Polymyxin (HCA TRIPLE ANTIBIOTIC OINTMENT EX) Apply 1 application topically daily as needed (skintear/abrasions).    Historical Provider, MD  pantoprazole (PROTONIX) 40 MG tablet Take 40 mg by mouth daily. 10/19/14   Historical Provider, MD  polyethylene glycol (MIRALAX / GLYCOLAX) packet Take 17 g by mouth daily as needed for moderate constipation. 04/29/16   Bhavinkumar Bhagat, PA  potassium chloride (K-DUR) 10 MEQ tablet Take 4 tablets (40 mEq total) by mouth daily. 09/11/15   Leo Grosser, MD    Family History Family History  Problem Relation Age of Onset  . Cerebral palsy Sister   . Alzheimer's disease Brother   . Hypertension Father   . Heart disease Brother     Social History Social History  Substance Use Topics  . Smoking status: Former Research scientist (life sciences)  . Smokeless tobacco: Never Used  . Alcohol use No     Allergies   Contrast media [iodinated diagnostic agents]; Metoprolol; Pyridium [phenazopyridine hcl]; and Sulfa antibiotics   Review of Systems Review of Systems  Unable to perform ROS: Dementia     Physical Exam Updated Vital Signs BP 128/62   Pulse 73   Temp 97.5 F (36.4 C) (Oral)   Resp 18   Ht 5\' 4"  (1.626 m)   Wt 63.5 kg   SpO2 96%   BMI 24.03 kg/m   Physical Exam  Constitutional: She appears well-developed and well-nourished.  HENT:  Head: Normocephalic and atraumatic.  Mouth/Throat: Oropharynx is clear and moist.  Eyes: Conjunctivae and EOM are normal. Pupils are equal, round, and reactive to light.  Neck: Normal range of motion. Neck supple.  Cardiovascular: Normal rate and normal heart sounds.   Pulmonary/Chest: Effort normal and breath sounds normal. No respiratory distress.  Abdominal: Soft. Bowel sounds are normal. There is no tenderness.  Musculoskeletal: Normal range of motion. She exhibits edema.  Neurological: She is alert. No cranial nerve deficit. She exhibits normal muscle tone. Coordination  normal.  Skin: Skin is warm.  Nursing note and vitals reviewed.    ED Treatments / Results  Labs (all labs ordered are listed, but only abnormal results are displayed) Labs Reviewed  BASIC METABOLIC PANEL - Abnormal; Notable for the following:       Result Value   Potassium 3.4 (*)    Calcium 8.7 (*)    GFR calc non Af Amer 57 (*)    All other components within normal limits  CBC WITH DIFFERENTIAL/PLATELET  CBC WITH DIFFERENTIAL/PLATELET    EKG  EKG Interpretation  Date/Time:  Tuesday June 13 2016 17:57:38 EDT Ventricular Rate:  85 PR  Interval:    QRS Duration: 95 QT Interval:  381 QTC Calculation: 453 R Axis:   15 Text Interpretation:  Sinus rhythm Probable left atrial enlargement Consider RVH or posterior infarct No significant change since last tracing Confirmed by Delvonte Berenson  MD, Orazio Weller [phone]) on 06/13/2016 6:22:33 PM       Radiology Dg Chest 2 View  Result Date: 06/13/2016 CLINICAL DATA:  Chest pain and palpitations today. Pt states she has been here 4 times recently. EXAM: CHEST  2 VIEW COMPARISON:  05/28/2016; 04/27/2016 FINDINGS: Grossly unchanged cardiac silhouette and mediastinal contours with atherosclerotic plaque within the thoracic aorta. Large retrocardiac air in fluid containing structure compatible with a hiatal hernia. The lungs appear hyperexpanded with flattening the diaphragms. Mild diffuse slightly nodular thickening of the pulmonary interstitium. No focal airspace opacities. No pleural effusion or pneumothorax. No evidence of edema. No acute osseus abnormalities. Moderate multilevel lumbar spine DDD, incompletely evaluated. Surgical clips overlie the lateral aspect of the left breast. Degenerative change of the bilateral glenohumeral joints, incompletely evaluated. IMPRESSION: 1. Bronchitic change without superimposed acute cardiopulmonary disease. Specifically, no evidence of edema. 2. Large hiatal hernia. 3.  Aortic Atherosclerosis (ICD10-170.0)  Electronically Signed   By: Sandi Mariscal M.D.   On: 06/13/2016 18:53    Procedures Procedures (including critical care time)  Medications Ordered in ED Medications - No data to display   Initial Impression / Assessment and Plan / ED Course  I have reviewed the triage vital signs and the nursing notes.  Pertinent labs & imaging results that were available during my care of the patient were reviewed by me and considered in my medical decision making (see chart for details).    Patient with a history of rapid atrial fibrillation and EMS thought maybe there was an SVT today. It broke spontaneously. This is known to be a problem for the patient and based on her cardiologist in discussion with her daughter there isn't really anything that can be done. Patient needs a pacemaker but is not a candidate for. Patient without arrhythmias here basic labs without sniffing abnormalities. Patient stable for discharge back to nursing facility. Patient is a DO NOT RESUSCITATE.   Final Clinical Impressions(s) / ED Diagnoses   Final diagnoses:  Heart palpitations    New Prescriptions New Prescriptions   No medications on file     Fredia Sorrow, MD 06/13/16 2213

## 2016-06-13 NOTE — ED Triage Notes (Signed)
Pt from [facility] via EMS with reports of SVT (converted on her own in the ambulance). Pt with hx of SVT. Per EMS pt converted when IV was started. EMS reports rates around 160-190. Pt arrives with HR in the upper 80's and pressure 145/105 . Pt with hx of CHF and BLE edematous.

## 2016-06-13 NOTE — ED Notes (Signed)
Lab states CBC clotted. Will need a redraw.

## 2016-06-21 ENCOUNTER — Emergency Department (HOSPITAL_COMMUNITY): Payer: Medicare Other

## 2016-06-21 ENCOUNTER — Emergency Department (HOSPITAL_COMMUNITY)
Admission: EM | Admit: 2016-06-21 | Discharge: 2016-06-21 | Disposition: A | Discharge status: Home / Self Care | Payer: Medicare Other | Attending: Emergency Medicine | Admitting: Emergency Medicine

## 2016-06-21 ENCOUNTER — Encounter (HOSPITAL_COMMUNITY): Payer: Self-pay | Admitting: Emergency Medicine

## 2016-06-21 DIAGNOSIS — I110 Hypertensive heart disease with heart failure: Secondary | ICD-10-CM | POA: Insufficient documentation

## 2016-06-21 DIAGNOSIS — E876 Hypokalemia: Secondary | ICD-10-CM | POA: Diagnosis not present

## 2016-06-21 DIAGNOSIS — Z7982 Long term (current) use of aspirin: Secondary | ICD-10-CM | POA: Insufficient documentation

## 2016-06-21 DIAGNOSIS — I5032 Chronic diastolic (congestive) heart failure: Secondary | ICD-10-CM | POA: Diagnosis not present

## 2016-06-21 DIAGNOSIS — Z853 Personal history of malignant neoplasm of breast: Secondary | ICD-10-CM | POA: Insufficient documentation

## 2016-06-21 DIAGNOSIS — Z87891 Personal history of nicotine dependence: Secondary | ICD-10-CM | POA: Diagnosis not present

## 2016-06-21 DIAGNOSIS — R079 Chest pain, unspecified: Secondary | ICD-10-CM | POA: Insufficient documentation

## 2016-06-21 DIAGNOSIS — Z79899 Other long term (current) drug therapy: Secondary | ICD-10-CM | POA: Insufficient documentation

## 2016-06-21 LAB — BASIC METABOLIC PANEL
Anion gap: 11 (ref 5–15)
BUN: 25 mg/dL — ABNORMAL HIGH (ref 6–20)
CHLORIDE: 102 mmol/L (ref 101–111)
CO2: 25 mmol/L (ref 22–32)
Calcium: 8.8 mg/dL — ABNORMAL LOW (ref 8.9–10.3)
Creatinine, Ser: 0.91 mg/dL (ref 0.44–1.00)
GFR calc non Af Amer: 55 mL/min — ABNORMAL LOW (ref >60)
Glucose, Bld: 123 mg/dL — ABNORMAL HIGH (ref 65–99)
POTASSIUM: 3.1 mmol/L — AB (ref 3.5–5.1)
SODIUM: 138 mmol/L (ref 135–145)

## 2016-06-21 LAB — CBC WITH DIFFERENTIAL/PLATELET
BASOS ABS: 0 10*3/uL (ref 0.0–0.1)
BASOS PCT: 0 %
Eosinophils Absolute: 0.2 10*3/uL (ref 0.0–0.7)
Eosinophils Relative: 4 %
HEMATOCRIT: 39.3 % (ref 36.0–46.0)
Hemoglobin: 13 g/dL (ref 12.0–15.0)
LYMPHS PCT: 14 %
Lymphs Abs: 0.7 10*3/uL (ref 0.7–4.0)
MCH: 28.6 pg (ref 26.0–34.0)
MCHC: 33.1 g/dL (ref 30.0–36.0)
MCV: 86.6 fL (ref 78.0–100.0)
Monocytes Absolute: 0.5 10*3/uL (ref 0.1–1.0)
Monocytes Relative: 8 %
NEUTROS ABS: 3.9 10*3/uL (ref 1.7–7.7)
Neutrophils Relative %: 74 %
Platelets: 155 10*3/uL (ref 150–400)
RBC: 4.54 MIL/uL (ref 3.87–5.11)
RDW: 13.1 % (ref 11.5–15.5)
WBC: 5.3 10*3/uL (ref 4.0–10.5)

## 2016-06-21 LAB — TROPONIN I: TROPONIN I: 0.07 ng/mL — AB (ref <0.03)

## 2016-06-21 MED ORDER — ASPIRIN 300 MG RE SUPP: 300.0000 mg | Freq: Once | RECTAL | Status: DC

## 2016-06-21 MED ORDER — POTASSIUM CHLORIDE 20 MEQ/15ML (10%) PO SOLN
40.0000 meq | Freq: Once | ORAL | Status: AC
  Administered 2016-06-21: 40 meq via ORAL
  Filled 2016-06-21: qty 30

## 2016-06-21 MED ORDER — POTASSIUM CHLORIDE ER 10 MEQ PO TBCR: 20.0000 meq | EXTENDED_RELEASE_TABLET | Freq: Three times a day (TID) | ORAL | 0 refills | Status: DC

## 2016-06-21 NOTE — ED Provider Notes (Signed)
[facility] DEPT Provider Note   CSN: [id number] Arrival date & time: 06/21/16  0006  By signing my name below, I, Collene Leyden, attest that this documentation has been prepared under the direction and in the presence of Sherwood Gambler, MD. Electronically Signed: Collene Leyden, Scribe. 06/21/16. 12:24 AM.  History   Chief Complaint Chief Complaint  Patient presents with  . Chest Pain   LEVEL 5 CAVEAT DUE TO DEMENTIA   HPI Comments: Alexis Winters is a 83 y.o. female with a history of A-Fib, diastolic CHF, HTN, HLD, who presents to the Emergency Department complaining of sudden-onset left-sided chest pain that began at 11:15 tonight. Per EMS, the patient was picked up from [facility] memory care unit, chest pain began at 11:15 PM and ended at 11:20 PM. Patient had one episode of SVT (HR 160) upon arrival, which resolved before giving the patient medication. Per EMS, the patient is currently at baseline and is no longer having chest pain. Daughter states the patient called her about her chest pain at 9PM tonight. Per daughter the patient stated that this chest pain was different than prior chest pain, radiated up the sternum. Patient states she took aspirin after chest pain.    The history is provided by the EMS personnel and a relative. No language interpreter was used.    Past Medical History:  Diagnosis Date  . Allergic rhinitis   . Arrhythmia    a. office to call pt on 09/15/13 for 30 day event monitor  . Atrial fibrillation ([place])    a. h/o PAF, in NSR during admission 09/11/13  . Breast cancer ([place])   . Chronic diastolic CHF (congestive heart failure) ([place])    a. echo 09/12/13 EF 62-83%, grade 1 diastolic dysf, mild LVOT obstr, moderate MR, elevated filling pressure  . Dementia   . Dyslipidemia   . Enlarged liver   . Frequent UTI   . GERD (gastroesophageal reflux disease)   . Hard of hearing   . Hemorrhoids   . Hypertension   . Hypoglycemia   . Mitral regurgitation     a. moderate MR by echo 09/12/2013  . OA (osteoarthritis)    hip and back  . Orthostatic hypotension   . Phlebitis    l leg  . Prediabetes     Patient Active Problem List   Diagnosis Date Noted  . UTI (urinary tract infection) 05/29/2016  . Hypokalemia 04/28/2016  . Rash and nonspecific skin eruption 04/28/2016  . Bradycardia 04/27/2016  . Atrial flutter, paroxysmal ([place]) 06/22/2015  . Near syncope 01/10/2015  . Neck pain on left side 01/10/2015  . Pulmonary nodule, right 01/10/2015  . Leukocytosis 01/10/2015  . Hypocalcemia 01/10/2015  . Dyspnea 01/10/2015  . Chest pain 09/11/2013  . Bilateral neck pain 09/11/2013  . Angina at rest [facility]) 09/11/2013  . Leg edema 09/05/2013  . Failure to thrive 09/05/2013  . Chronic diastolic CHF (congestive heart failure) ([place]) 09/05/2013    Past Surgical History:  Procedure Laterality Date  . ABDOMINAL HYSTERECTOMY    . BLADDER SUSPENSION    . BREAST LUMPECTOMY    . CARDIAC CATHETERIZATION  1980's   [facility]  . FOOT SURGERY     left foot  . INNER EAR SURGERY      OB History    No data available       Home Medications    Prior to Admission medications   Medication Sig Start Date End Date Taking? Authorizing Provider  acetaminophen (TYLENOL)  500 MG tablet Take 500 mg by mouth every 4 (four) hours as needed for mild pain.   Yes Historical Provider, MD  alum & mag hydroxide-simeth (MINTOX) 200-200-20 MG/5ML suspension Take 30 mLs by mouth every 6 (six) hours as needed for indigestion or heartburn.   Yes Historical Provider, MD  aspirin EC 81 MG tablet Take 81 mg by mouth daily.   Yes Historical Provider, MD  Cranberry 450 MG TABS Take 1 tablet by mouth 2 (two) times daily.   Yes Historical Provider, MD  furosemide (LASIX) 40 MG tablet Take 40 mg by mouth 2 (two) times daily.  04/09/12  Yes Historical Provider, MD  glycopyrrolate (ROBINUL) 1 MG tablet Take 1 mg by mouth every 8 (eight) hours as needed (IBS).    Yes Historical Provider,  MD  guaifenesin (ROBITUSSIN) 100 MG/5ML syrup Take 200 mg by mouth every 6 (six) hours as needed for cough.   Yes Historical Provider, MD  ibuprofen (ADVIL,MOTRIN) 200 MG tablet Take 200 mg by mouth every 6 (six) hours as needed (back pain). Reported on 06/22/2015   Yes Historical Provider, MD  isosorbide mononitrate (IMDUR) 30 MG 24 hr tablet Take 1 tablet (30 mg total) by mouth daily. 10/13/15  Yes Thayer Headings, MD  loperamide (IMODIUM) 2 MG capsule Take 2 mg by mouth daily as needed for diarrhea or loose stools.   Yes Historical Provider, MD  magnesium hydroxide (MILK OF MAGNESIA) 400 MG/5ML suspension Take 30 mLs by mouth at bedtime as needed for mild constipation.   Yes Historical Provider, MD  mirabegron ER (MYRBETRIQ) 25 MG TB24 tablet Take 25 mg by mouth daily.   Yes Historical Provider, MD  Neomycin-Bacitracin-Polymyxin (HCA TRIPLE ANTIBIOTIC OINTMENT EX) Apply 1 application topically daily as needed (skintear/abrasions).   Yes Historical Provider, MD  pantoprazole (PROTONIX) 40 MG tablet Take 40 mg by mouth daily. 10/19/14  Yes Historical Provider, MD  polyethylene glycol (MIRALAX / GLYCOLAX) packet Take 17 g by mouth daily as needed for moderate constipation. 04/29/16  Yes Bhavinkumar Bhagat, PA  cefUROXime (CEFTIN) 500 MG tablet Take 1 tablet (500 mg total) by mouth 2 (two) times daily with a meal. Patient not taking: Reported on 06/21/2016 05/29/16   Geradine Girt, DO  potassium chloride (K-DUR) 10 MEQ tablet Take 2 tablets (20 mEq total) by mouth 3 (three) times daily. 06/21/16   Sherwood Gambler, MD    Family History Family History  Problem Relation Age of Onset  . Cerebral palsy Sister   . Alzheimer's disease Brother   . Hypertension Father   . Heart disease Brother     Social History Social History  Substance Use Topics  . Smoking status: Former Research scientist (life sciences)  . Smokeless tobacco: Never Used  . Alcohol use No     Allergies   Contrast media [iodinated diagnostic agents];  Metoprolol; Pyridium [phenazopyridine hcl]; Levaquin [levofloxacin in d5w]; Macrodantin [nitrofurantoin macrocrystal]; and Sulfa antibiotics   Review of Systems Review of Systems  Unable to perform ROS: Dementia     Physical Exam Updated Vital Signs BP 135/66   Pulse 82   Temp 97.8 F (36.6 C) (Oral)   Resp 19   SpO2 99%   Physical Exam  Constitutional: She appears well-developed and well-nourished.  HENT:  Head: Normocephalic and atraumatic.  Right Ear: External ear normal.  Left Ear: External ear normal.  Nose: Nose normal.  Eyes: Right eye exhibits no discharge. Left eye exhibits no discharge.  Cardiovascular: Normal rate, regular rhythm and  normal heart sounds.   2+ radial pulses.   Pulmonary/Chest: Effort normal and breath sounds normal.  Abdominal: Soft. There is no tenderness.  Musculoskeletal: She exhibits edema (bilateral chronic appearing lower extremity edema).  Neurological: She is alert. She is disoriented.  Skin: Skin is warm and dry.  Nursing note and vitals reviewed.    ED Treatments / Results  DIAGNOSTIC STUDIES: Oxygen Saturation is 99% on RA, normal by my interpretation.    COORDINATION OF CARE: 12:24 AM Discussed treatment plan with daughter at bedside and daughter agreed to plan, which includes observation In the emergency department.    Labs (all labs ordered are listed, but only abnormal results are displayed) Labs Reviewed  BASIC METABOLIC PANEL - Abnormal; Notable for the following:       Result Value   Potassium 3.1 (*)    Glucose, Bld 123 (*)    BUN 25 (*)    Calcium 8.8 (*)    GFR calc non Af Amer 55 (*)    All other components within normal limits  TROPONIN I - Abnormal; Notable for the following:    Troponin I 0.07 (*)    All other components within normal limits  CBC WITH DIFFERENTIAL/PLATELET    EKG  EKG Interpretation  Date/Time:  Wednesday June 21 2016 00:20:48 EDT Ventricular Rate:  76 PR Interval:    QRS  Duration: 94 QT Interval:  398 QTC Calculation: 448 R Axis:   21 Text Interpretation:  Sinus rhythm Prolonged PR interval Abnormal R-wave progression, early transition no significant change since Jun 13 2016 Confirmed by Regenia Skeeter MD, Revonda Menter [phone]) on 06/21/2016 12:46:02 AM       Radiology Dg Chest Portable 1 View  Result Date: 06/21/2016 CLINICAL DATA:  83 y/o  F; chest pain. EXAM: PORTABLE CHEST 1 VIEW COMPARISON:  06/13/2016 chest radiograph. FINDINGS: Stable normal cardiac silhouette. Large hiatal hernia. Aortic atherosclerosis with calcification. Clear lungs. No pleural effusion or pneumothorax. Bones are unremarkable. IMPRESSION: No active disease.  Large hiatal hernia.  Aortic atherosclerosis. Electronically Signed   By: Kristine Garbe M.D.   On: 06/21/2016 00:55    Procedures Procedures (including critical care time)  Medications Ordered in ED Medications  potassium chloride 20 MEQ/15ML (10%) solution 40 mEq (40 mEq Oral Given 06/21/16 0347)     Initial Impression / Assessment and Plan / ED Course  I have reviewed the triage vital signs and the nursing notes.  Pertinent labs & imaging results that were available during my care of the patient were reviewed by me and considered in my medical decision making (see chart for details).  Clinical Course as of Jun 21 736  Wed Jun 21, 2016  0025 Patient is currently asymptomatic. Most likely, this most recent episode was related to the SVT. I had a discussion with the daughter, patient is a DO NOT RESUSCITATE and at this point she wants the patient to be comfortable and pain-free. Does not appear to want an extensive workup. Plan to check labs, chest x-ray, ECG, and monitor for her going back into SVT.  [SG]    Clinical Course User Index [SG] Sherwood Gambler, MD    Patient remains pain free and no arrhythmias noted. Multiple discussions with daughter, who at this time just wants patient to be comfortable. Troponin mildly  elevated, similar to earlier in the month. Upon more discussion, daughter would not want to intervene even if patient has had an MI. Most likely mild troponin is from the SVT itself.  Given all this, plan to d/c back to facility and advise of return precautions. Has hypokalemia, already on K but also on lasix. Given K here and increase dose at home. Discussed return precautions.   Final Clinical Impressions(s) / ED Diagnoses   Final diagnoses:  Chest pain at rest  Hypokalemia    New Prescriptions Discharge Medication List as of 06/21/2016  2:48 AM     I personally performed the services described in this documentation, which was scribed in my presence. The recorded information has been reviewed and is accurate.     Sherwood Gambler, MD 06/21/16 [phone]

## 2016-06-21 NOTE — ED Triage Notes (Signed)
Per EMS, pt from [facility] memory care unit c/o sudden onset left sided chest pain. Pt in SVT with a rate of 160 bpm on EMS monitor, pt converted self, to a sinus brady with a HR in the 30s, then NSR with a rate in the 70s. Pt denies chest pain at this time. Hx SVT. BP-134/82

## 2016-06-21 NOTE — ED Notes (Signed)
Dr. Regenia Skeeter and Leeanne Deed, primary RN aware of pt troponin

## 2016-07-24 ENCOUNTER — Encounter (HOSPITAL_COMMUNITY): Payer: Self-pay | Admitting: *Deleted

## 2016-07-24 ENCOUNTER — Emergency Department (HOSPITAL_COMMUNITY)
Admission: EM | Admit: 2016-07-24 | Discharge: 2016-07-24 | Disposition: A | Discharge status: Home / Self Care | Payer: Medicare Other | Attending: Emergency Medicine | Admitting: Emergency Medicine

## 2016-07-24 DIAGNOSIS — N390 Urinary tract infection, site not specified: Secondary | ICD-10-CM | POA: Diagnosis not present

## 2016-07-24 DIAGNOSIS — R002 Palpitations: Secondary | ICD-10-CM | POA: Diagnosis present

## 2016-07-24 DIAGNOSIS — Z7982 Long term (current) use of aspirin: Secondary | ICD-10-CM | POA: Insufficient documentation

## 2016-07-24 DIAGNOSIS — I110 Hypertensive heart disease with heart failure: Secondary | ICD-10-CM | POA: Insufficient documentation

## 2016-07-24 DIAGNOSIS — I5032 Chronic diastolic (congestive) heart failure: Secondary | ICD-10-CM | POA: Insufficient documentation

## 2016-07-24 DIAGNOSIS — Z87891 Personal history of nicotine dependence: Secondary | ICD-10-CM | POA: Diagnosis not present

## 2016-07-24 DIAGNOSIS — Z853 Personal history of malignant neoplasm of breast: Secondary | ICD-10-CM | POA: Diagnosis not present

## 2016-07-24 DIAGNOSIS — I471 Supraventricular tachycardia: Secondary | ICD-10-CM | POA: Insufficient documentation

## 2016-07-24 LAB — CBC WITH DIFFERENTIAL/PLATELET
Basophils Absolute: 0 10*3/uL (ref 0.0–0.1)
Basophils Relative: 0 %
Eosinophils Absolute: 0.1 10*3/uL (ref 0.0–0.7)
Eosinophils Relative: 2 %
HCT: 38 % (ref 36.0–46.0)
Hemoglobin: 12.5 g/dL (ref 12.0–15.0)
Lymphocytes Relative: 23 %
Lymphs Abs: 1.3 10*3/uL (ref 0.7–4.0)
MCH: 28.7 pg (ref 26.0–34.0)
MCHC: 32.9 g/dL (ref 30.0–36.0)
MCV: 87.2 fL (ref 78.0–100.0)
Monocytes Absolute: 0.3 10*3/uL (ref 0.1–1.0)
Monocytes Relative: 5 %
Neutro Abs: 3.7 10*3/uL (ref 1.7–7.7)
Neutrophils Relative %: 70 %
Platelets: 144 10*3/uL — ABNORMAL LOW (ref 150–400)
RBC: 4.36 MIL/uL (ref 3.87–5.11)
RDW: 13.5 % (ref 11.5–15.5)
WBC: 5.4 10*3/uL (ref 4.0–10.5)

## 2016-07-24 LAB — BASIC METABOLIC PANEL
Anion gap: 7 (ref 5–15)
BUN: 21 mg/dL — ABNORMAL HIGH (ref 6–20)
CO2: 29 mmol/L (ref 22–32)
Calcium: 8.8 mg/dL — ABNORMAL LOW (ref 8.9–10.3)
Chloride: 105 mmol/L (ref 101–111)
Creatinine, Ser: 0.92 mg/dL (ref 0.44–1.00)
GFR calc Af Amer: >60 mL/min (ref >60)
GFR calc non Af Amer: 54 mL/min — ABNORMAL LOW (ref >60)
Glucose, Bld: 105 mg/dL — ABNORMAL HIGH (ref 65–99)
Potassium: 3.5 mmol/L (ref 3.5–5.1)
Sodium: 141 mmol/L (ref 135–145)

## 2016-07-24 LAB — URINALYSIS, ROUTINE W REFLEX MICROSCOPIC
BILIRUBIN URINE: NEGATIVE
Glucose, UA: NEGATIVE mg/dL
Hgb urine dipstick: NEGATIVE
KETONES UR: NEGATIVE mg/dL
NITRITE: NEGATIVE
Protein, ur: NEGATIVE mg/dL
Specific Gravity, Urine: 1.004 — ABNORMAL LOW (ref 1.005–1.030)
Squamous Epithelial / LPF: NONE SEEN
pH: 7 (ref 5.0–8.0)

## 2016-07-24 MED ORDER — LIDOCAINE HCL (PF) 1 % IJ SOLN
INTRAMUSCULAR | Status: AC
  Administered 2016-07-24: 5 mL
  Filled 2016-07-24: qty 5

## 2016-07-24 MED ORDER — CEFTRIAXONE SODIUM 1 G IJ SOLR
1.0000 g | Freq: Once | INTRAMUSCULAR | Status: AC
  Administered 2016-07-24: 1 g via INTRAMUSCULAR
  Filled 2016-07-24: qty 10

## 2016-07-24 MED ORDER — CEPHALEXIN 500 MG PO CAPS: 500.0000 mg | ORAL_CAPSULE | Freq: Two times a day (BID) | ORAL | 0 refills | Status: AC

## 2016-07-24 NOTE — ED Triage Notes (Signed)
To ED from assisted living for SVT. Pt was converted with 6mg  Adenosine enroute. HR 88 currently

## 2016-07-24 NOTE — ED Provider Notes (Signed)
[facility] DEPT Provider Note   CSN: [id number] Arrival date & time: 07/24/16  1430     History   Chief Complaint Chief Complaint  Patient presents with  . Tachycardia    HPI Alexis Winters is a 83 y.o. female.  The history is provided by the patient.  Palpitations   This is a recurrent problem. The current episode started less than 1 hour ago. The problem occurs constantly. The problem has been resolved. Associated with: adenosine. Associated symptoms include malaise/fatigue, irregular heartbeat, nausea and shortness of breath. Pertinent negatives include no diaphoresis, no fever, no numbness, no chest pain, no chest pressure, no claudication, no exertional chest pressure, no near-syncope, no syncope, no abdominal pain, no vomiting, no headaches, no back pain, no leg pain, no lower extremity edema, no cough, no hemoptysis and no sputum production. The treatment provided significant relief. Risk factors: hx of tachy brady syndrome and AFIB. Her past medical history does not include anemia.    Past Medical History:  Diagnosis Date  . Allergic rhinitis   . Arrhythmia    a. office to call pt on 09/15/13 for 30 day event monitor  . Atrial fibrillation ([place])    a. h/o PAF, in NSR during admission 09/11/13  . Breast cancer ([place])   . Chronic diastolic CHF (congestive heart failure) ([place])    a. echo 09/12/13 EF 53-29%, grade 1 diastolic dysf, mild LVOT obstr, moderate MR, elevated filling pressure  . Dementia   . Dyslipidemia   . Enlarged liver   . Frequent UTI   . GERD (gastroesophageal reflux disease)   . Hard of hearing   . Hemorrhoids   . Hypertension   . Hypoglycemia   . Mitral regurgitation    a. moderate MR by echo 09/12/2013  . OA (osteoarthritis)    hip and back  . Orthostatic hypotension   . Phlebitis    l leg  . Prediabetes     Patient Active Problem List   Diagnosis Date Noted  . UTI (urinary tract infection) 05/29/2016  . Hypokalemia 04/28/2016  . Rash  and nonspecific skin eruption 04/28/2016  . Bradycardia 04/27/2016  . Atrial flutter, paroxysmal ([place]) 06/22/2015  . Near syncope 01/10/2015  . Neck pain on left side 01/10/2015  . Pulmonary nodule, right 01/10/2015  . Leukocytosis 01/10/2015  . Hypocalcemia 01/10/2015  . Dyspnea 01/10/2015  . Chest pain 09/11/2013  . Bilateral neck pain 09/11/2013  . Angina at rest [facility]) 09/11/2013  . Leg edema 09/05/2013  . Failure to thrive 09/05/2013  . Chronic diastolic CHF (congestive heart failure) ([place]) 09/05/2013    Past Surgical History:  Procedure Laterality Date  . ABDOMINAL HYSTERECTOMY    . BLADDER SUSPENSION    . BREAST LUMPECTOMY    . CARDIAC CATHETERIZATION  1980's   [facility]  . FOOT SURGERY     left foot  . INNER EAR SURGERY      OB History    No data available       Home Medications    Prior to Admission medications   Medication Sig Start Date End Date Taking? Authorizing Provider  acetaminophen (TYLENOL) 500 MG tablet Take 500 mg by mouth every 4 (four) hours as needed for mild pain.   Yes Historical Provider, MD  alum & mag hydroxide-simeth (MINTOX) 200-200-20 MG/5ML suspension Take 30 mLs by mouth every 6 (six) hours as needed for indigestion or heartburn.   Yes Historical Provider, MD  aspirin EC 81 MG tablet Take 81  mg by mouth daily.   Yes Historical Provider, MD  Cranberry 450 MG TABS Take 1 tablet by mouth 2 (two) times daily.   Yes Historical Provider, MD  furosemide (LASIX) 40 MG tablet Take 40 mg by mouth 2 (two) times daily.  04/09/12  Yes Historical Provider, MD  glycopyrrolate (ROBINUL) 1 MG tablet Take 1 mg by mouth every 8 (eight) hours as needed (IBS).    Yes Historical Provider, MD  guaifenesin (ROBITUSSIN) 100 MG/5ML syrup Take 200 mg by mouth every 6 (six) hours as needed for cough.   Yes Historical Provider, MD  ibuprofen (ADVIL,MOTRIN) 200 MG tablet Take 200 mg by mouth every 6 (six) hours as needed (back pain). Reported on 06/22/2015   Yes Historical  Provider, MD  isosorbide mononitrate (IMDUR) 30 MG 24 hr tablet Take 1 tablet (30 mg total) by mouth daily. 10/13/15  Yes Thayer Headings, MD  loperamide (IMODIUM) 2 MG capsule Take 2 mg by mouth daily as needed for diarrhea or loose stools.   Yes Historical Provider, MD  magnesium hydroxide (MILK OF MAGNESIA) 400 MG/5ML suspension Take 30 mLs by mouth at bedtime as needed for mild constipation.   Yes Historical Provider, MD  mirabegron ER (MYRBETRIQ) 25 MG TB24 tablet Take 25 mg by mouth daily.   Yes Historical Provider, MD  Neomycin-Bacitracin-Polymyxin (HCA TRIPLE ANTIBIOTIC OINTMENT EX) Apply 1 application topically daily as needed (skintear/abrasions).   Yes Historical Provider, MD  pantoprazole (PROTONIX) 40 MG tablet Take 40 mg by mouth daily. 10/19/14  Yes Historical Provider, MD  polyethylene glycol (MIRALAX / GLYCOLAX) packet Take 17 g by mouth daily as needed for moderate constipation. 04/29/16  Yes Bhavinkumar Bhagat, PA  potassium chloride (K-DUR) 10 MEQ tablet Take 2 tablets (20 mEq total) by mouth 3 (three) times daily. 06/21/16  Yes Sherwood Gambler, MD  cephALEXin (KEFLEX) 500 MG capsule Take 1 capsule (500 mg total) by mouth 2 (two) times daily. 07/24/16 07/31/16  Esaw Grandchild, MD    Family History Family History  Problem Relation Age of Onset  . Cerebral palsy Sister   . Alzheimer's disease Brother   . Hypertension Father   . Heart disease Brother     Social History Social History  Substance Use Topics  . Smoking status: Former Research scientist (life sciences)  . Smokeless tobacco: Never Used  . Alcohol use No     Allergies   Contrast media [iodinated diagnostic agents]; Metoprolol; Pyridium [phenazopyridine hcl]; Levaquin [levofloxacin in d5w]; Macrodantin [nitrofurantoin macrocrystal]; and Sulfa antibiotics   Review of Systems Review of Systems  Constitutional: Positive for malaise/fatigue. Negative for diaphoresis and fever.  HENT: Negative for facial swelling.   Eyes: Negative for pain.    Respiratory: Positive for shortness of breath. Negative for cough, hemoptysis and sputum production.   Cardiovascular: Positive for palpitations. Negative for chest pain, claudication, syncope and near-syncope.  Gastrointestinal: Positive for nausea. Negative for abdominal pain and vomiting.  Genitourinary: Positive for frequency and urgency. Negative for dysuria.  Musculoskeletal: Negative for back pain.  Skin: Negative for rash.  Neurological: Negative for numbness and headaches.  Psychiatric/Behavioral: Positive for confusion.     Physical Exam Updated Vital Signs BP 132/67 (BP Location: Right Arm)   Pulse 71   Temp 97.5 F (36.4 C) (Oral)   Resp 18   SpO2 99%   Physical Exam  Constitutional: She appears well-developed and well-nourished. No distress.  HENT:  Head: Normocephalic and atraumatic.  Eyes: Conjunctivae and EOM are normal.  Neck: Neck supple.  Cardiovascular: Normal rate and regular rhythm.   No murmur heard. Pulmonary/Chest: Effort normal and breath sounds normal. No respiratory distress.  Abdominal: Soft. There is no tenderness.  Musculoskeletal: She exhibits no edema.  Neurological: She is alert. She is disoriented. GCS eye subscore is 4. GCS verbal subscore is 5. GCS motor subscore is 6.  Skin: Skin is warm and dry. No rash noted.     Psychiatric: She has a normal mood and affect. Cognition and memory are impaired.  Nursing note and vitals reviewed.    ED Treatments / Results  Labs (all labs ordered are listed, but only abnormal results are displayed) Labs Reviewed  CBC WITH DIFFERENTIAL/PLATELET - Abnormal; Notable for the following:       Result Value   Platelets 144 (*)    All other components within normal limits  BASIC METABOLIC PANEL - Abnormal; Notable for the following:    Glucose, Bld 105 (*)    BUN 21 (*)    Calcium 8.8 (*)    GFR calc non Af Amer 54 (*)    All other components within normal limits  URINALYSIS, ROUTINE W REFLEX  MICROSCOPIC - Abnormal; Notable for the following:    Color, Urine STRAW (*)    Specific Gravity, Urine 1.004 (*)    Leukocytes, UA MODERATE (*)    Bacteria, UA MANY (*)    All other components within normal limits    EKG  EKG Interpretation None       Radiology No results found.  Procedures Procedures (including critical care time)  Medications Ordered in ED Medications  cefTRIAXone (ROCEPHIN) injection 1 g (not administered)     Initial Impression / Assessment and Plan / ED Course  I have reviewed the triage vital signs and the nursing notes.  Pertinent labs & imaging results that were available during my care of the patient were reviewed by me and considered in my medical decision making (see chart for details).     83 year old female with severe dementia presents in the setting of elevated heart rate. Per daughter's patient was finishing lunch when she was walking back to her room at nursing home when she reports to nurses she had palpitations. Patient found to have elevated heart rate. Per EMS report patient was found to be in SVT which resolved with 6 mg of adenosine. Patient returned to normal sinus rhythm and all her symptoms resolved.  On arrival patient hemodynamically stable and afebrile. EKG reveals normal sinus rhythm. Patient resting comfortably in bed. Per report patient has been having urinary frequency. Family reports frequent UTIs. Had extensive discussion with family and evaluated previous records. Cardiology has stated patient is not a candidate for pacemaker in setting of tachybradycardia syndrome. Patient additionally has recurrent A. fib with RVR and recurrent SVT. She could have been in either of these rhythms today but those symptoms have completely resolved. Family did not want evaluation for ACS as they report they did not patient have to wait for repeat troponins. They report that cardiology stated there is no intervention is necessary. They did agree  to evaluate electrolytes which were within normal limits. Additionally no signs of anemia. Urinary evaluation revealed acute UTI. Patient given Rocephin and emergency department will be discharged home on Keflex. This has thought in the past to be nidus for elevated heart rate at times. Patient remained stable on emergency department will be discharged back to facility. Patient to follow-up with [facility] provider facility. Strict return precautions given and patient  stable at time of discharge. Family in agreement with plan.  Final Clinical Impressions(s) / ED Diagnoses   Final diagnoses:  SVT (supraventricular tachycardia) (HCC)  Acute UTI    New Prescriptions New Prescriptions   CEPHALEXIN (KEFLEX) 500 MG CAPSULE    Take 1 capsule (500 mg total) by mouth 2 (two) times daily.     Esaw Grandchild, MD 07/24/16 [place], MD 07/25/16 [id number]

## 2016-08-01 ENCOUNTER — Encounter: Payer: Self-pay | Admitting: Cardiovascular Disease

## 2016-08-18 ENCOUNTER — Ambulatory Visit (INDEPENDENT_AMBULATORY_CARE_PROVIDER_SITE_OTHER): Payer: Medicare Other | Admitting: Cardiovascular Disease

## 2016-08-18 ENCOUNTER — Encounter: Payer: Self-pay | Admitting: Cardiovascular Disease

## 2016-08-18 VITALS — BP 130/70 | HR 79 | Ht 63.0 in | Wt 133.1 lb

## 2016-08-18 DIAGNOSIS — F0390 Unspecified dementia without behavioral disturbance: Secondary | ICD-10-CM | POA: Diagnosis not present

## 2016-08-18 DIAGNOSIS — I471 Supraventricular tachycardia: Secondary | ICD-10-CM | POA: Diagnosis not present

## 2016-08-18 NOTE — Progress Notes (Signed)
Alexis Winters Date of Birth  [birth year]       Alexis [address] Alexis [place], [place], Alexis  [place]   [place], [place]  [address] [place]   Fax  [phone]     Fax [phone]  Problem List: 1. Atrial fibrillation 2. Congestive heart failure 3. Cardiac cath with cardiac arrest  History of Present Illness:  [name] is a 83 yo with hx of CHF, palpitations ( lasts for a minute or so).  This past Friday, she was seen at her medical doctors office - tachycardia, fatigue, lots of peripheral edema.    She has these spells on occasion.    For the past 2 weeks she has had progressive "spells" with severe dyspnea, profound fatigue,  Some mild , vague chest tightness, tachypalpitations.  She also has some left hand tingling.    October 21, 2013:  [name] was admitted to the hospital with vague symptoms -- ? CP and increased dyspnea. Echo shows normal LV function.  Moderate MR. She is doing better.  Has home health coming out  Her dementia is gradually worsening.   October 13, 2015:  [name]  is seen today for follow up of her atrial fib.   Was seen with daughter, Andrey Campanile.  Converted spontanously to NSR   Troponin levels were mildy elevated  She has occasional left sided chest pain  Beneath her left breast.   Lasts 1-2 minutes.    Aug 18, 2016:  [name] is seen with daughter, Andrey Campanile. Has been having lots of SVT - the last episode converted with Adenosine 6 mg IV .  She has dementia and it is difficult for her to remember to tell her staff at the SNF. [facility], on [address])   We had her on toprol but she had an episode of syncope and bradycardia ( HR of 36) while on toprol. It was held since that time. She has had multiple episodes of SVT .   Current Outpatient Prescriptions on File Prior to Visit  Medication Sig Dispense Refill  . acetaminophen (TYLENOL)  500 MG tablet Take 500 mg by mouth every 4 (four) hours as needed for mild pain.    Marland Kitchen alum & mag hydroxide-simeth (MINTOX) 245-809-98 MG/5ML suspension Take 30 mLs by mouth every 6 (six) hours as needed for indigestion or heartburn.    Marland Kitchen aspirin EC 81 MG tablet Take 81 mg by mouth daily.    . Cranberry 450 MG TABS Take 1 tablet by mouth 2 (two) times daily.    . furosemide (LASIX) 40 MG tablet Take 40 mg by mouth 2 (two) times daily.     Marland Kitchen glycopyrrolate (ROBINUL) 1 MG tablet Take 1 mg by mouth every 8 (eight) hours as needed (IBS).     Marland Kitchen guaifenesin (ROBITUSSIN) 100 MG/5ML syrup Take 200 mg by mouth every 6 (six) hours as needed for cough.    Marland Kitchen ibuprofen (ADVIL,MOTRIN) 200 MG tablet Take 200 mg by mouth every 6 (six) hours as needed (back pain). Reported on 06/22/2015    . isosorbide mononitrate (IMDUR) 30 MG 24 hr tablet Take 1 tablet (30 mg total) by mouth daily. 30 tablet 11  . loperamide (IMODIUM) 2 MG capsule Take 2 mg by mouth daily as needed for diarrhea or loose stools.    . magnesium hydroxide (MILK OF MAGNESIA) 400 MG/5ML  suspension Take 30 mLs by mouth at bedtime as needed for mild constipation.    . mirabegron ER (MYRBETRIQ) 25 MG TB24 tablet Take 25 mg by mouth daily.    Marland Kitchen Neomycin-Bacitracin-Polymyxin (HCA TRIPLE ANTIBIOTIC OINTMENT EX) Apply 1 application topically daily as needed (skintear/abrasions).    . pantoprazole (PROTONIX) 40 MG tablet Take 40 mg by mouth daily.  3  . polyethylene glycol (MIRALAX / GLYCOLAX) packet Take 17 g by mouth daily as needed for moderate constipation. 14 each 0  . potassium chloride (K-DUR) 10 MEQ tablet Take 2 tablets (20 mEq total) by mouth 3 (three) times daily. 20 tablet 0   No current facility-administered medications on file prior to visit.     Allergies  Allergen Reactions  . Contrast Media [Iodinated Diagnostic Agents] Other (See Comments)    Cardiac arrest  . Metoprolol Other (See Comments)    Per daughter "causes her heart to race,  syncope" Takes brand name Toprol XL only  . Pyridium [Phenazopyridine Hcl] Swelling  . Levaquin [Levofloxacin In D5w] Other (See Comments)    On MAR  . Macrodantin [Nitrofurantoin Macrocrystal] Other (See Comments)    On MAR  . Sulfa Antibiotics Other (See Comments)    On MAR    Past Medical History:  Diagnosis Date  . Allergic rhinitis   . Arrhythmia    a. office to call pt on 09/15/13 for 30 day event monitor  . Atrial fibrillation ([place])    a. h/o PAF, in NSR during admission 09/11/13  . Breast cancer ([place])   . Chronic diastolic CHF (congestive heart failure) ([place])    a. echo 09/12/13 EF 34-19%, grade 1 diastolic dysf, mild LVOT obstr, moderate MR, elevated filling pressure  . Dementia   . Dyslipidemia   . Enlarged liver   . Frequent UTI   . GERD (gastroesophageal reflux disease)   . Hard of hearing   . Hemorrhoids   . Hypertension   . Hypoglycemia   . Mitral regurgitation    a. moderate MR by echo 09/12/2013  . OA (osteoarthritis)    hip and back  . Orthostatic hypotension   . Phlebitis    l leg  . Prediabetes     Past Surgical History:  Procedure Laterality Date  . ABDOMINAL HYSTERECTOMY    . BLADDER SUSPENSION    . BREAST LUMPECTOMY    . CARDIAC CATHETERIZATION  1980's   [facility]  . FOOT SURGERY     left foot  . INNER EAR SURGERY      History  Smoking Status  . Former Smoker  Smokeless Tobacco  . Never Used    History  Alcohol Use No    Family History  Problem Relation Age of Onset  . Cerebral palsy Sister   . Alzheimer's disease Brother   . Hypertension Father   . Heart disease Brother     Reviw of Systems:  Reviewed in the HPI.  All other systems are negative.  Physical Exam: Blood pressure 130/70, pulse 79, height 5\' 3"  (1.6 m), weight 133 lb 1.9 oz (60.4 kg), SpO2 98 %. Wt Readings from Last 3 Encounters:  08/18/16 133 lb 1.9 oz (60.4 kg)  06/13/16 140 lb (63.5 kg)  05/29/16 138 lb 11.2 oz (62.9 kg)     General: Frail, elderly white  female. She is hard of hearing. She seems to be easily distracted. I suspect that she has mild dementia.  Head: Normocephalic, atraumatic, sclera non-icteric, mucus membranes are moist,  Neck: Supple. Carotids are 2 + without bruits. No JVD   Lungs: Clear   Heart:  Regular rate, S1-S2.  Abdomen: Soft, non-tender, non-distended with normal bowel sounds.  Msk:  Strength and tone are normal   Extremities: No clubbing or cyanosis. 2+ pitting edema.  The legs appear to be chronically edematous.  Distal pedal pulses are 2+ and equal  She's quite frail. She walks very slowly and seemed to need a lot of assistance. We had to assist her  getting up and down off the exam table.  Neuro: CN II - XII intact.  Except she is partially deaf.  Alert and oriented X 3.   Psych:   She seems to have at least mild dementia. Her hearing impairment makes assessment of this difficulty.  ECG:  Assessment / Plan:   1. Supraventricular tachycardia  Currently in normal sinus rhythm 2. Congestive heart failure 3. Cardiac cath with cardiac arrest 4. Chest discomfort:  Ms.  Potier is having episodes of SVT which leads to chest pain. She is converted several times with IV adenosine. We tried her on low-dose Toprol-XL but this caused bradycardia and an episode of syncope which resulted in a hospitalization.  She is currently at [facility]. They do not have nursing staff to give medications on a regular  basis. I have taught  and her daughter in the use of the Valsalva maneuver and the diving reflex..  Have written the orders to the SNF.Marland Kitchen  She is not a candidate for invasive procedures ( ablation or pacer )   Continue medical therapy   Will see her in 3 months.     Mertie Moores, MD  08/18/2016 4:09 PM    [facility] [place],  [place], [place]  [address] Pager [phone] Phone: [phone]; Fax: [phone]

## 2016-08-18 NOTE — Patient Instructions (Signed)
Medication Instructions:    Your physician recommends that you continue on your current medications as directed. Please refer to the Current Medication list given to you today.  - If you need a refill on your cardiac medications before your next appointment, please call your pharmacy.   Labwork:  None ordered  Testing/Procedures:  None ordered  Follow-Up:  Your physician recommends that you schedule a follow-up appointment in: 3 months with Dr. Acie Fredrickson.  Thank you for choosing [facility]!!

## 2016-08-19 ENCOUNTER — Emergency Department (HOSPITAL_COMMUNITY)
Admission: EM | Admit: 2016-08-19 | Discharge: 2016-08-19 | Disposition: A | Discharge status: Home / Self Care | Payer: Medicare Other | Attending: Emergency Medicine | Admitting: Emergency Medicine

## 2016-08-19 ENCOUNTER — Encounter (HOSPITAL_COMMUNITY): Payer: Self-pay

## 2016-08-19 ENCOUNTER — Other Ambulatory Visit: Payer: Self-pay

## 2016-08-19 DIAGNOSIS — Z7982 Long term (current) use of aspirin: Secondary | ICD-10-CM | POA: Diagnosis not present

## 2016-08-19 DIAGNOSIS — R000 Tachycardia, unspecified: Secondary | ICD-10-CM | POA: Diagnosis present

## 2016-08-19 DIAGNOSIS — Z853 Personal history of malignant neoplasm of breast: Secondary | ICD-10-CM | POA: Diagnosis not present

## 2016-08-19 DIAGNOSIS — I5032 Chronic diastolic (congestive) heart failure: Secondary | ICD-10-CM | POA: Insufficient documentation

## 2016-08-19 DIAGNOSIS — Z87891 Personal history of nicotine dependence: Secondary | ICD-10-CM | POA: Insufficient documentation

## 2016-08-19 DIAGNOSIS — I110 Hypertensive heart disease with heart failure: Secondary | ICD-10-CM | POA: Insufficient documentation

## 2016-08-19 DIAGNOSIS — I471 Supraventricular tachycardia: Secondary | ICD-10-CM

## 2016-08-19 DIAGNOSIS — N390 Urinary tract infection, site not specified: Secondary | ICD-10-CM | POA: Diagnosis not present

## 2016-08-19 LAB — URINALYSIS, ROUTINE W REFLEX MICROSCOPIC
Bilirubin Urine: NEGATIVE
GLUCOSE, UA: NEGATIVE mg/dL
KETONES UR: NEGATIVE mg/dL
Nitrite: NEGATIVE
PROTEIN: NEGATIVE mg/dL
SQUAMOUS EPITHELIAL / LPF: NONE SEEN
Specific Gravity, Urine: 1.005 (ref 1.005–1.030)
pH: 6 (ref 5.0–8.0)

## 2016-08-19 LAB — I-STAT CHEM 8, ED
BUN: 24 mg/dL — ABNORMAL HIGH (ref 6–20)
CHLORIDE: 104 mmol/L (ref 101–111)
Calcium, Ion: 1.06 mmol/L — ABNORMAL LOW (ref 1.15–1.40)
Creatinine, Ser: 1 mg/dL (ref 0.44–1.00)
Glucose, Bld: 111 mg/dL — ABNORMAL HIGH (ref 65–99)
HCT: 38 % (ref 36.0–46.0)
HEMOGLOBIN: 12.9 g/dL (ref 12.0–15.0)
POTASSIUM: 3.7 mmol/L (ref 3.5–5.1)
Sodium: 141 mmol/L (ref 135–145)
TCO2: 27 mmol/L (ref 0–100)

## 2016-08-19 MED ORDER — CEPHALEXIN 250 MG PO CAPS: 500.0000 mg | ORAL_CAPSULE | Freq: Once | ORAL | Status: DC

## 2016-08-19 MED ORDER — CEPHALEXIN 500 MG PO CAPS: 500.0000 mg | ORAL_CAPSULE | Freq: Two times a day (BID) | ORAL | 0 refills | Status: AC

## 2016-08-19 NOTE — Discharge Instructions (Signed)
Please read and follow all provided instructions.  Your diagnoses today include:  1. SVT (supraventricular tachycardia) ([place])   2. Urinary tract infection without hematuria, site unspecified     Tests performed today include: Vital signs. See below for your results today.   Medications prescribed:  Take as prescribed   Home care instructions:  Follow any educational materials contained in this packet.  Follow-up instructions: Please follow-up with your primary care provider for further evaluation of symptoms and treatment   Return instructions:  Please return to the Emergency Department if you do not get better, if you get worse, or new symptoms OR  - Fever (temperature greater than 101.28F)  - Bleeding that does not stop with holding pressure to the area    -Severe pain (please note that you may be more sore the day after your accident)  - Chest Pain  - Difficulty breathing  - Severe nausea or vomiting  - Inability to tolerate food and liquids  - Passing out  - Skin becoming red around your wounds  - Change in mental status (confusion or lethargy)  - New numbness or weakness    Please return if you have any other emergent concerns.  Additional Information:  Your vital signs today were: BP 135/71    Pulse 73    Temp 97.7 F (36.5 C) (Oral)    Resp 17    Ht 5\' 3"  (1.6 m)    Wt 59 kg (130 lb)    SpO2 99%    BMI 23.03 kg/m  If your blood pressure (BP) was elevated above 135/85 this visit, please have this repeated by your doctor within one month. ---------------

## 2016-08-19 NOTE — ED Provider Notes (Signed)
[facility] DEPT Provider Note   CSN: [id number] Arrival date & time: 08/19/16  1523     History   Chief Complaint Chief Complaint  Patient presents with  . Tachycardia  . Chest Pain    HPI Alexis Winters is a 83 y.o. female.  HPI  83 y.o. female with a hx of Atrial Fibrillation, Chronic Diastolic CHF, Dementia, HTN, SVT, presents to the Emergency Department today from [facility] with c/o SVT. EMS notified due to chest pain that resolved spontaneously. Hx same. This occurs often. Seen on 07-24-16, 06-21-16, 06-13-16  for same. Seen by Cardiology yesterday. Pt daughter states that her mother had chest pain initially and medical staff was notified. EMS called and put her on monitor with HR between 160-190s. Diagnosed with SVT en route that resolved spontaneously. Denies symptoms currently. No CP/SOB/ABD pain. No N/V/D. No headaches. No numbness. No dysuria. Noted urinary frequency yesterday. Pt daughter wishes to have her checked for UTI. No fevers. No cough/congestion. No other symptoms noted. Pt is DNR  Cardiology- Dr. Acie Fredrickson  Past Medical History:  Diagnosis Date  . Allergic rhinitis   . Arrhythmia    a. office to call pt on 09/15/13 for 30 day event monitor  . Atrial fibrillation ([place])    a. h/o PAF, in NSR during admission 09/11/13  . Breast cancer ([place])   . Chronic diastolic CHF (congestive heart failure) ([place])    a. echo 09/12/13 EF 01-09%, grade 1 diastolic dysf, mild LVOT obstr, moderate MR, elevated filling pressure  . Dementia   . Dyslipidemia   . Enlarged liver   . Frequent UTI   . GERD (gastroesophageal reflux disease)   . Hard of hearing   . Hemorrhoids   . Hypertension   . Hypoglycemia   . Mitral regurgitation    a. moderate MR by echo 09/12/2013  . OA (osteoarthritis)    hip and back  . Orthostatic hypotension   . Phlebitis    l leg  . Prediabetes     Patient Active Problem List   Diagnosis Date Noted  . SVT (supraventricular tachycardia)  ([place]) 08/18/2016  . Dementia without behavioral disturbance 08/18/2016  . UTI (urinary tract infection) 05/29/2016  . Hypokalemia 04/28/2016  . Rash and nonspecific skin eruption 04/28/2016  . Bradycardia 04/27/2016  . Atrial flutter, paroxysmal ([place]) 06/22/2015  . Near syncope 01/10/2015  . Neck pain on left side 01/10/2015  . Pulmonary nodule, right 01/10/2015  . Leukocytosis 01/10/2015  . Hypocalcemia 01/10/2015  . Dyspnea 01/10/2015  . Chest pain 09/11/2013  . Bilateral neck pain 09/11/2013  . Angina at rest [facility]) 09/11/2013  . Leg edema 09/05/2013  . Failure to thrive 09/05/2013  . Chronic diastolic CHF (congestive heart failure) ([place]) 09/05/2013    Past Surgical History:  Procedure Laterality Date  . ABDOMINAL HYSTERECTOMY    . BLADDER SUSPENSION    . BREAST LUMPECTOMY    . CARDIAC CATHETERIZATION  1980's   [facility]  . FOOT SURGERY     left foot  . INNER EAR SURGERY      OB History    No data available       Home Medications    Prior to Admission medications   Medication Sig Start Date End Date Taking? Authorizing Provider  acetaminophen (TYLENOL) 500 MG tablet Take 500 mg by mouth every 4 (four) hours as needed for mild pain.    [provider]  alum & mag hydroxide-simeth (Friona) 200-200-20 MG/5ML suspension Take  30 mLs by mouth every 6 (six) hours as needed for indigestion or heartburn.    [provider]  aspirin EC 81 MG tablet Take 81 mg by mouth daily.    [provider]  Cranberry 450 MG TABS Take 1 tablet by mouth 2 (two) times daily.    [provider]  furosemide (LASIX) 40 MG tablet Take 40 mg by mouth 2 (two) times daily.  04/09/12   [provider]  glycopyrrolate (ROBINUL) 1 MG tablet Take 1 mg by mouth every 8 (eight) hours as needed (IBS).     [provider]  guaifenesin (ROBITUSSIN) 100 MG/5ML syrup Take 200 mg by mouth every 6 (six) hours as needed for cough.    [provider]    ibuprofen (ADVIL,MOTRIN) 200 MG tablet Take 200 mg by mouth every 6 (six) hours as needed (back pain). Reported on 06/22/2015    [provider]  isosorbide mononitrate (IMDUR) 30 MG 24 hr tablet Take 1 tablet (30 mg total) by mouth daily. 10/13/15   Nahser, Wonda Cheng, MD  loperamide (IMODIUM) 2 MG capsule Take 2 mg by mouth daily as needed for diarrhea or loose stools.    [provider]  magnesium hydroxide (MILK OF MAGNESIA) 400 MG/5ML suspension Take 30 mLs by mouth at bedtime as needed for mild constipation.    [provider]  mirabegron ER (MYRBETRIQ) 25 MG TB24 tablet Take 25 mg by mouth daily.    [provider]  Neomycin-Bacitracin-Polymyxin (HCA TRIPLE ANTIBIOTIC OINTMENT EX) Apply 1 application topically daily as needed (skintear/abrasions).    [provider]  pantoprazole (PROTONIX) 40 MG tablet Take 40 mg by mouth daily. 10/19/14   [provider]  polyethylene glycol (MIRALAX / GLYCOLAX) packet Take 17 g by mouth daily as needed for moderate constipation. 04/29/16   Bhagat, Crista Luria, PA  potassium chloride (K-DUR) 10 MEQ tablet Take 2 tablets (20 mEq total) by mouth 3 (three) times daily. 06/21/16   Sherwood Gambler, MD    Family History Family History  Problem Relation Age of Onset  . Cerebral palsy Sister   . Alzheimer's disease Brother   . Hypertension Father   . Heart disease Brother     Social History Social History  Substance Use Topics  . Smoking status: Former Research scientist (life sciences)  . Smokeless tobacco: Never Used  . Alcohol use No     Allergies   Contrast media [iodinated diagnostic agents]; Metoprolol; Pyridium [phenazopyridine hcl]; Levaquin [levofloxacin in d5w]; Macrodantin [nitrofurantoin macrocrystal]; and Sulfa antibiotics   Review of Systems Review of Systems ROS reviewed and all are negative for acute change except as noted in the HPI.  Physical Exam Updated Vital Signs SpO2 98% Comment: ra  Physical Exam   Constitutional: Vital signs are normal. She appears well-developed and well-nourished. No distress.  HENT:  Head: Normocephalic and atraumatic.  Right Ear: Hearing, tympanic membrane, external ear and ear canal normal.  Left Ear: Hearing, tympanic membrane, external ear and ear canal normal.  Nose: Nose normal.  Mouth/Throat: Uvula is midline, oropharynx is clear and moist and mucous membranes are normal. No trismus in the jaw. No oropharyngeal exudate, posterior oropharyngeal erythema or tonsillar abscesses.  Eyes: Conjunctivae and EOM are normal. Pupils are equal, round, and reactive to light.  Neck: Normal range of motion. Neck supple. No tracheal deviation present.  Cardiovascular: Normal rate, regular rhythm, S1 normal, S2 normal, normal heart sounds, intact distal pulses and normal pulses.   Pulmonary/Chest: Effort normal  and breath sounds normal. No respiratory distress. She has no decreased breath sounds. She has no wheezes. She has no rhonchi. She has no rales.  Abdominal: Soft. Normal appearance and bowel sounds are normal. There is no tenderness. There is no rigidity, no rebound, no guarding, no CVA tenderness, no tenderness at McBurney's point and negative Murphy's sign.  Musculoskeletal: Normal range of motion.  Neurological: She is alert. She is disoriented (baseline).  Skin: Skin is warm and dry.  Psychiatric: She has a normal mood and affect. Her speech is normal and behavior is normal. Thought content normal.  Nursing note and vitals reviewed.  ED Treatments / Results  Labs (all labs ordered are listed, but only abnormal results are displayed) Labs Reviewed  URINALYSIS, ROUTINE W REFLEX MICROSCOPIC - Abnormal; Notable for the following:       Result Value   APPearance HAZY (*)    Hgb urine dipstick MODERATE (*)    Leukocytes, UA LARGE (*)    Bacteria, UA FEW (*)    All other components within normal limits  I-STAT CHEM 8, ED - Abnormal; Notable for the following:     BUN 24 (*)    Glucose, Bld 111 (*)    Calcium, Ion 1.06 (*)    All other components within normal limits  URINE CULTURE    EKG  EKG Interpretation  Date/Time:  Saturday Aug 19 2016 15:24:12 EDT Ventricular Rate:  74 PR Interval:  208 QRS Duration: 78 QT Interval:  384 QTC Calculation: 426 R Axis:   2 Text Interpretation:  Normal sinus rhythm Normal ECG No significant change since last tracing Confirmed by [facility] MD, Junie Panning ([id number]) on 08/19/2016 4:28:15 PM       Radiology No results found.  Procedures Procedures (including critical care time)  Medications Ordered in ED Medications - No data to display   Initial Impression / Assessment and Plan / ED Course  I have reviewed the triage vital signs and the nursing notes.  Pertinent labs & imaging results that were available during my care of the patient were reviewed by me and considered in my medical decision making (see chart for details).  Final Clinical Impressions(s) / ED Diagnoses  {I have reviewed and evaluated the relevant laboratory values.  {I have interpreted the relevant EKG. {I have reviewed the relevant previous healthcare records. {I have reviewed EMS Documentation. {I obtained HPI from historian. {Patient discussed with supervising physician.  ED Course:  Assessment: Pt is a 83 y.o. female with hx Atrial Fibrillation, Chronic Diastolic CHF, Dementia, HTN, SVT who presents due to SVT with associated chest pain. Resolved en route without intervention. CP resolved with resolution of SVT. Hx same in past. Seen in ED x 3 with same. Seen by Cardiology yesterday. No N/V. No CP/SOB/ABD pain. No fevers. Pt daughter concern for possible UTI with urinary frequency. No dysuria. On exam, pt in NAD. Nontoxic/nonseptic appearing. VSS. Afebrile. Lungs CTA. Heart RRR. Abdomen nontender soft. iStat Chem 8 unremarkable. EKG NSR. unremarkable. UA with suspect UTI. Given Keflex in ED. Seen by supervising physician. Plan is to DC home  with follow up to PCP. At time of discharge, Patient is in no acute distress. Vital Signs are stable. Patient is able to ambulate. Patient able to tolerate PO.   Disposition/Plan:  DC Home Additional Verbal discharge instructions given and discussed with patient.  Pt Instructed to f/u with PCP in the next week for evaluation and treatment of symptoms. Return precautions given Pt acknowledges and  agrees with plan  Supervising Physician Gareth Morgan, MD  Final diagnoses:  SVT (supraventricular tachycardia) [facility])  Urinary tract infection without hematuria, site unspecified    New Prescriptions New Prescriptions   No medications on file     Shary Decamp, Hershal Coria 08/19/16 1705    Gareth Morgan, MD 08/20/16 1655

## 2016-08-19 NOTE — ED Notes (Signed)
ED Provider at bedside. 

## 2016-08-19 NOTE — ED Triage Notes (Signed)
Pt arrives EMS from [place]. With c/o SVT that converted with EMS pt c/o chest pain at [facility] thast resolved with SVT

## 2016-08-22 LAB — URINE CULTURE

## 2016-08-23 ENCOUNTER — Telehealth: Payer: Self-pay | Admitting: Emergency Medicine

## 2016-08-23 NOTE — Telephone Encounter (Signed)
Post ED Visit - Positive Culture Follow-up  Culture report reviewed by antimicrobial stewardship pharmacist:  []  Elenor Quinones, Pharm.D. [x]  Heide Guile, Pharm.D., BCPS AQ-ID []  Parks Neptune, Pharm.D., BCPS []  Alycia Rossetti, Pharm.D., BCPS []  Rankin, Pharm.D., BCPS, AAHIVP []  Legrand Como, Pharm.D., BCPS, AAHIVP []  Salome Arnt, PharmD, BCPS []  Dimitri Ped, PharmD, BCPS []  Vincenza Hews, PharmD, BCPS  Positive urine culture Treated with cephalexin, organism sensitive to the same and no further patient follow-up is required at this time.  Hazle Nordmann 08/23/2016, 11:44 AM

## 2016-08-30 ENCOUNTER — Ambulatory Visit: Payer: Medicare Other | Admitting: Cardiovascular Disease

## 2016-09-09 ENCOUNTER — Ambulatory Visit (HOSPITAL_COMMUNITY)
Admission: EM | Admit: 2016-09-09 | Discharge: 2016-09-09 | Disposition: A | Discharge status: Home / Self Care | Payer: Medicare Other | Attending: Family Medicine | Admitting: Family Medicine

## 2016-09-09 ENCOUNTER — Encounter (HOSPITAL_COMMUNITY): Payer: Self-pay | Admitting: Emergency Medicine

## 2016-09-09 DIAGNOSIS — N3000 Acute cystitis without hematuria: Secondary | ICD-10-CM

## 2016-09-09 DIAGNOSIS — R350 Frequency of micturition: Secondary | ICD-10-CM | POA: Diagnosis present

## 2016-09-09 DIAGNOSIS — M549 Dorsalgia, unspecified: Secondary | ICD-10-CM | POA: Insufficient documentation

## 2016-09-09 DIAGNOSIS — R300 Dysuria: Secondary | ICD-10-CM | POA: Diagnosis not present

## 2016-09-09 LAB — POCT URINALYSIS DIP (DEVICE)
Bilirubin Urine: NEGATIVE
Glucose, UA: NEGATIVE mg/dL
Hgb urine dipstick: NEGATIVE
KETONES UR: NEGATIVE mg/dL
NITRITE: NEGATIVE
PH: 5 (ref 5.0–8.0)
PROTEIN: NEGATIVE mg/dL
Specific Gravity, Urine: 1.01 (ref 1.005–1.030)
Urobilinogen, UA: 0.2 mg/dL (ref 0.0–1.0)

## 2016-09-09 MED ORDER — CEPHALEXIN 500 MG PO CAPS: 500.0000 mg | ORAL_CAPSULE | Freq: Two times a day (BID) | ORAL | 0 refills | Status: DC

## 2016-09-09 MED ORDER — CEPHALEXIN 500 MG PO CAPS: 500.0000 mg | ORAL_CAPSULE | Freq: Two times a day (BID) | ORAL | 0 refills | Status: AC

## 2016-09-09 NOTE — ED Triage Notes (Signed)
Pt c/o UTI sx onset last night associated w/back pain, dysuria, urinary freq/urgency  Denies fevers, chills  Alert... Brought back on wheel chair... Daughter at bedside   NAD.

## 2016-09-09 NOTE — ED Provider Notes (Signed)
CSN: [id number]     Arrival date & time 09/09/16  1943 History   First MD Initiated Contact with Patient 09/09/16 2100     Chief Complaint  Patient presents with  . Urinary Tract Infection   (Consider location/radiation/quality/duration/timing/severity/associated sxs/prior Treatment) Patient is a 83 y.o. Female, with hx of frequent UTI, brought in by daughter today with concern for UTI onset yesterday. Patient is memory care facility; history is mainly provided by the daughter.   Reports 2 days duration of dysuria, urinary frequency and urgency along with back pain. No fever reports. No nausea or abdominal pain reported.       Past Medical History:  Diagnosis Date  . Allergic rhinitis   . Arrhythmia    a. office to call pt on 09/15/13 for 30 day event monitor  . Atrial fibrillation ([place])    a. h/o PAF, in NSR during admission 09/11/13  . Breast cancer ([place])   . Chronic diastolic CHF (congestive heart failure) ([place])    a. echo 09/12/13 EF 35-70%, grade 1 diastolic dysf, mild LVOT obstr, moderate MR, elevated filling pressure  . Dementia   . Dyslipidemia   . Enlarged liver   . Frequent UTI   . GERD (gastroesophageal reflux disease)   . Hard of hearing   . Hemorrhoids   . Hypertension   . Hypoglycemia   . Mitral regurgitation    a. moderate MR by echo 09/12/2013  . OA (osteoarthritis)    hip and back  . Orthostatic hypotension   . Phlebitis    l leg  . Prediabetes    Past Surgical History:  Procedure Laterality Date  . ABDOMINAL HYSTERECTOMY    . BLADDER SUSPENSION    . BREAST LUMPECTOMY    . CARDIAC CATHETERIZATION  1980's   [facility]  . FOOT SURGERY     left foot  . INNER EAR SURGERY     Family History  Problem Relation Age of Onset  . Cerebral palsy Sister   . Alzheimer's disease Brother   . Hypertension Father   . Heart disease Brother    Social History  Substance Use Topics  . Smoking status: Former Research scientist (life sciences)  . Smokeless tobacco: Never Used  . Alcohol use No    OB History    No data available     Review of Systems  Constitutional:       See HPI    Allergies  Contrast media [iodinated diagnostic agents]; Metoprolol; Pyridium [phenazopyridine hcl]; Levaquin [levofloxacin in d5w]; Macrodantin [nitrofurantoin macrocrystal]; and Sulfa antibiotics  Home Medications   Prior to Admission medications   Medication Sig Start Date End Date Taking? Authorizing Provider  acetaminophen (TYLENOL) 500 MG tablet Take 500 mg by mouth every 4 (four) hours as needed for mild pain.    [provider]  alum & mag hydroxide-simeth ([place]) 200-200-20 MG/5ML suspension Take 30 mLs by mouth every 6 (six) hours as needed for indigestion or heartburn.    [provider]  aspirin EC 81 MG tablet Take 81 mg by mouth daily.    [provider]  cephALEXin (KEFLEX) 500 MG capsule Take 1 capsule (500 mg total) by mouth 2 (two) times daily. 09/09/16 09/16/16  Barry Dienes, NP  Cranberry 450 MG TABS Take 450 mg by mouth 2 (two) times daily.     [provider]  furosemide (LASIX) 40 MG tablet Take 40 mg by mouth 2 (two) times daily.  04/09/12   [provider]  glycopyrrolate (ROBINUL)  1 MG tablet Take 1 mg by mouth every 8 (eight) hours as needed (IBS).     [provider]  guaifenesin (ROBITUSSIN) 100 MG/5ML syrup Take 200 mg by mouth every 6 (six) hours as needed for cough.    [provider]  ibuprofen (ADVIL,MOTRIN) 200 MG tablet Take 200 mg by mouth every 6 (six) hours as needed (back pain). Reported on 06/22/2015    [provider]  isosorbide mononitrate (IMDUR) 30 MG 24 hr tablet Take 1 tablet (30 mg total) by mouth daily. 10/13/15   Nahser, Wonda Cheng, MD  loperamide (IMODIUM) 2 MG capsule Take 2 mg by mouth daily as needed for diarrhea or loose stools.    [provider]  magnesium hydroxide (MILK OF MAGNESIA) 400 MG/5ML suspension Take 30 mLs by mouth at bedtime as needed for mild constipation.     [provider]  mirabegron ER (MYRBETRIQ) 25 MG TB24 tablet Take 25 mg by mouth daily.    [provider]  Neomycin-Bacitracin-Polymyxin (HCA TRIPLE ANTIBIOTIC OINTMENT EX) Apply 1 application topically daily as needed (skintear/abrasions).    [provider]  pantoprazole (PROTONIX) 40 MG tablet Take 40 mg by mouth daily. 10/19/14   [provider]  polyethylene glycol (MIRALAX / GLYCOLAX) packet Take 17 g by mouth daily as needed for moderate constipation. 04/29/16   Bhagat, Crista Luria, PA  potassium chloride (K-DUR) 10 MEQ tablet Take 2 tablets (20 mEq total) by mouth 3 (three) times daily. 06/21/16   Sherwood Gambler, MD   Meds Ordered and Administered this Visit  Medications - No data to display  BP (!) 150/70 (BP Location: Left Arm)   Pulse 77   Temp 97.8 F (36.6 C) (Oral)   Resp 20   SpO2 96%  No data found.   Physical Exam  Constitutional: She appears well-developed and well-nourished.  Cardiovascular: Normal rate, regular rhythm and normal heart sounds.   Pulmonary/Chest: Effort normal and breath sounds normal. She has no wheezes.  Abdominal: Soft. Bowel sounds are normal. There is no tenderness.  Genitourinary:  Genitourinary Comments: Questionable left CVA tenderness  Skin: Skin is warm and dry.  Nursing note and vitals reviewed.   Urgent Care Course     Procedures (including critical care time)  Labs Review Labs Reviewed  POCT URINALYSIS DIP (DEVICE) - Abnormal; Notable for the following:       Result Value   Leukocytes, UA SMALL (*)    All other components within normal limits  URINE CULTURE    Imaging Review No results found.   MDM   1. Acute cystitis without hematuria    Will treat empirically for UTI while pending culture.  RX for Keflex BID x 7 days given.  F/u with PCP for no improvement.     Barry Dienes, NP 09/09/16 2137

## 2016-09-11 ENCOUNTER — Emergency Department (HOSPITAL_COMMUNITY): Payer: Medicare Other

## 2016-09-11 ENCOUNTER — Emergency Department (HOSPITAL_COMMUNITY)
Admission: EM | Admit: 2016-09-11 | Discharge: 2016-09-11 | Disposition: A | Discharge status: Home / Self Care | Payer: Medicare Other | Attending: Emergency Medicine | Admitting: Emergency Medicine

## 2016-09-11 ENCOUNTER — Encounter (HOSPITAL_COMMUNITY): Payer: Self-pay | Admitting: Emergency Medicine

## 2016-09-11 DIAGNOSIS — I110 Hypertensive heart disease with heart failure: Secondary | ICD-10-CM | POA: Insufficient documentation

## 2016-09-11 DIAGNOSIS — I5032 Chronic diastolic (congestive) heart failure: Secondary | ICD-10-CM | POA: Insufficient documentation

## 2016-09-11 DIAGNOSIS — Z87891 Personal history of nicotine dependence: Secondary | ICD-10-CM | POA: Insufficient documentation

## 2016-09-11 DIAGNOSIS — Z853 Personal history of malignant neoplasm of breast: Secondary | ICD-10-CM | POA: Diagnosis not present

## 2016-09-11 DIAGNOSIS — Z7982 Long term (current) use of aspirin: Secondary | ICD-10-CM | POA: Diagnosis not present

## 2016-09-11 DIAGNOSIS — R000 Tachycardia, unspecified: Secondary | ICD-10-CM | POA: Diagnosis present

## 2016-09-11 LAB — PROTIME-INR
INR: 1.12
PROTHROMBIN TIME: 14.5 s (ref 11.4–15.2)

## 2016-09-11 LAB — CBC WITH DIFFERENTIAL/PLATELET
BASOS PCT: 0 %
Basophils Absolute: 0 10*3/uL (ref 0.0–0.1)
EOS ABS: 0.1 10*3/uL (ref 0.0–0.7)
EOS PCT: 2 %
HCT: 42.2 % (ref 36.0–46.0)
Hemoglobin: 13.7 g/dL (ref 12.0–15.0)
Lymphocytes Relative: 23 %
Lymphs Abs: 1.1 10*3/uL (ref 0.7–4.0)
MCH: 28.7 pg (ref 26.0–34.0)
MCHC: 32.5 g/dL (ref 30.0–36.0)
MCV: 88.5 fL (ref 78.0–100.0)
MONO ABS: 0.4 10*3/uL (ref 0.1–1.0)
MONOS PCT: 7 %
Neutro Abs: 3.3 10*3/uL (ref 1.7–7.7)
Neutrophils Relative %: 68 %
Platelets: 168 10*3/uL (ref 150–400)
RBC: 4.77 MIL/uL (ref 3.87–5.11)
RDW: 13.8 % (ref 11.5–15.5)
WBC: 4.9 10*3/uL (ref 4.0–10.5)

## 2016-09-11 LAB — COMPREHENSIVE METABOLIC PANEL
ALT: 31 U/L (ref 14–54)
ANION GAP: 6 (ref 5–15)
AST: 44 U/L — ABNORMAL HIGH (ref 15–41)
Albumin: 3.5 g/dL (ref 3.5–5.0)
Alkaline Phosphatase: 105 U/L (ref 38–126)
BUN: 20 mg/dL (ref 6–20)
CHLORIDE: 107 mmol/L (ref 101–111)
CO2: 29 mmol/L (ref 22–32)
CREATININE: 0.88 mg/dL (ref 0.44–1.00)
Calcium: 8.6 mg/dL — ABNORMAL LOW (ref 8.9–10.3)
GFR, EST NON AFRICAN AMERICAN: 57 mL/min — AB (ref >60)
Glucose, Bld: 99 mg/dL (ref 65–99)
POTASSIUM: 3.6 mmol/L (ref 3.5–5.1)
Sodium: 142 mmol/L (ref 135–145)
Total Bilirubin: 0.6 mg/dL (ref 0.3–1.2)
Total Protein: 6.5 g/dL (ref 6.5–8.1)

## 2016-09-11 LAB — URINALYSIS, ROUTINE W REFLEX MICROSCOPIC
Bilirubin Urine: NEGATIVE
Glucose, UA: NEGATIVE mg/dL
HGB URINE DIPSTICK: NEGATIVE
KETONES UR: NEGATIVE mg/dL
Leukocytes, UA: NEGATIVE
Nitrite: NEGATIVE
PROTEIN: NEGATIVE mg/dL
Specific Gravity, Urine: 1.005 (ref 1.005–1.030)
pH: 6 (ref 5.0–8.0)

## 2016-09-11 LAB — TROPONIN I

## 2016-09-11 LAB — MAGNESIUM: MAGNESIUM: 2.3 mg/dL (ref 1.7–2.4)

## 2016-09-11 LAB — BRAIN NATRIURETIC PEPTIDE: B Natriuretic Peptide: 170.1 pg/mL — ABNORMAL HIGH (ref 0.0–100.0)

## 2016-09-11 LAB — CBG MONITORING, ED: GLUCOSE-CAPILLARY: 106 mg/dL — AB (ref 65–99)

## 2016-09-11 MED ORDER — ASPIRIN 81 MG PO CHEW: 324.0000 mg | CHEWABLE_TABLET | Freq: Once | ORAL | Status: DC

## 2016-09-11 NOTE — ED Provider Notes (Signed)
[facility] DEPT Provider Note   CSN: [id number] Arrival date & time: 09/11/16  1333     History   Chief Complaint Chief Complaint  Patient presents with  . Tachycardia    HPI Alexis Winters is a 83 y.o. female.  HPI  Patient presents from nursing facility after an episode of chest pain.  Patient has multiple medical issues including dementia, but does answer questions briefly, seemingly appropriately. History provided by the patient and EMS providers. Patient recalls feeling sharp chest tightness, lightheadedness, and going to the nursing station for assistance. She seems to deny symptoms prior to this, and no nursing home report of recent other illness. Patient does have history of recurrent urinary tract infection, however. EMS notes that on arrival the patient had rapid rhythm, was found to have evidence for SVT on monitor, and received a dose of adenosine en route. Subsequently, the patient had returned sinus rhythm, and she currently denies any ongoing pain, complaints, lightheadedness, nausea. EMS notes that the patient remained in sinus rhythm after the initial intervention.   Past Medical History:  Diagnosis Date  . Allergic rhinitis   . Arrhythmia    a. office to call pt on 09/15/13 for 30 day event monitor  . Atrial fibrillation ([place])    a. h/o PAF, in NSR during admission 09/11/13  . Breast cancer ([place])   . Chronic diastolic CHF (congestive heart failure) ([address] [place])    a. echo 09/12/13 EF 56-38%, grade 1 diastolic dysf, mild LVOT obstr, moderate MR, elevated filling pressure  . Dementia   . Dyslipidemia   . Enlarged liver   . Frequent UTI   . GERD (gastroesophageal reflux disease)   . Hard of hearing   . Hemorrhoids   . Hypertension   . Hypoglycemia   . Mitral regurgitation    a. moderate MR by echo 09/12/2013  . OA (osteoarthritis)    hip and back  . Orthostatic hypotension   . Phlebitis    l leg  . Prediabetes     Patient Active Problem List   Diagnosis Date Noted  . SVT (supraventricular tachycardia) ([place]) 08/18/2016  . Dementia without behavioral disturbance 08/18/2016  . UTI (urinary tract infection) 05/29/2016  . Hypokalemia 04/28/2016  . Rash and nonspecific skin eruption 04/28/2016  . Bradycardia 04/27/2016  . Atrial flutter, paroxysmal ([place]) 06/22/2015  . Near syncope 01/10/2015  . Neck pain on left side 01/10/2015  . Pulmonary nodule, right 01/10/2015  . Leukocytosis 01/10/2015  . Hypocalcemia 01/10/2015  . Dyspnea 01/10/2015  . Chest pain 09/11/2013  . Bilateral neck pain 09/11/2013  . Angina at rest [facility]) 09/11/2013  . Leg edema 09/05/2013  . Failure to thrive 09/05/2013  . Chronic diastolic CHF (congestive heart failure) ([place]) 09/05/2013    Past Surgical History:  Procedure Laterality Date  . ABDOMINAL HYSTERECTOMY    . BLADDER SUSPENSION    . BREAST LUMPECTOMY    . CARDIAC CATHETERIZATION  1980's   [facility]  . FOOT SURGERY     left foot  . INNER EAR SURGERY      OB History    No data available       Home Medications    Prior to Admission medications   Medication Sig Start Date End Date Taking? Authorizing Provider  acetaminophen (TYLENOL) 500 MG tablet Take 500 mg by mouth every 4 (four) hours as needed for mild pain.    [provider]  alum & mag hydroxide-simeth ([place]) 200-200-20 MG/5ML suspension Take 30  mLs by mouth every 6 (six) hours as needed for indigestion or heartburn.    [provider]  aspirin EC 81 MG tablet Take 81 mg by mouth daily.    [provider]  cephALEXin (KEFLEX) 500 MG capsule Take 1 capsule (500 mg total) by mouth 2 (two) times daily. 09/09/16 09/16/16  Barry Dienes, NP  Cranberry 450 MG TABS Take 450 mg by mouth 2 (two) times daily.     [provider]  furosemide (LASIX) 40 MG tablet Take 40 mg by mouth 2 (two) times daily.  04/09/12   [provider]  glycopyrrolate (ROBINUL) 1 MG tablet Take 1 mg by mouth every 8 (eight) hours  as needed (IBS).     [provider]  guaifenesin (ROBITUSSIN) 100 MG/5ML syrup Take 200 mg by mouth every 6 (six) hours as needed for cough.    [provider]  ibuprofen (ADVIL,MOTRIN) 200 MG tablet Take 200 mg by mouth every 6 (six) hours as needed (back pain). Reported on 06/22/2015    [provider]  isosorbide mononitrate (IMDUR) 30 MG 24 hr tablet Take 1 tablet (30 mg total) by mouth daily. 10/13/15   Nahser, Wonda Cheng, MD  loperamide (IMODIUM) 2 MG capsule Take 2 mg by mouth daily as needed for diarrhea or loose stools.    [provider]  magnesium hydroxide (MILK OF MAGNESIA) 400 MG/5ML suspension Take 30 mLs by mouth at bedtime as needed for mild constipation.    [provider]  mirabegron ER (MYRBETRIQ) 25 MG TB24 tablet Take 25 mg by mouth daily.    [provider]  Neomycin-Bacitracin-Polymyxin (HCA TRIPLE ANTIBIOTIC OINTMENT EX) Apply 1 application topically daily as needed (skintear/abrasions).    [provider]  pantoprazole (PROTONIX) 40 MG tablet Take 40 mg by mouth daily. 10/19/14   [provider]  polyethylene glycol (MIRALAX / GLYCOLAX) packet Take 17 g by mouth daily as needed for moderate constipation. 04/29/16   Bhagat, Crista Luria, PA  potassium chloride (K-DUR) 10 MEQ tablet Take 2 tablets (20 mEq total) by mouth 3 (three) times daily. 06/21/16   Sherwood Gambler, MD    Family History Family History  Problem Relation Age of Onset  . Cerebral palsy Sister   . Alzheimer's disease Brother   . Hypertension Father   . Heart disease Brother     Social History Social History  Substance Use Topics  . Smoking status: Former Research scientist (life sciences)  . Smokeless tobacco: Never Used  . Alcohol use No     Allergies   Contrast media [iodinated diagnostic agents]; Metoprolol; Pyridium [phenazopyridine hcl]; Levaquin [levofloxacin in d5w]; Macrodantin [nitrofurantoin macrocrystal]; and Sulfa antibiotics   Review of  Systems Review of Systems  Unable to perform ROS: Dementia     Physical Exam Updated Vital Signs BP (!) 117/56   Pulse 84   Temp 97.9 F (36.6 C) (Oral)   Resp 18   Ht 5\' 3"  (1.6 m)   Wt 59 kg (130 lb)   SpO2 100%   BMI 23.03 kg/m   Physical Exam  Constitutional: She appears well-developed and well-nourished. No distress.  HENT:  Head: Normocephalic and atraumatic.  Eyes: Conjunctivae and EOM are normal.  Cardiovascular: Normal rate and regular rhythm.   Pulmonary/Chest: Effort normal and breath sounds normal. No stridor. No respiratory distress.  Abdominal: She exhibits no distension.  Musculoskeletal: She exhibits no edema.  Neurological: She is alert. She displays atrophy. No cranial nerve deficit.  Skin: Skin is  warm and dry.  Psychiatric: Cognition and memory are impaired.  Nursing note and vitals reviewed.    ED Treatments / Results  Labs (all labs ordered are listed, but only abnormal results are displayed) Labs Reviewed  COMPREHENSIVE METABOLIC PANEL - Abnormal; Notable for the following:       Result Value   Calcium 8.6 (*)    AST 44 (*)    GFR calc non Af Amer 57 (*)    All other components within normal limits  BRAIN NATRIURETIC PEPTIDE - Abnormal; Notable for the following:    B Natriuretic Peptide 170.1 (*)    All other components within normal limits  URINALYSIS, ROUTINE W REFLEX MICROSCOPIC - Abnormal; Notable for the following:    Color, Urine STRAW (*)    All other components within normal limits  CBG MONITORING, ED - Abnormal; Notable for the following:    Glucose-Capillary 106 (*)    All other components within normal limits  MAGNESIUM  TROPONIN I  CBC WITH DIFFERENTIAL/PLATELET  PROTIME-INR    EKG  EKG Interpretation  Date/Time:  Monday September 11 2016 13:41:36 EDT Ventricular Rate:  84 PR Interval:    QRS Duration: 79 QT Interval:  382 QTC Calculation: 452 R Axis:   2 Text Interpretation:  Sinus rhythm Probable left atrial  enlargement Abnormal R-wave progression, early transition Left ventricular hypertrophy Anterior Q waves, possibly due to LVH Artifact No significant change since last tracing Abnormal ekg Confirmed by Carmin Muskrat [phone]) on 09/11/2016 3:55:43 PM       Radiology Dg Chest Portable 1 View  Result Date: 09/11/2016 CLINICAL DATA:  Chest pain. EXAM: PORTABLE CHEST 1 VIEW COMPARISON:  06/21/2016 FINDINGS: Heart size and pulmonary vascularity are normal and the lungs are clear. Calcification of the thoracic aorta. Large hiatal hernia. Degenerative changes of both shoulders. Surgical clips in the left breast. IMPRESSION: No acute abnormality. Large hiatal hernia. Aortic atherosclerosis. Electronically Signed   By: Lorriane Shire M.D.   On: 09/11/2016 13:56    Procedures Procedures (including critical care time)  Medications Ordered in ED Medications  aspirin chewable tablet 324 mg (324 mg Oral Not Given 09/11/16 1410)     Initial Impression / Assessment and Plan / ED Course  I have reviewed the triage vital signs and the nursing notes.  Pertinent labs & imaging results that were available during my care of the patient were reviewed by me and considered in my medical decision making (see chart for details).   Labs reassuring, and for hours of monitoring the patient has no evidence for return of arrhythmia, no new chest pain, or any other complaints. With reassuring findings, suspicion for ataxic SVT, which the patient has had previously, the patient was discharged to her nursing facility, with close outpatient cartilage follow-up.  Final Clinical Impressions(s) / ED Diagnoses  Chest pain   Carmin Muskrat, MD 09/11/16 1635

## 2016-09-11 NOTE — ED Notes (Signed)
cbg was 106

## 2016-09-11 NOTE — ED Triage Notes (Signed)
To Ed via [facility] from [facility]-- with c/o heart rate at 180-- converted to RSR with Adenosine 6mg  per EMS. Pt lives in memory care unit, has some confusion. At baseline.

## 2016-09-11 NOTE — Discharge Instructions (Signed)
As discussed, your evaluation today has been largely reassuring.  But, it is important that you monitor your condition carefully, and do not hesitate to return to the ED if you develop new, or concerning changes in your condition. ? ?Otherwise, please follow-up with your physician for appropriate ongoing care. ? ?

## 2016-09-12 LAB — URINE CULTURE: Culture: >=100000 — AB

## 2016-10-26 ENCOUNTER — Emergency Department (HOSPITAL_COMMUNITY)
Admission: EM | Admit: 2016-10-26 | Discharge: 2016-10-26 | Disposition: A | Discharge status: Home / Self Care | Payer: Medicare Other | Attending: Emergency Medicine | Admitting: Emergency Medicine

## 2016-10-26 ENCOUNTER — Emergency Department (HOSPITAL_COMMUNITY): Payer: Medicare Other

## 2016-10-26 ENCOUNTER — Encounter (HOSPITAL_COMMUNITY): Payer: Self-pay | Admitting: Emergency Medicine

## 2016-10-26 DIAGNOSIS — Z7982 Long term (current) use of aspirin: Secondary | ICD-10-CM | POA: Insufficient documentation

## 2016-10-26 DIAGNOSIS — I471 Supraventricular tachycardia, unspecified: Secondary | ICD-10-CM

## 2016-10-26 DIAGNOSIS — Z853 Personal history of malignant neoplasm of breast: Secondary | ICD-10-CM | POA: Insufficient documentation

## 2016-10-26 DIAGNOSIS — Z87891 Personal history of nicotine dependence: Secondary | ICD-10-CM | POA: Diagnosis not present

## 2016-10-26 DIAGNOSIS — Z79899 Other long term (current) drug therapy: Secondary | ICD-10-CM | POA: Diagnosis not present

## 2016-10-26 DIAGNOSIS — I5032 Chronic diastolic (congestive) heart failure: Secondary | ICD-10-CM | POA: Diagnosis not present

## 2016-10-26 DIAGNOSIS — R079 Chest pain, unspecified: Secondary | ICD-10-CM | POA: Diagnosis present

## 2016-10-26 DIAGNOSIS — I110 Hypertensive heart disease with heart failure: Secondary | ICD-10-CM | POA: Insufficient documentation

## 2016-10-26 LAB — BASIC METABOLIC PANEL
Anion gap: 11 (ref 5–15)
BUN: 22 mg/dL — ABNORMAL HIGH (ref 6–20)
CALCIUM: 8.9 mg/dL (ref 8.9–10.3)
CO2: 26 mmol/L (ref 22–32)
Chloride: 101 mmol/L (ref 101–111)
Creatinine, Ser: 0.92 mg/dL (ref 0.44–1.00)
GFR calc Af Amer: >60 mL/min (ref >60)
GFR, EST NON AFRICAN AMERICAN: 54 mL/min — AB (ref >60)
GLUCOSE: 132 mg/dL — AB (ref 65–99)
Potassium: 3.3 mmol/L — ABNORMAL LOW (ref 3.5–5.1)
Sodium: 138 mmol/L (ref 135–145)

## 2016-10-26 LAB — CBC
HCT: 43.5 % (ref 36.0–46.0)
Hemoglobin: 14.6 g/dL (ref 12.0–15.0)
MCH: 28.9 pg (ref 26.0–34.0)
MCHC: 33.6 g/dL (ref 30.0–36.0)
MCV: 86.1 fL (ref 78.0–100.0)
Platelets: 138 10*3/uL — ABNORMAL LOW (ref 150–400)
RBC: 5.05 MIL/uL (ref 3.87–5.11)
RDW: 13.1 % (ref 11.5–15.5)
WBC: 5.6 10*3/uL (ref 4.0–10.5)

## 2016-10-26 LAB — I-STAT TROPONIN, ED: TROPONIN I, POC: 0.03 ng/mL (ref 0.00–0.08)

## 2016-10-26 NOTE — ED Provider Notes (Signed)
[facility] DEPT Provider Note   CSN: [id number] Arrival date & time: 10/26/16  2033     History   Chief Complaint Chief Complaint  Patient presents with  . Chest Pain    HPI Alexis Winters is a 83 y.o. female.  HPI Patient has history of paroxysmal SVT. She reports tonight she was changing and putting on her pajamas, she leaned over and suddenly she got a flop in her chest and felt her heart racing and chest pain. She sat down temporarily and then was able to call for assistance. EMS was called. During transport she spontaneously converted from SVT to sinus rhythm. Patient's daughter reports this is happened on multiple occasions. They report she has had this condition and usually once it resolves she stays in a normal rhythm for some time. It may happen Several times a month. Patient's daughter reports that she is not a candidate for a pacemaker or ablation due to medical comorbidities. Past Medical History:  Diagnosis Date  . Allergic rhinitis   . Arrhythmia    a. office to call pt on 09/15/13 for 30 day event monitor  . Atrial fibrillation ([place])    a. h/o PAF, in NSR during admission 09/11/13  . Breast cancer ([place])   . Chronic diastolic CHF (congestive heart failure) ([place])    a. echo 09/12/13 EF 06-23%, grade 1 diastolic dysf, mild LVOT obstr, moderate MR, elevated filling pressure  . Dementia   . Dyslipidemia   . Enlarged liver   . Frequent UTI   . GERD (gastroesophageal reflux disease)   . Hard of hearing   . Hemorrhoids   . Hypertension   . Hypoglycemia   . Mitral regurgitation    a. moderate MR by echo 09/12/2013  . OA (osteoarthritis)    hip and back  . Orthostatic hypotension   . Phlebitis    l leg  . Prediabetes     Patient Active Problem List   Diagnosis Date Noted  . SVT (supraventricular tachycardia) ([place]) 08/18/2016  . Dementia without behavioral disturbance 08/18/2016  . UTI (urinary tract infection) 05/29/2016  . Hypokalemia 04/28/2016  . Rash  and nonspecific skin eruption 04/28/2016  . Bradycardia 04/27/2016  . Atrial flutter, paroxysmal ([place]) 06/22/2015  . Near syncope 01/10/2015  . Neck pain on left side 01/10/2015  . Pulmonary nodule, right 01/10/2015  . Leukocytosis 01/10/2015  . Hypocalcemia 01/10/2015  . Dyspnea 01/10/2015  . Chest pain 09/11/2013  . Bilateral neck pain 09/11/2013  . Angina at rest [facility]) 09/11/2013  . Leg edema 09/05/2013  . Failure to thrive 09/05/2013  . Chronic diastolic CHF (congestive heart failure) ([place]) 09/05/2013    Past Surgical History:  Procedure Laterality Date  . ABDOMINAL HYSTERECTOMY    . BLADDER SUSPENSION    . BREAST LUMPECTOMY    . CARDIAC CATHETERIZATION  1980's   [facility]  . FOOT SURGERY     left foot  . INNER EAR SURGERY      OB History    No data available       Home Medications    Prior to Admission medications   Medication Sig Start Date End Date Taking? Authorizing Provider  acetaminophen (TYLENOL) 500 MG tablet Take 500 mg by mouth every 4 (four) hours as needed for mild pain, fever or headache.    Yes [provider]  alum & mag hydroxide-simeth ([place]) 200-200-20 MG/5ML suspension Take 30 mLs by mouth every 6 (six) hours as needed for indigestion or heartburn.  Yes [provider]  aspirin EC 81 MG tablet Take 81 mg by mouth daily.   Yes [provider]  Cranberry 450 MG TABS Take 450 mg by mouth 2 (two) times daily.    Yes [provider]  furosemide (LASIX) 40 MG tablet Take 40 mg by mouth 2 (two) times daily.  04/09/12  Yes [provider]  guaifenesin (ROBITUSSIN) 100 MG/5ML syrup Take 200 mg by mouth every 6 (six) hours as needed for cough.   Yes [provider]  ibuprofen (ADVIL,MOTRIN) 200 MG tablet Take 200 mg by mouth every 6 (six) hours as needed (back pain). Reported on 06/22/2015   Yes [provider]  isosorbide mononitrate (IMDUR) 30 MG 24 hr tablet Take 1 tablet (30 mg total) by mouth  daily. 10/13/15  Yes Nahser, Wonda Cheng, MD  loperamide (IMODIUM) 2 MG capsule Take 2 mg by mouth daily as needed for diarrhea or loose stools.   Yes [provider]  magnesium hydroxide (MILK OF MAGNESIA) 400 MG/5ML suspension Take 30 mLs by mouth at bedtime as needed for mild constipation.   Yes [provider]  mirabegron ER (MYRBETRIQ) 25 MG TB24 tablet Take 25 mg by mouth daily.   Yes [provider]  Neomycin-Bacitracin-Polymyxin (HCA TRIPLE ANTIBIOTIC OINTMENT EX) Apply 1 application topically daily as needed (skin tears/abrasions).    Yes [provider]  pantoprazole (PROTONIX) 40 MG tablet Take 40 mg by mouth daily. 10/19/14  Yes [provider]  potassium chloride (K-DUR) 10 MEQ tablet Take 2 tablets (20 mEq total) by mouth 3 (three) times daily. 06/21/16  Yes Sherwood Gambler, MD  vitamin C (ASCORBIC ACID) 500 MG tablet Take 500 mg by mouth daily.   Yes [provider]  polyethylene glycol (MIRALAX / GLYCOLAX) packet Take 17 g by mouth daily as needed for moderate constipation. Patient not taking: Reported on 10/26/2016 04/29/16   Leanor Kail, PA    Family History Family History  Problem Relation Age of Onset  . Cerebral palsy Sister   . Alzheimer's disease Brother   . Hypertension Father   . Heart disease Brother     Social History Social History  Substance Use Topics  . Smoking status: Former Research scientist (life sciences)  . Smokeless tobacco: Never Used  . Alcohol use No     Allergies   Contrast media [iodinated diagnostic agents]; Metoprolol; Pyridium [phenazopyridine hcl]; Levaquin [levofloxacin in d5w]; Macrodantin [nitrofurantoin macrocrystal]; Other; Tape; and Sulfa antibiotics   Review of Systems Review of Systems 10 Systems reviewed and are negative for acute change except as noted in the HPI.  Physical Exam Updated Vital Signs BP (!) 138/58   Pulse 67   Temp 98.6 F (37 C) (Oral)   Resp 17   SpO2 98%   Physical Exam    Constitutional: She is oriented to person, place, and time. She appears well-developed and well-nourished. No distress.  Patient is very good physical condition for age. She is alert and nontoxic. No respiratory distress.  HENT:  Head: Normocephalic and atraumatic.  Mouth/Throat: Oropharynx is clear and moist.  Eyes: Conjunctivae and EOM are normal.  Neck: Neck supple.  Cardiovascular: Normal rate, regular rhythm, normal heart sounds and intact distal pulses.   No murmur heard. Pulmonary/Chest: Effort normal and breath sounds normal. No respiratory distress.  Abdominal: Soft. There is no tenderness.  Musculoskeletal: She exhibits edema.  1+ pitting edema. Patient's daughter reports this is baseline.  Neurological: She is alert and oriented to person,  place, and time. No cranial nerve deficit. She exhibits normal muscle tone. Coordination normal.  Skin: Skin is warm and dry.  Psychiatric: She has a normal mood and affect.  Nursing note and vitals reviewed.    ED Treatments / Results  Labs (all labs ordered are listed, but only abnormal results are displayed) Labs Reviewed  BASIC METABOLIC PANEL - Abnormal; Notable for the following:       Result Value   Potassium 3.3 (*)    Glucose, Bld 132 (*)    BUN 22 (*)    GFR calc non Af Amer 54 (*)    All other components within normal limits  CBC - Abnormal; Notable for the following:    Platelets 138 (*)    All other components within normal limits  I-STAT TROPONIN, ED    EKG  EKG Interpretation  Date/Time:  Thursday October 26 2016 20:43:29 EDT Ventricular Rate:  74 PR Interval:    QRS Duration: 115 QT Interval:  401 QTC Calculation: 445 R Axis:   18 Text Interpretation:  Sinus rhythm Nonspecific intraventricular conduction delay no change from previous Confirmed by Charlesetta Shanks [phone]) on 10/26/2016 10:57:51 PM       Radiology Dg Chest 2 View  Result Date: 10/26/2016 CLINICAL DATA:  Chest pain with increased heart  rate.  SVT. EXAM: CHEST  2 VIEW COMPARISON:  09/11/2016 FINDINGS: Normal heart size and pulmonary vascularity. No focal airspace disease or consolidation in the lungs. No blunting of costophrenic angles. No pneumothorax. Mediastinal contours appear intact. Azygos lobe. Calcification of the aorta. Degenerative changes in the spine and shoulders. Surgical clips in the left breast. IMPRESSION: No active cardiopulmonary disease. Electronically Signed   By: Lucienne Capers M.D.   On: 10/26/2016 22:49    Procedures Procedures (including critical care time)  Medications Ordered in ED Medications - No data to display   Initial Impression / Assessment and Plan / ED Course  I have reviewed the triage vital signs and the nursing notes.  Pertinent labs & imaging results that were available during my care of the patient were reviewed by me and considered in my medical decision making (see chart for details).      Final Clinical Impressions(s) / ED Diagnoses   Final diagnoses:  Paroxysmal SVT (supraventricular tachycardia) ([place])  Patient has had multiple similar recurrences of paroxysmal SVT. Her general condition right now is very good. She is discharged to the care of her daughter.  New Prescriptions New Prescriptions   No medications on file     Charlesetta Shanks, MD 10/26/16 2317

## 2016-10-26 NOTE — ED Notes (Signed)
RN gave report to Danton Sewer at [facility].  Pts daughter will transport pt back to facility.  Daughter took paperwork including discharge paperwork and DNR back to facility

## 2016-10-26 NOTE — ED Notes (Signed)
Pt daughter upset with NF because I did not let her back until pt was settled with EMS. States "who do I need to talk to in order to make sure this does not happen again"

## 2016-10-26 NOTE — ED Triage Notes (Signed)
Per EMS, pt was in the dayroom w/ the other residents at [facility], when she began to complain of chest pain, heart rate was 170-180 and she was in SVT.  EMS reports she was in the ambulance when she converted w/o any intervention.  Upon arrival she had a heart rate of 74 and bp of 132/60.

## 2016-11-05 ENCOUNTER — Other Ambulatory Visit: Payer: Self-pay

## 2016-11-05 ENCOUNTER — Emergency Department (HOSPITAL_COMMUNITY)
Admission: EM | Admit: 2016-11-05 | Discharge: 2016-11-05 | Disposition: A | Discharge status: Home / Self Care | Payer: Medicare Other | Attending: Emergency Medicine | Admitting: Emergency Medicine

## 2016-11-05 ENCOUNTER — Emergency Department (HOSPITAL_COMMUNITY): Payer: Medicare Other

## 2016-11-05 ENCOUNTER — Encounter (HOSPITAL_COMMUNITY): Payer: Self-pay | Admitting: Pharmacy Technician

## 2016-11-05 DIAGNOSIS — F0390 Unspecified dementia without behavioral disturbance: Secondary | ICD-10-CM | POA: Diagnosis not present

## 2016-11-05 DIAGNOSIS — Z7982 Long term (current) use of aspirin: Secondary | ICD-10-CM | POA: Insufficient documentation

## 2016-11-05 DIAGNOSIS — Z79899 Other long term (current) drug therapy: Secondary | ICD-10-CM | POA: Insufficient documentation

## 2016-11-05 DIAGNOSIS — I5032 Chronic diastolic (congestive) heart failure: Secondary | ICD-10-CM | POA: Insufficient documentation

## 2016-11-05 DIAGNOSIS — R079 Chest pain, unspecified: Secondary | ICD-10-CM

## 2016-11-05 DIAGNOSIS — I471 Supraventricular tachycardia: Secondary | ICD-10-CM

## 2016-11-05 DIAGNOSIS — Z87891 Personal history of nicotine dependence: Secondary | ICD-10-CM | POA: Insufficient documentation

## 2016-11-05 DIAGNOSIS — I110 Hypertensive heart disease with heart failure: Secondary | ICD-10-CM | POA: Insufficient documentation

## 2016-11-05 LAB — BASIC METABOLIC PANEL
ANION GAP: 11 (ref 5–15)
BUN: 23 mg/dL — ABNORMAL HIGH (ref 6–20)
CALCIUM: 9 mg/dL (ref 8.9–10.3)
CO2: 25 mmol/L (ref 22–32)
Chloride: 104 mmol/L (ref 101–111)
Creatinine, Ser: 0.85 mg/dL (ref 0.44–1.00)
GFR calc non Af Amer: 60 mL/min — ABNORMAL LOW (ref >60)
Glucose, Bld: 96 mg/dL (ref 65–99)
Potassium: 3.6 mmol/L (ref 3.5–5.1)
SODIUM: 140 mmol/L (ref 135–145)

## 2016-11-05 LAB — URINALYSIS, ROUTINE W REFLEX MICROSCOPIC
BILIRUBIN URINE: NEGATIVE
GLUCOSE, UA: NEGATIVE mg/dL
Hgb urine dipstick: NEGATIVE
Ketones, ur: NEGATIVE mg/dL
LEUKOCYTES UA: NEGATIVE
NITRITE: NEGATIVE
PH: 7 (ref 5.0–8.0)
PROTEIN: NEGATIVE mg/dL
Specific Gravity, Urine: 1.005 (ref 1.005–1.030)

## 2016-11-05 LAB — CBC
HCT: 43.4 % (ref 36.0–46.0)
HEMOGLOBIN: 14.8 g/dL (ref 12.0–15.0)
MCH: 29.2 pg (ref 26.0–34.0)
MCHC: 34.1 g/dL (ref 30.0–36.0)
MCV: 85.6 fL (ref 78.0–100.0)
PLATELETS: 180 10*3/uL (ref 150–400)
RBC: 5.07 MIL/uL (ref 3.87–5.11)
RDW: 13.2 % (ref 11.5–15.5)
WBC: 7.1 10*3/uL (ref 4.0–10.5)

## 2016-11-05 LAB — I-STAT TROPONIN, ED: Troponin i, poc: 0.02 ng/mL (ref 0.00–0.08)

## 2016-11-05 NOTE — ED Triage Notes (Signed)
Pt arrives via [facility] from [facility] with reports of sudden onset CP. EMS found pt to have HR 150-170. Pt with hx of the same. Pt converted to normal rhythm on her own.

## 2016-11-05 NOTE — ED Notes (Signed)
ED Provider at bedside. 

## 2016-11-05 NOTE — ED Notes (Signed)
Dr. Rex Kras made aware of multiple failed attempts at blood collection. Philippa Chester aware of need for phlebotomist to collect labs.

## 2016-11-05 NOTE — ED Provider Notes (Signed)
[facility] DEPT Provider Note   CSN: [id number] Arrival date & time: 11/05/16  1558     History   Chief Complaint Chief Complaint  Patient presents with  . Chest Pain    HPI Alexis Winters is a 83 y.o. female.  83yo F w/ PMH including A fib, recurrent episodes of SVT, dementia, CHF, mitral regurg, HTN who p/w chest pain. Daughter reports that [name] home reported an episode of chest pain today that is her typical SVT episode. They have a protocol for ice packs and other vagal maneuvers which they did. Another episode happened and they eventually called EMS. She described sharp left chest pain that resolved prior to getting on ambulance, which daughter states often happens when she converts. She is currently on antibiotics for UTI which were started ~6 days ago. Daughter reports no vomiting, diarrhea, altered behavior, or appetite changes. Her LE edema is at baseline.  Of note, patient has been to ED multiple times this year with similar presentations of CP and SVT, often with spontaneous conversion to sinus rhythm. She has followed with cardiology and tried metoprolol XL in the past but had to discontinue due to bradycardia.  LEVEL 5 CAVEAT DUE TO DEMENTIA   The history is provided by the patient, the EMS personnel and the nursing home.  Chest Pain      Past Medical History:  Diagnosis Date  . Allergic rhinitis   . Arrhythmia    a. office to call pt on 09/15/13 for 30 day event monitor  . Atrial fibrillation ([place])    a. h/o PAF, in NSR during admission 09/11/13  . Breast cancer ([place])   . Chronic diastolic CHF (congestive heart failure) ([place])    a. echo 09/12/13 EF 07-12%, grade 1 diastolic dysf, mild LVOT obstr, moderate MR, elevated filling pressure  . Dementia   . Dyslipidemia   . Enlarged liver   . Frequent UTI   . GERD (gastroesophageal reflux disease)   . Hard of hearing   . Hemorrhoids   . Hypertension   . Hypoglycemia   . Mitral regurgitation    a.  moderate MR by echo 09/12/2013  . OA (osteoarthritis)    hip and back  . Orthostatic hypotension   . Phlebitis    l leg  . Prediabetes     Patient Active Problem List   Diagnosis Date Noted  . SVT (supraventricular tachycardia) ([place]) 08/18/2016  . Dementia without behavioral disturbance 08/18/2016  . UTI (urinary tract infection) 05/29/2016  . Hypokalemia 04/28/2016  . Rash and nonspecific skin eruption 04/28/2016  . Bradycardia 04/27/2016  . Atrial flutter, paroxysmal ([place]) 06/22/2015  . Near syncope 01/10/2015  . Neck pain on left side 01/10/2015  . Pulmonary nodule, right 01/10/2015  . Leukocytosis 01/10/2015  . Hypocalcemia 01/10/2015  . Dyspnea 01/10/2015  . Chest pain 09/11/2013  . Bilateral neck pain 09/11/2013  . Angina at rest [facility]) 09/11/2013  . Leg edema 09/05/2013  . Failure to thrive 09/05/2013  . Chronic diastolic CHF (congestive heart failure) ([place]) 09/05/2013    Past Surgical History:  Procedure Laterality Date  . ABDOMINAL HYSTERECTOMY    . BLADDER SUSPENSION    . BREAST LUMPECTOMY    . CARDIAC CATHETERIZATION  1980's   [facility]  . FOOT SURGERY     left foot  . INNER EAR SURGERY      OB History    No data available       Home Medications    Prior  to Admission medications   Medication Sig Start Date End Date Taking? Authorizing Provider  acetaminophen (TYLENOL) 500 MG tablet Take 500 mg by mouth every 4 (four) hours as needed for mild pain, fever or headache.     [provider]  alum & mag hydroxide-simeth ([place]) 200-200-20 MG/5ML suspension Take 30 mLs by mouth every 6 (six) hours as needed for indigestion or heartburn.    [provider]  aspirin EC 81 MG tablet Take 81 mg by mouth daily.    [provider]  Cranberry 450 MG TABS Take 450 mg by mouth 2 (two) times daily.     [provider]  furosemide (LASIX) 40 MG tablet Take 40 mg by mouth 2 (two) times daily.  04/09/12   [provider]    guaifenesin (ROBITUSSIN) 100 MG/5ML syrup Take 200 mg by mouth every 6 (six) hours as needed for cough.    [provider]  ibuprofen (ADVIL,MOTRIN) 200 MG tablet Take 200 mg by mouth every 6 (six) hours as needed (back pain). Reported on 06/22/2015    [provider]  isosorbide mononitrate (IMDUR) 30 MG 24 hr tablet Take 1 tablet (30 mg total) by mouth daily. 10/13/15   Nahser, Wonda Cheng, MD  loperamide (IMODIUM) 2 MG capsule Take 2 mg by mouth daily as needed for diarrhea or loose stools.    [provider]  magnesium hydroxide (MILK OF MAGNESIA) 400 MG/5ML suspension Take 30 mLs by mouth at bedtime as needed for mild constipation.    [provider]  mirabegron ER (MYRBETRIQ) 25 MG TB24 tablet Take 25 mg by mouth daily.    [provider]  Neomycin-Bacitracin-Polymyxin (HCA TRIPLE ANTIBIOTIC OINTMENT EX) Apply 1 application topically daily as needed (skin tears/abrasions).     [provider]  pantoprazole (PROTONIX) 40 MG tablet Take 40 mg by mouth daily. 10/19/14   [provider]  polyethylene glycol (MIRALAX / GLYCOLAX) packet Take 17 g by mouth daily as needed for moderate constipation. Patient not taking: Reported on 10/26/2016 04/29/16   Leanor Kail, PA  potassium chloride (K-DUR) 10 MEQ tablet Take 2 tablets (20 mEq total) by mouth 3 (three) times daily. 06/21/16   Sherwood Gambler, MD  vitamin C (ASCORBIC ACID) 500 MG tablet Take 500 mg by mouth daily.    [provider]    Family History Family History  Problem Relation Age of Onset  . Cerebral palsy Sister   . Alzheimer's disease Brother   . Hypertension Father   . Heart disease Brother     Social History Social History  Substance Use Topics  . Smoking status: Former Research scientist (life sciences)  . Smokeless tobacco: Never Used  . Alcohol use No     Allergies   Contrast media [iodinated diagnostic agents]; Metoprolol; Pyridium [phenazopyridine hcl]; Levaquin  [levofloxacin in d5w]; Macrodantin [nitrofurantoin macrocrystal]; Other; Tape; and Sulfa antibiotics   Review of Systems Review of Systems  Unable to perform ROS: Dementia  Cardiovascular: Positive for chest pain.     Physical Exam Updated Vital Signs BP 139/62   Pulse 64   Temp 98.3 F (36.8 C) (Oral)   Resp 13   SpO2 100%   Physical Exam  Constitutional: She appears well-developed. No distress.  Thin, frail, elderly woman awake and comfortable  HENT:  Head: Normocephalic and atraumatic.  Moist mucous membranes  Eyes: Pupils are equal, round, and reactive to light. Conjunctivae are normal.  Neck: Neck supple.  Cardiovascular: Normal rate and regular  rhythm.   Murmur heard. Pulmonary/Chest: Effort normal and breath sounds normal.  Abdominal: Soft. Bowel sounds are normal. She exhibits no distension. There is tenderness (mild suprapubic). There is no rebound and no guarding.  Musculoskeletal: She exhibits edema (3+ pitting BLE edema to knees).  Neurological: She is alert.  Oriented to person, moving all 4 extremities  Skin: Skin is warm and dry.  Psychiatric:  Calm, cooperative  Nursing note and vitals reviewed.    ED Treatments / Results  Labs (all labs ordered are listed, but only abnormal results are displayed) Labs Reviewed  BASIC METABOLIC PANEL - Abnormal; Notable for the following:       Result Value   BUN 23 (*)    GFR calc non Af Amer 60 (*)    All other components within normal limits  URINALYSIS, ROUTINE W REFLEX MICROSCOPIC - Abnormal; Notable for the following:    Color, Urine COLORLESS (*)    All other components within normal limits  URINE CULTURE  CBC  I-STAT TROPONIN, ED    EKG  EKG Interpretation  Date/Time:  Sunday November 05 2016 15:48:41 EDT Ventricular Rate:  77 PR Interval:  194 QRS Duration: 70 QT Interval:  382 QTC Calculation: 432 R Axis:   16 Text Interpretation:  Normal sinus rhythm RSR' or QR pattern in V1 suggests right  ventricular conduction delay Borderline ECG No significant change since last tracing Confirmed by Theotis Burrow [phone]) on 11/05/2016 4:15:30 PM       Radiology Dg Chest 2 View  Result Date: 11/05/2016 CLINICAL DATA:  Sudden chest pain with SVT EXAM: CHEST  2 VIEW COMPARISON:  10/26/2016 FINDINGS: Surgical clips over the left breast. Slight elevation of left diaphragm as before. No acute consolidation or effusion. Heart size upper normal. Aortic atherosclerosis. No pneumothorax. Moderate hiatal hernia. IMPRESSION: 1. Borderline cardiomegaly.  No edema or infiltrate 2. Moderate hiatal hernia Electronically Signed   By: Donavan Foil M.D.   On: 11/05/2016 18:22    Procedures Procedures (including critical care time)  Medications Ordered in ED Medications - No data to display   Initial Impression / Assessment and Plan / ED Course  I have reviewed the triage vital signs and the nursing notes.  Pertinent labs & imaging results that were available during my care of the patient were reviewed by me and considered in my medical decision making (see chart for details).    PT w/ h/o Paroxysmal SVT who presents with episode of chest pain and SVT noted by nursing facility and EMS, resolved prior to arrival. She was comfortable on my exam with EKG showing sinus rhythm in the 70s, no ischemic changes. She denied any complaints and specifically denied any feeling of heart racing. Daughter notes this is exactly like previous episodes of SVT. Her screening lab work is reassuring including negative troponin and chest x-ray negative acute. I did check a UA because patient had mild suprapubic tenderness on exam although she denied any abdominal pain at rest. She has been on antibiotics recently. UA is unremarkable and I instructed to finish her current antibiotic course as it appears to be resolving her infection. Given otherwise unremarkable labs, well appearance, and no other symptoms, I doubt any  life-threatening abdominal process. Regarding her SVT, her last cardiology note states that they have chosen medical/supportive management at this time. I recommended that they contact the clinic for a follow-up appointment. Reviewed return precautions with the patient's daughter. She voiced understanding and patient was discharged in  satisfactory condition.  Final Clinical Impressions(s) / ED Diagnoses   Final diagnoses:  Nonspecific chest pain  Paroxysmal SVT (supraventricular tachycardia) [facility])    New Prescriptions Discharge Medication List as of 11/05/2016  7:33 PM       Briceson Broadwater, Wenda Overland, MD 11/06/16 [phone]

## 2016-11-06 LAB — URINE CULTURE
Culture: NO GROWTH
SPECIAL REQUESTS: NORMAL

## 2016-11-14 ENCOUNTER — Encounter (HOSPITAL_COMMUNITY): Payer: Self-pay | Admitting: Emergency Medicine

## 2016-11-14 ENCOUNTER — Inpatient Hospital Stay (HOSPITAL_COMMUNITY)
Admission: EM | Admit: 2016-11-14 | Discharge: 2016-11-16 | DRG: 069 | Disposition: A | Discharge status: Nursing Home (Includes ICF) | Payer: Medicare Other | Attending: Internal Medicine | Admitting: Internal Medicine

## 2016-11-14 DIAGNOSIS — Z888 Allergy status to other drugs, medicaments and biological substances status: Secondary | ICD-10-CM

## 2016-11-14 DIAGNOSIS — G459 Transient cerebral ischemic attack, unspecified: Secondary | ICD-10-CM | POA: Diagnosis not present

## 2016-11-14 DIAGNOSIS — I672 Cerebral atherosclerosis: Secondary | ICD-10-CM | POA: Diagnosis present

## 2016-11-14 DIAGNOSIS — F0391 Unspecified dementia with behavioral disturbance: Secondary | ICD-10-CM | POA: Diagnosis present

## 2016-11-14 DIAGNOSIS — Z79899 Other long term (current) drug therapy: Secondary | ICD-10-CM

## 2016-11-14 DIAGNOSIS — I4891 Unspecified atrial fibrillation: Secondary | ICD-10-CM | POA: Diagnosis present

## 2016-11-14 DIAGNOSIS — F0390 Unspecified dementia without behavioral disturbance: Secondary | ICD-10-CM | POA: Diagnosis present

## 2016-11-14 DIAGNOSIS — H9190 Unspecified hearing loss, unspecified ear: Secondary | ICD-10-CM | POA: Diagnosis present

## 2016-11-14 DIAGNOSIS — E785 Hyperlipidemia, unspecified: Secondary | ICD-10-CM | POA: Diagnosis present

## 2016-11-14 DIAGNOSIS — Z853 Personal history of malignant neoplasm of breast: Secondary | ICD-10-CM

## 2016-11-14 DIAGNOSIS — R42 Dizziness and giddiness: Secondary | ICD-10-CM

## 2016-11-14 DIAGNOSIS — Z7982 Long term (current) use of aspirin: Secondary | ICD-10-CM

## 2016-11-14 DIAGNOSIS — I110 Hypertensive heart disease with heart failure: Secondary | ICD-10-CM | POA: Diagnosis present

## 2016-11-14 DIAGNOSIS — E876 Hypokalemia: Secondary | ICD-10-CM | POA: Diagnosis present

## 2016-11-14 DIAGNOSIS — Z66 Do not resuscitate: Secondary | ICD-10-CM | POA: Diagnosis present

## 2016-11-14 DIAGNOSIS — I5032 Chronic diastolic (congestive) heart failure: Secondary | ICD-10-CM | POA: Diagnosis present

## 2016-11-14 DIAGNOSIS — Z881 Allergy status to other antibiotic agents status: Secondary | ICD-10-CM

## 2016-11-14 DIAGNOSIS — R7303 Prediabetes: Secondary | ICD-10-CM | POA: Diagnosis present

## 2016-11-14 DIAGNOSIS — R531 Weakness: Secondary | ICD-10-CM | POA: Diagnosis not present

## 2016-11-14 DIAGNOSIS — Z91041 Radiographic dye allergy status: Secondary | ICD-10-CM

## 2016-11-14 DIAGNOSIS — M1990 Unspecified osteoarthritis, unspecified site: Secondary | ICD-10-CM | POA: Diagnosis present

## 2016-11-14 DIAGNOSIS — E869 Volume depletion, unspecified: Secondary | ICD-10-CM | POA: Diagnosis present

## 2016-11-14 DIAGNOSIS — I081 Rheumatic disorders of both mitral and tricuspid valves: Secondary | ICD-10-CM | POA: Diagnosis present

## 2016-11-14 DIAGNOSIS — K219 Gastro-esophageal reflux disease without esophagitis: Secondary | ICD-10-CM | POA: Diagnosis present

## 2016-11-14 DIAGNOSIS — Z87440 Personal history of urinary (tract) infections: Secondary | ICD-10-CM

## 2016-11-14 DIAGNOSIS — Z87891 Personal history of nicotine dependence: Secondary | ICD-10-CM

## 2016-11-14 DIAGNOSIS — I480 Paroxysmal atrial fibrillation: Secondary | ICD-10-CM | POA: Diagnosis present

## 2016-11-14 LAB — COMPREHENSIVE METABOLIC PANEL
ALT: 18 U/L (ref 14–54)
AST: 27 U/L (ref 15–41)
Albumin: 3.4 g/dL — ABNORMAL LOW (ref 3.5–5.0)
Alkaline Phosphatase: 84 U/L (ref 38–126)
Anion gap: 8 (ref 5–15)
BUN: 15 mg/dL (ref 6–20)
CO2: 28 mmol/L (ref 22–32)
Calcium: 9 mg/dL (ref 8.9–10.3)
Chloride: 106 mmol/L (ref 101–111)
Creatinine, Ser: 0.77 mg/dL (ref 0.44–1.00)
GFR calc Af Amer: >60 mL/min (ref >60)
GFR calc non Af Amer: >60 mL/min (ref >60)
Glucose, Bld: 119 mg/dL — ABNORMAL HIGH (ref 65–99)
Potassium: 3.1 mmol/L — ABNORMAL LOW (ref 3.5–5.1)
Sodium: 142 mmol/L (ref 135–145)
Total Bilirubin: 0.7 mg/dL (ref 0.3–1.2)
Total Protein: 6.3 g/dL — ABNORMAL LOW (ref 6.5–8.1)

## 2016-11-14 LAB — CBC WITH DIFFERENTIAL/PLATELET
Basophils Absolute: 0 10*3/uL (ref 0.0–0.1)
Basophils Relative: 0 %
Eosinophils Absolute: 0.2 10*3/uL (ref 0.0–0.7)
Eosinophils Relative: 3 %
HCT: 40.9 % (ref 36.0–46.0)
Hemoglobin: 13.7 g/dL (ref 12.0–15.0)
Lymphocytes Relative: 23 %
Lymphs Abs: 1.5 10*3/uL (ref 0.7–4.0)
MCH: 28.7 pg (ref 26.0–34.0)
MCHC: 33.5 g/dL (ref 30.0–36.0)
MCV: 85.7 fL (ref 78.0–100.0)
Monocytes Absolute: 0.5 10*3/uL (ref 0.1–1.0)
Monocytes Relative: 8 %
Neutro Abs: 4.2 10*3/uL (ref 1.7–7.7)
Neutrophils Relative %: 66 %
Platelets: 158 10*3/uL (ref 150–400)
RBC: 4.77 MIL/uL (ref 3.87–5.11)
RDW: 12.9 % (ref 11.5–15.5)
WBC: 6.3 10*3/uL (ref 4.0–10.5)

## 2016-11-14 LAB — LIPASE, BLOOD: Lipase: 36 U/L (ref 11–51)

## 2016-11-14 LAB — URINALYSIS, ROUTINE W REFLEX MICROSCOPIC
Bilirubin Urine: NEGATIVE
Glucose, UA: NEGATIVE mg/dL
Hgb urine dipstick: NEGATIVE
Ketones, ur: NEGATIVE mg/dL
Leukocytes, UA: NEGATIVE
Nitrite: NEGATIVE
Protein, ur: NEGATIVE mg/dL
Specific Gravity, Urine: 1.005 (ref 1.005–1.030)
pH: 8 (ref 5.0–8.0)

## 2016-11-14 NOTE — ED Notes (Signed)
Daughter is out in hallway to ask how long until someone sees pt.  Apologies given for wait, daughter reports pt was altered last night, had episodes of dizziness and vomiting but finally was able to sleep.  Daughter reports today, pt was back to baseline mental status but was told by staff that "pt looked horrible and was going to ER".

## 2016-11-14 NOTE — ED Triage Notes (Signed)
Pt in from [facility] with generalized weakness x 1 wk. Pt states she felt possible palpitations today, denies cp, dizziness or sob. Alert, given 324 ASA en route. CBG 107, BP 170/100, 99% RA.

## 2016-11-14 NOTE — ED Provider Notes (Signed)
[facility] DEPT Provider Note   CSN: [id number] Arrival date & time: 11/14/16  1801     History   Chief Complaint Chief Complaint  Patient presents with  . Weakness    HPI Alexis Winters is a 83 y.o. female.  HPI   Alexis Winters is a 83 y.o. female, with a history of A. Fib, CHF, dementia, recurrent UTI,, presenting to the ED with a report of an episode of dizziness that occurred yesterday. Patient's daughter states that she was contacted by the nursing home staff around 6pm that the patient wasn't feeling well. Patient told her daughter that she felt dizzy and had a headache. When the daughter spoke to the patient on the phone, daughter reports patient was saying things that she normally wouldn't like, "you don't care about me and you'll be glad when I'm gone." When the daughter came over to see the patient at about 10PM, the patient had vomiting once and was complaining of a headache and dizziness. Patient had a staggering gait. Patient symptoms resolved and patient went to sleep shortly thereafter.  Patient then woke up this morning for breakfast and complained of generalized weakness. The daughter called to check on the patient multiple times today and was told that the patient felt well.  Patient was sent to the ED because when the new shift of staff members went to see the patient, they said that she "didn't look right." Patient's daughters at the bedside state patient looks normal. Patient denies any current complaints.  They deny new medications for the patient. No LOC, CP, SOB, abdominal pain, cough, urinary symptoms, neuro deficits, or any other complaints.  Patient lives in the memory care unit of the nursing home. She usually ambulates with a walker and assistance.   Past Medical History:  Diagnosis Date  . Allergic rhinitis   . Arrhythmia    a. office to call pt on 09/15/13 for 30 day event monitor  . Atrial fibrillation ([place])    a. h/o PAF, in NSR during  admission 09/11/13  . Breast cancer ([place])   . Chronic diastolic CHF (congestive heart failure) ([place])    a. echo 09/12/13 EF 35-46%, grade 1 diastolic dysf, mild LVOT obstr, moderate MR, elevated filling pressure  . Dementia   . Dyslipidemia   . Enlarged liver   . Frequent UTI   . GERD (gastroesophageal reflux disease)   . Hard of hearing   . Hemorrhoids   . Hypertension   . Hypoglycemia   . Mitral regurgitation    a. moderate MR by echo 09/12/2013  . OA (osteoarthritis)    hip and back  . Orthostatic hypotension   . Phlebitis    l leg  . Prediabetes     Patient Active Problem List   Diagnosis Date Noted  . TIA (transient ischemic attack) 11/15/2016  . SVT (supraventricular tachycardia) ([place]) 08/18/2016  . Dementia without behavioral disturbance 08/18/2016  . UTI (urinary tract infection) 05/29/2016  . Hypokalemia 04/28/2016  . Rash and nonspecific skin eruption 04/28/2016  . Bradycardia 04/27/2016  . Atrial flutter, paroxysmal ([place]) 06/22/2015  . Near syncope 01/10/2015  . Neck pain on left side 01/10/2015  . Pulmonary nodule, right 01/10/2015  . Leukocytosis 01/10/2015  . Hypocalcemia 01/10/2015  . Dyspnea 01/10/2015  . Chest pain 09/11/2013  . Bilateral neck pain 09/11/2013  . Angina at rest [facility]) 09/11/2013  . Leg edema 09/05/2013  . Failure to thrive 09/05/2013  . Chronic diastolic CHF (congestive heart  failure) ([place]) 09/05/2013    Past Surgical History:  Procedure Laterality Date  . ABDOMINAL HYSTERECTOMY    . BLADDER SUSPENSION    . BREAST LUMPECTOMY    . CARDIAC CATHETERIZATION  1980's   [facility]  . FOOT SURGERY     left foot  . INNER EAR SURGERY      OB History    No data available       Home Medications    Prior to Admission medications   Medication Sig Start Date End Date Taking? Authorizing Provider  acetaminophen (TYLENOL) 500 MG tablet Take 500 mg by mouth every 4 (four) hours as needed for mild pain, fever or headache.    Yes [provider]  alum & mag hydroxide-simeth ([place]) 200-200-20 MG/5ML suspension Take 30 mLs by mouth every 6 (six) hours as needed for indigestion or heartburn.   Yes [provider]  aspirin EC 81 MG tablet Take 81 mg by mouth daily.   Yes [provider]  Cranberry 450 MG TABS Take 450 mg by mouth 2 (two) times daily.    Yes [provider]  furosemide (LASIX) 40 MG tablet Take 40 mg by mouth 2 (two) times daily.  04/09/12  Yes [provider]  guaifenesin (ROBITUSSIN) 100 MG/5ML syrup Take 200 mg by mouth every 6 (six) hours as needed for cough.   Yes [provider]  ibuprofen (ADVIL,MOTRIN) 200 MG tablet Take 200 mg by mouth every 6 (six) hours as needed (back pain). Reported on 06/22/2015   Yes [provider]  isosorbide mononitrate (IMDUR) 30 MG 24 hr tablet Take 1 tablet (30 mg total) by mouth daily. 10/13/15  Yes Nahser, Wonda Cheng, MD  loperamide (IMODIUM) 2 MG capsule Take 2 mg by mouth daily as needed for diarrhea or loose stools.   Yes [provider]  magnesium hydroxide (MILK OF MAGNESIA) 400 MG/5ML suspension Take 30 mLs by mouth at bedtime as needed for mild constipation.   Yes [provider]  methenamine (MANDELAMINE) 1 g tablet Take 1,000 mg by mouth 2 (two) times daily.   Yes [provider]  mirabegron ER (MYRBETRIQ) 25 MG TB24 tablet Take 25 mg by mouth daily.   Yes [provider]  Neomycin-Bacitracin-Polymyxin (HCA TRIPLE ANTIBIOTIC OINTMENT EX) Apply 1 application topically daily as needed (skin tears/abrasions).    Yes [provider]  pantoprazole (PROTONIX) 40 MG tablet Take 40 mg by mouth daily. 10/19/14  Yes [provider]  potassium chloride (K-DUR) 10 MEQ tablet Take 2 tablets (20 mEq total) by mouth 3 (three) times daily. 06/21/16  Yes Sherwood Gambler, MD  vitamin C (ASCORBIC ACID) 500 MG tablet Take 500 mg by mouth daily.   Yes [provider]    polyethylene glycol (MIRALAX / GLYCOLAX) packet Take 17 g by mouth daily as needed for moderate constipation. Patient not taking: Reported on 10/26/2016 04/29/16   Leanor Kail, PA    Family History Family History  Problem Relation Age of Onset  . Cerebral palsy Sister   . Alzheimer's disease Brother   . Hypertension Father   . Heart disease Brother     Social History Social History  Substance Use Topics  . Smoking status: Former Research scientist (life sciences)  . Smokeless tobacco: Never Used  . Alcohol use No     Allergies   Contrast media [iodinated diagnostic agents]; Metoprolol; Pyridium [phenazopyridine hcl]; Levaquin [levofloxacin in d5w]; Macrodantin [nitrofurantoin macrocrystal]; Other; Tape; and Sulfa antibiotics   Review  of Systems Review of Systems  Constitutional: Negative for chills, diaphoresis and fever.  Respiratory: Negative for shortness of breath.   Cardiovascular: Negative for chest pain.  Gastrointestinal: Positive for nausea (resolved) and vomiting (resolved). Negative for abdominal pain.  Genitourinary: Negative for dysuria, frequency and hematuria.  Musculoskeletal: Negative for back pain and neck pain.  Neurological: Positive for dizziness (resolved), weakness (generalized; resolved) and headaches (resolved). Negative for seizures, syncope, facial asymmetry and numbness.  All other systems reviewed and are negative.    Physical Exam Updated Vital Signs BP (!) 153/70 (BP Location: Right Arm)   Pulse 65   Temp 98.4 F (36.9 C) (Oral)   Resp 19   Ht 5\' 1"  (1.549 m)   Wt 59 kg (130 lb)   SpO2 98%   BMI 24.56 kg/m   Physical Exam  Constitutional: She appears well-developed and well-nourished. No distress.  HENT:  Head: Normocephalic and atraumatic.  Mouth/Throat: Oropharynx is clear and moist.  Eyes: Pupils are equal, round, and reactive to light. Conjunctivae and EOM are normal.  Neck: Normal range of motion. Neck supple.  Cardiovascular: Normal rate,  regular rhythm, normal heart sounds and intact distal pulses.   Pulmonary/Chest: Effort normal and breath sounds normal. No respiratory distress.  Abdominal: Soft. There is no tenderness. There is no guarding.  Musculoskeletal: She exhibits edema (bilateral lower extremities, chronic).  Normal motor function intact in all extremities and spine. No midline spinal tenderness.   Lymphadenopathy:    She has no cervical adenopathy.  Neurological: She is alert.  No sensory deficits. Strength 5/5 in all extremities. No upright ataxia. Cover-uncover test is normal. No onset of dizziness with head movement. Coordination intact including heel to shin and finger to nose. Cranial nerves III-XII grossly intact. No facial droop.   Skin: Skin is warm and dry. Capillary refill takes less than 2 seconds. She is not diaphoretic.  Psychiatric: She has a normal mood and affect. Her behavior is normal.  Nursing note and vitals reviewed.    ED Treatments / Results  Labs (all labs ordered are listed, but only abnormal results are displayed) Labs Reviewed  COMPREHENSIVE METABOLIC PANEL - Abnormal; Notable for the following:       Result Value   Potassium 3.1 (*)    Glucose, Bld 119 (*)    Total Protein 6.3 (*)    Albumin 3.4 (*)    All other components within normal limits  URINALYSIS, ROUTINE W REFLEX MICROSCOPIC - Abnormal; Notable for the following:    Color, Urine COLORLESS (*)    All other components within normal limits  CBC WITH DIFFERENTIAL/PLATELET  LIPASE, BLOOD    EKG  EKG Interpretation  Date/Time:  Tuesday November 14 2016 18:04:20 EDT Ventricular Rate:  66 PR Interval:    QRS Duration: 98 QT Interval:  416 QTC Calculation: 436 R Axis:   23 Text Interpretation:  Sinus rhythm Abnormal R-wave progression, early transition No significant change since last tracing Confirmed by Thayer Jew [id number]) on 11/14/2016 11:31:26 PM       Radiology No results found.  Procedures Procedures  (including critical care time)  Medications Ordered in ED Medications - No data to display   Initial Impression / Assessment and Plan / ED Course  I have reviewed the triage vital signs and the nursing notes.  Pertinent labs & imaging results that were available during my care of the patient were reviewed by me and considered in my medical decision making (see chart for details).  Clinical Course as of Nov 16 34  Wed Nov 15, 2016  0004 Spoke with Dr. Cheral Marker, neurologist, who agrees to consult on the patient.   [SJ]  [id number] Spoke with Dr. Olevia Bowens, hospitalist, who agreed to admit the patient.  [SJ]    Clinical Course User Index [SJ] Joy, Shawn C, PA-C    Patient will need admission and TIA workup. She had an episode of dizziness with staggering gait last night. Symptoms have resolved and her neuro exam is normal today. However, patient is at increased risk for stroke due to her history of A. fib and SVT without anticoagulation.  Findings and plan of care discussed with Thayer Jew, MD. Dr. Dina Rich personally evaluated and examined this patient.  Vitals:   11/14/16 2200 11/14/16 2230 11/14/16 2300 11/14/16 2330  BP: (!) 153/63 (!) 142/64 (!) 157/62 (!) 147/60  Pulse: 65 69 67 66  Resp: 16 20 18 20   Temp:      TempSrc:      SpO2: 98% 98% 97% 96%  Weight:      Height:         Final Clinical Impressions(s) / ED Diagnoses   Final diagnoses:  Dizziness    New Prescriptions New Prescriptions   No medications on file     Layla Maw 11/15/16 0036    Joy, Helane Gunther, PA-C 11/15/16 0037    Merryl Hacker, MD 11/21/16 [phone]

## 2016-11-15 ENCOUNTER — Observation Stay (HOSPITAL_COMMUNITY): Payer: Medicare Other

## 2016-11-15 DIAGNOSIS — M1990 Unspecified osteoarthritis, unspecified site: Secondary | ICD-10-CM | POA: Diagnosis present

## 2016-11-15 DIAGNOSIS — F0390 Unspecified dementia without behavioral disturbance: Secondary | ICD-10-CM

## 2016-11-15 DIAGNOSIS — I672 Cerebral atherosclerosis: Secondary | ICD-10-CM | POA: Diagnosis present

## 2016-11-15 DIAGNOSIS — G458 Other transient cerebral ischemic attacks and related syndromes: Secondary | ICD-10-CM

## 2016-11-15 DIAGNOSIS — E785 Hyperlipidemia, unspecified: Secondary | ICD-10-CM | POA: Diagnosis present

## 2016-11-15 DIAGNOSIS — Z881 Allergy status to other antibiotic agents status: Secondary | ICD-10-CM | POA: Diagnosis not present

## 2016-11-15 DIAGNOSIS — E876 Hypokalemia: Secondary | ICD-10-CM | POA: Diagnosis not present

## 2016-11-15 DIAGNOSIS — E869 Volume depletion, unspecified: Secondary | ICD-10-CM | POA: Diagnosis present

## 2016-11-15 DIAGNOSIS — K219 Gastro-esophageal reflux disease without esophagitis: Secondary | ICD-10-CM | POA: Diagnosis present

## 2016-11-15 DIAGNOSIS — G459 Transient cerebral ischemic attack, unspecified: Secondary | ICD-10-CM | POA: Diagnosis not present

## 2016-11-15 DIAGNOSIS — Z853 Personal history of malignant neoplasm of breast: Secondary | ICD-10-CM | POA: Diagnosis not present

## 2016-11-15 DIAGNOSIS — R7303 Prediabetes: Secondary | ICD-10-CM | POA: Diagnosis present

## 2016-11-15 DIAGNOSIS — I480 Paroxysmal atrial fibrillation: Secondary | ICD-10-CM | POA: Diagnosis not present

## 2016-11-15 DIAGNOSIS — H9190 Unspecified hearing loss, unspecified ear: Secondary | ICD-10-CM | POA: Diagnosis present

## 2016-11-15 DIAGNOSIS — I361 Nonrheumatic tricuspid (valve) insufficiency: Secondary | ICD-10-CM | POA: Diagnosis not present

## 2016-11-15 DIAGNOSIS — F0391 Unspecified dementia with behavioral disturbance: Secondary | ICD-10-CM | POA: Diagnosis present

## 2016-11-15 DIAGNOSIS — I110 Hypertensive heart disease with heart failure: Secondary | ICD-10-CM | POA: Diagnosis present

## 2016-11-15 DIAGNOSIS — I4891 Unspecified atrial fibrillation: Secondary | ICD-10-CM | POA: Diagnosis present

## 2016-11-15 DIAGNOSIS — F0150 Vascular dementia without behavioral disturbance: Secondary | ICD-10-CM | POA: Diagnosis not present

## 2016-11-15 DIAGNOSIS — I081 Rheumatic disorders of both mitral and tricuspid valves: Secondary | ICD-10-CM | POA: Diagnosis present

## 2016-11-15 DIAGNOSIS — Z7982 Long term (current) use of aspirin: Secondary | ICD-10-CM | POA: Diagnosis not present

## 2016-11-15 DIAGNOSIS — R531 Weakness: Secondary | ICD-10-CM | POA: Diagnosis present

## 2016-11-15 DIAGNOSIS — Z91041 Radiographic dye allergy status: Secondary | ICD-10-CM | POA: Diagnosis not present

## 2016-11-15 DIAGNOSIS — Z87440 Personal history of urinary (tract) infections: Secondary | ICD-10-CM | POA: Diagnosis not present

## 2016-11-15 DIAGNOSIS — I5032 Chronic diastolic (congestive) heart failure: Secondary | ICD-10-CM | POA: Diagnosis present

## 2016-11-15 DIAGNOSIS — Z87891 Personal history of nicotine dependence: Secondary | ICD-10-CM | POA: Diagnosis not present

## 2016-11-15 DIAGNOSIS — Z79899 Other long term (current) drug therapy: Secondary | ICD-10-CM | POA: Diagnosis not present

## 2016-11-15 DIAGNOSIS — Z66 Do not resuscitate: Secondary | ICD-10-CM | POA: Diagnosis present

## 2016-11-15 DIAGNOSIS — Z888 Allergy status to other drugs, medicaments and biological substances status: Secondary | ICD-10-CM | POA: Diagnosis not present

## 2016-11-15 LAB — LIPID PANEL
Cholesterol: 198 mg/dL (ref 0–200)
HDL: 50 mg/dL (ref >40)
LDL CALC: 132 mg/dL — AB (ref 0–99)
Total CHOL/HDL Ratio: 4 RATIO
Triglycerides: 79 mg/dL (ref <150)
VLDL: 16 mg/dL (ref 0–40)

## 2016-11-15 LAB — BASIC METABOLIC PANEL
ANION GAP: 7 (ref 5–15)
BUN: 15 mg/dL (ref 6–20)
CALCIUM: 8.8 mg/dL — AB (ref 8.9–10.3)
CHLORIDE: 108 mmol/L (ref 101–111)
CO2: 27 mmol/L (ref 22–32)
Creatinine, Ser: 0.76 mg/dL (ref 0.44–1.00)
GFR calc non Af Amer: >60 mL/min (ref >60)
Glucose, Bld: 104 mg/dL — ABNORMAL HIGH (ref 65–99)
Potassium: 3.5 mmol/L (ref 3.5–5.1)
Sodium: 142 mmol/L (ref 135–145)

## 2016-11-15 LAB — HEMOGLOBIN A1C
HEMOGLOBIN A1C: 5.5 % (ref 4.8–5.6)
Mean Plasma Glucose: 111.15 mg/dL

## 2016-11-15 LAB — MAGNESIUM: Magnesium: 2.2 mg/dL (ref 1.7–2.4)

## 2016-11-15 LAB — MRSA PCR SCREENING: MRSA by PCR: NEGATIVE

## 2016-11-15 MED ORDER — ACETAMINOPHEN 160 MG/5ML PO SOLN: 650.0000 mg | ORAL | Status: DC | PRN

## 2016-11-15 MED ORDER — ACETAMINOPHEN 650 MG RE SUPP: 650.0000 mg | RECTAL | Status: DC | PRN

## 2016-11-15 MED ORDER — POTASSIUM CHLORIDE CRYS ER 20 MEQ PO TBCR
40.0000 meq | EXTENDED_RELEASE_TABLET | Freq: Once | ORAL | Status: AC
  Administered 2016-11-15: 40 meq via ORAL
  Filled 2016-11-15: qty 2

## 2016-11-15 MED ORDER — STROKE: EARLY STAGES OF RECOVERY BOOK
Freq: Once | Status: AC
  Administered 2016-11-15: 1
  Filled 2016-11-15: qty 1

## 2016-11-15 MED ORDER — ACETAMINOPHEN 325 MG PO TABS: 650.0000 mg | ORAL_TABLET | ORAL | Status: DC | PRN

## 2016-11-15 MED ORDER — ATORVASTATIN CALCIUM 40 MG PO TABS
40.0000 mg | ORAL_TABLET | Freq: Every day | ORAL | Status: DC
  Administered 2016-11-15: 40 mg via ORAL
  Filled 2016-11-15: qty 1

## 2016-11-15 MED ORDER — SODIUM CHLORIDE 0.9 % IV SOLN
INTRAVENOUS | Status: DC
  Administered 2016-11-15: 02:00:00 via INTRAVENOUS

## 2016-11-15 NOTE — H&P (Signed)
History and Physical    Alexis Winters JGG:[id number] DOB: December 26, 1927 DOA: 11/14/2016  PCP: Patient, No Pcp Per   Patient coming from: [facility].  I have personally briefly reviewed patient's old medical records in [place]  Chief Complaint: Weakness.  HPI: Alexis Winters is a 83 y.o. female with medical history significant of allergic rhinitis, arrhythmia, breast cancer, chronic diastolic CHF, dementia, hyperlipidemia, enlarged liver, frequent UTIs, GERD, impaired hearing, hemorrhoids, hypertension, hypoglycemia, mitral regurgitation, osteoarthritis prediabetes who is being brought to the emergency department from [facility] for house with generalized weakness for about a week. Yesterday, the patient had an episode of emesis associated with headache, dizziness and unsteady gait. The patient's daughter stated that she was acting and saying things that she normally would not say. When the patient woke up, the symptoms have resolved. However, the patient was complaining of weakness and was subsequently sent to the emergency department. At this time, she denies headache, dyspnea, chest pain, nausea, emesis, abdominal pain, dysuria, frequency or hematuria.  ED Course: Vital signs in the emergency department temperature 98.46F, pulse 65, respirations 14, blood pressure 151/69 and O2 sat 99%. The emergency department discussed the case with neurology, who recommended to set the patient straight for MRI to the brain. Her CBC and urinalysis were normal. Chemistry showed a potassium level of 3.1, total protein is 6.2, albumin at 3.4 and nonfasting glucose of 119 mg/dL, but all other values were unremarkable.  Review of Systems: As per HPI otherwise 10 point review of systems negative.    Past Medical History:  Diagnosis Date  . Allergic rhinitis   . Arrhythmia    a. office to call pt on 09/15/13 for 30 day event monitor  . Atrial fibrillation ([place])    a. h/o PAF, in NSR during admission  09/11/13  . Breast cancer ([place])   . Chronic diastolic CHF (congestive heart failure) ([place])    a. echo 09/12/13 EF 54-65%, grade 1 diastolic dysf, mild LVOT obstr, moderate MR, elevated filling pressure  . Dementia   . Dyslipidemia   . Enlarged liver   . Frequent UTI   . GERD (gastroesophageal reflux disease)   . Hard of hearing   . Hemorrhoids   . Hypertension   . Hypoglycemia   . Mitral regurgitation    a. moderate MR by echo 09/12/2013  . OA (osteoarthritis)    hip and back  . Orthostatic hypotension   . Phlebitis    l leg  . Prediabetes     Past Surgical History:  Procedure Laterality Date  . ABDOMINAL HYSTERECTOMY    . BLADDER SUSPENSION    . BREAST LUMPECTOMY    . CARDIAC CATHETERIZATION  1980's   [facility]  . FOOT SURGERY     left foot  . INNER EAR SURGERY       reports that she has quit smoking. She has never used smokeless tobacco. She reports that she does not drink alcohol or use drugs.  Allergies  Allergen Reactions  . Contrast Media [Iodinated Diagnostic Agents] Other (See Comments)    Cardiac arrest  . Metoprolol Other (See Comments)    Per the patient's daughter, this causes the patient's heart to race and resulted in syncope (NAME BRAND TOPROL XL ONLY!!)  . Pyridium [Phenazopyridine Hcl] Anaphylaxis, Shortness Of Breath and Swelling  . Levaquin [Levofloxacin In D5w] Other (See Comments)    Per MAR  . Macrodantin [Nitrofurantoin Macrocrystal] Other (See Comments)    Per MAR  .  Other Other (See Comments)    PATIENT CANNOT TOLERATE ANY STICKS OR B/P CUFFS ON HER LEFT ARM BECAUSE OF A FORMER SURGERY (16 lymph nodes were removed due to cancer)  . Tape Other (See Comments)    PATIENT'S SKIN IS VERY THIN AND WILL "SPLIT"; PLEASE USE COBAN WRAP  . Sulfa Antibiotics Other (See Comments)    DEVELOPED INSTANT BLISTERS ON SKIN AND WITHIN THE MOUTH THAT LASTED FOR WEEKS (WITNESSED BY A DOCTOR)    Family History  Problem Relation Age of Onset  . Cerebral palsy Sister    . Alzheimer's disease Brother   . Hypertension Father   . Heart disease Brother     Prior to Admission medications   Medication Sig Start Date End Date Taking? Authorizing Provider  acetaminophen (TYLENOL) 500 MG tablet Take 500 mg by mouth every 4 (four) hours as needed for mild pain, fever or headache.    Yes [provider]  alum & mag hydroxide-simeth ([place]) 200-200-20 MG/5ML suspension Take 30 mLs by mouth every 6 (six) hours as needed for indigestion or heartburn.   Yes [provider]  aspirin EC 81 MG tablet Take 81 mg by mouth daily.   Yes [provider]  Cranberry 450 MG TABS Take 450 mg by mouth 2 (two) times daily.    Yes [provider]  furosemide (LASIX) 40 MG tablet Take 40 mg by mouth 2 (two) times daily.  04/09/12  Yes [provider]  guaifenesin (ROBITUSSIN) 100 MG/5ML syrup Take 200 mg by mouth every 6 (six) hours as needed for cough.   Yes [provider]  ibuprofen (ADVIL,MOTRIN) 200 MG tablet Take 200 mg by mouth every 6 (six) hours as needed (back pain). Reported on 06/22/2015   Yes [provider]  isosorbide mononitrate (IMDUR) 30 MG 24 hr tablet Take 1 tablet (30 mg total) by mouth daily. 10/13/15  Yes Nahser, Wonda Cheng, MD  loperamide (IMODIUM) 2 MG capsule Take 2 mg by mouth daily as needed for diarrhea or loose stools.   Yes [provider]  magnesium hydroxide (MILK OF MAGNESIA) 400 MG/5ML suspension Take 30 mLs by mouth at bedtime as needed for mild constipation.   Yes [provider]  methenamine (MANDELAMINE) 1 g tablet Take 1,000 mg by mouth 2 (two) times daily.   Yes [provider]  mirabegron ER (MYRBETRIQ) 25 MG TB24 tablet Take 25 mg by mouth daily.   Yes [provider]  Neomycin-Bacitracin-Polymyxin (HCA TRIPLE ANTIBIOTIC OINTMENT EX) Apply 1 application topically daily as needed (skin tears/abrasions).    Yes [provider]  pantoprazole  (PROTONIX) 40 MG tablet Take 40 mg by mouth daily. 10/19/14  Yes [provider]  potassium chloride (K-DUR) 10 MEQ tablet Take 2 tablets (20 mEq total) by mouth 3 (three) times daily. 06/21/16  Yes Sherwood Gambler, MD  vitamin C (ASCORBIC ACID) 500 MG tablet Take 500 mg by mouth daily.   Yes [provider]  polyethylene glycol (MIRALAX / GLYCOLAX) packet Take 17 g by mouth daily as needed for moderate constipation. Patient not taking: Reported on 10/26/2016 04/29/16   Leanor Kail, PA    Physical Exam: Vitals:   11/14/16 2330 11/15/16 0015 11/15/16 0100 11/15/16 0211  BP: (!) 147/60 (!) 153/53 (!) 148/78 (!) 152/72  Pulse: 66 67 70 61  Resp: 20 17 18 18   Temp:    98.6 F (37 C)  TempSrc:    Oral  SpO2: 96% 95% 97%  97%  Weight:    58 kg (127 lb 12.8 oz)  Height:        Constitutional: Elderly, frail, but otherwise in NAD, calm, comfortable Eyes: PERRL, lids and conjunctivae normal ENMT: Mucous membranes are moist. Posterior pharynx clear of any exudate or lesions.  Neck: normal, supple, no masses, no thyromegaly Respiratory: Clear to auscultation bilaterally, no wheezing, no crackles. Normal respiratory effort. No accessory muscle use.  Cardiovascular: Regular rate and rhythm, no murmurs / rubs / gallops. No extremity edema. 2+ pedal pulses. No carotid bruits.  Abdomen: Soft, no tenderness, no masses palpated. No hepatosplenomegaly. Bowel sounds positive.  Musculoskeletal: no clubbing / cyanosis.  Good ROM, no contractures. Normal muscle tone.  Skin: Positive lower extremity lymphedema, hyperkeratosis and some patches of erythema. Neurologic: CN 2-12 grossly intact. Sensation intact, DTR normal. Strength 5/5 in all 4.  Psychiatric: Alert and oriented x 2, partially oriented to time and situation.   Labs on Admission: I have personally reviewed following labs and imaging studies  CBC:  Recent Labs Lab 11/14/16 2142  WBC 6.3  NEUTROABS 4.2  HGB 13.7  HCT  40.9  MCV 85.7  PLT 696   Basic Metabolic Panel:  Recent Labs Lab 11/14/16 2142  NA 142  K 3.1*  CL 106  CO2 28  GLUCOSE 119*  BUN 15  CREATININE 0.77  CALCIUM 9.0   GFR: Estimated Creatinine Clearance: 40.6 mL/min (by C-G formula based on SCr of 0.77 mg/dL). Liver Function Tests:  Recent Labs Lab 11/14/16 2142  AST 27  ALT 18  ALKPHOS 84  BILITOT 0.7  PROT 6.3*  ALBUMIN 3.4*    Recent Labs Lab 11/14/16 2142  LIPASE 36   No results for input(s): AMMONIA in the last 168 hours. Coagulation Profile: No results for input(s): INR, PROTIME in the last 168 hours. Cardiac Enzymes: No results for input(s): CKTOTAL, CKMB, CKMBINDEX, TROPONINI in the last 168 hours. BNP (last 3 results) No results for input(s): PROBNP in the last 8760 hours. HbA1C: No results for input(s): HGBA1C in the last 72 hours. CBG: No results for input(s): GLUCAP in the last 168 hours. Lipid Profile: No results for input(s): CHOL, HDL, LDLCALC, TRIG, CHOLHDL, LDLDIRECT in the last 72 hours. Thyroid Function Tests: No results for input(s): TSH, T4TOTAL, FREET4, T3FREE, THYROIDAB in the last 72 hours. Anemia Panel: No results for input(s): VITAMINB12, FOLATE, FERRITIN, TIBC, IRON, RETICCTPCT in the last 72 hours. Urine analysis:    Component Value Date/Time   COLORURINE COLORLESS (A) 11/14/2016 2126   APPEARANCEUR CLEAR 11/14/2016 2126   LABSPEC 1.005 11/14/2016 2126   PHURINE 8.0 11/14/2016 2126   GLUCOSEU NEGATIVE 11/14/2016 2126   HGBUR NEGATIVE 11/14/2016 2126   BILIRUBINUR NEGATIVE 11/14/2016 2126   KETONESUR NEGATIVE 11/14/2016 2126   PROTEINUR NEGATIVE 11/14/2016 2126   UROBILINOGEN 0.2 09/09/2016 2030   NITRITE NEGATIVE 11/14/2016 2126   LEUKOCYTESUR NEGATIVE 11/14/2016 2126    Radiological Exams on Admission: No results found.  EKG: Independently reviewed. Vent. rate 66 BPM PR interval * ms QRS duration 98 ms QT/QTc 416/436 ms P-R-T axes 65 23 28 Sinus  rhythm Abnormal R-wave progression, early transition  Assessment/Plan Physical problem:   TIA (transient ischemic attack) Telemetry/observation. Frequent neuro checks. Continue aspirin. Check MRI/MRA of brain. Check echocardiogram and carotid Doppler. Check fasting lipids and hemoglobin A1c. Neurology will be following.   Active Problems:   Paroxysmal atrial fibrillation (HCC) CHA?DS?-VASc Score of at least 6. Not on anticoagulation or rate control medications. Continue  aspirin. Optimize electrolytes.    Chronic diastolic CHF (congestive heart failure) (HCC) Continue Imdur 30 mg by mouth every 24 hours Furosemide as needed for edema.    Dementia without behavioral disturbance. Supportive care. Anxiolytics as needed.      GERD (gastroesophageal reflux disease) Protonix 40 mg by mouth daily.     DVT prophylaxis: SCDs. Code Status: Full code. Family Communication:  Disposition Plan: *Admit for TIA work up. Consults called: Neurology  Admission status: Observation/telemetry.   Reubin Milan MD [facility] Pager [phone].  If 7PM-7AM, please contact night-coverage [email or web address] Password [email or web address]  11/15/2016, 3:20 AM

## 2016-11-15 NOTE — Evaluation (Signed)
Occupational Therapy Evaluation Patient Details Name: Alexis Winters MRN: [id number] DOB: [birth year] Today's Date: 11/15/2016    History of Present Illness Alexis Winters is a 83 y.o. female with medical history significant of allergic rhinitis, arrhythmia, breast cancer, chronic diastolic CHF, dementia, hyperlipidemia, enlarged liver, frequent UTIs, GERD, impaired hearing, hemorrhoids, hypertension, hypoglycemia, mitral regurgitation, osteoarthritis prediabetes who is being brought to the emergency department from [facility] for house with generalized weakness including dizziness, stumbling gait, and vomiting. MRI of brain Motion degraded exam with no convincing acute infarct, and no large vessel occlusion.   Clinical Impression   PTA Pt was independent in ADL and mobility with RW in a memory care unit at [facility]. Pt is currently min A to min guard for most ADL (mod A for LB dressing) and min A for mobility with RW. Pt requires verbal cues for sequencing and safety, and min A for assist with RW to avoid "obstacles" in the hospital room. Pt loves to help others and is missing her friends from her memory unit. Daughter present and sharing that her mom is different cognitively. Per daughter Pt is supposed to wear a brace on her knee, which is not present for the session. Pt will benefit from skilled OT in the acute setting and afterwards at SNF level to maximize safety and independence in ADL.  Vision not assessed this session, and should be assessed next session.     Follow Up Recommendations  SNF;Supervision/Assistance - 24 hour    Equipment Recommendations  Other (comment) (defer to next venue of care)    Recommendations for Other Services       Precautions / Restrictions Precautions Precautions: Fall Precaution Comments: Pt's right foot is sore, and is supposed to have a knee brace on for ambulation (at baseline) Required Braces or Orthoses: Other Brace/Splint Other Brace/Splint: knee  brace Restrictions Weight Bearing Restrictions: No      Mobility Bed Mobility               General bed mobility comments: Pt sitting OOB in recliner when OT entered room  Transfers Overall transfer level: Needs assistance Equipment used: Rolling walker (2 wheeled) Transfers: Sit to/from Stand Sit to Stand: Min assist         General transfer comment: min A for power up and balance/stability    Balance Overall balance assessment: Needs assistance Sitting-balance support: Feet supported Sitting balance-Leahy Scale: Fair Sitting balance - Comments: able to sit forwards and backwards in recliner   Standing balance support: Bilateral upper extremity supported;During functional activity Standing balance-Leahy Scale: Poor Standing balance comment: reliant on RW and leaning against sink                           ADL either performed or assessed with clinical judgement   ADL Overall ADL's : Needs assistance/impaired Eating/Feeding: Set up;Sitting Eating/Feeding Details (indicate cue type and reason): eating dinner when OT entered the room, using utensils without problem Grooming: Wash/dry hands;Wash/dry face;Min guard;Standing Grooming Details (indicate cue type and reason): sink level, cues for location of soap, paper towels Upper Body Bathing: Supervision/ safety;Sitting   Lower Body Bathing: Minimal assistance   Upper Body Dressing : Minimal assistance   Lower Body Dressing: Moderate assistance;Sit to/from stand Lower Body Dressing Details (indicate cue type and reason): RW in front for stability, OT assisted putting legs through the holes in underwear, but Pt able to pull up underwear once it was over  her knees Toilet Transfer: Min guard;Ambulation;RW;Grab bars;Cueing for safety Toilet Transfer Details (indicate cue type and reason): vc for safety with RW, heavy use of grab bars going up and down from toilet Toileting- Clothing Manipulation and Hygiene:  Minimal assistance;Sit to/from stand Toileting - Clothing Manipulation Details (indicate cue type and reason): to manage hospital gown - Pt able to perform peri care and manage mesh underwear     Functional mobility during ADLs: Min guard;Rolling walker;Cueing for safety General ADL Comments: no knee brace in room at this time     Vision Patient Visual Report: Other (comment) (remains to be assessed )       Perception     Praxis      Pertinent Vitals/Pain Pain Assessment: Faces Faces Pain Scale: Hurts even more Pain Location: R foot Pain Descriptors / Indicators: Discomfort;Sore;Tender Pain Intervention(s): Monitored during session;Repositioned     Hand Dominance Right   Extremity/Trunk Assessment Upper Extremity Assessment Upper Extremity Assessment: Generalized weakness   Lower Extremity Assessment Lower Extremity Assessment: Defer to PT evaluation   Cervical / Trunk Assessment Cervical / Trunk Assessment: Kyphotic   Communication Communication Communication: No difficulties   Cognition Arousal/Alertness: Awake/alert Behavior During Therapy: WFL for tasks assessed/performed Overall Cognitive Status: Impaired/Different from baseline Area of Impairment: Orientation;Memory;Safety/judgement;Awareness;Problem solving                 Orientation Level: Disoriented to;Place;Time;Situation   Memory: Decreased short-term memory   Safety/Judgement: Decreased awareness of deficits;Decreased awareness of safety Awareness: Intellectual Problem Solving: Requires verbal cues General Comments: Pt with deficits at baseline, but according to daughter she is very different from her normal self, she is usually the one going aroud and helping people, and she is never mean like she's been   General Comments  Pt's daughter present for session    Exercises     Shoulder Instructions      Home Living Family/patient expects to be discharged to:: Skilled nursing facility    Available Help at Discharge: [place] Type of Home: [place]                           Additional Comments: Pt was living independently in a memory care unit at [facility]      Prior Functioning/Environment Level of Independence: Independent with assistive device(s)        Comments: used a RW for mobility - but independent in ADL        OT Problem List: Decreased activity tolerance;Impaired balance (sitting and/or standing);Decreased cognition;Decreased safety awareness;Decreased knowledge of use of DME or AE;Pain      OT Treatment/Interventions: Self-care/ADL training;DME and/or AE instruction;Therapeutic activities;Cognitive remediation/compensation;Patient/family education;Balance training    OT Goals(Current goals can be found in the care plan section) Acute Rehab OT Goals Patient Stated Goal: to get back to my friends at my home OT Goal Formulation: With patient Time For Goal Achievement: 11/29/16 Potential to Achieve Goals: Good ADL Goals Pt Will Perform Grooming: with modified independence;standing Pt Will Perform Upper Body Bathing: with modified independence;sitting Pt Will Perform Lower Body Bathing: with adaptive equipment;sit to/from stand;with supervision Pt Will Transfer to Toilet: ambulating;with supervision (with RW; caregiver independent in assisting) Pt Will Perform Toileting - Clothing Manipulation and hygiene: with modified independence;sit to/from stand  OT Frequency: Min 2X/week   Barriers to D/C:    will need 24 hour supervision and increased support for safety during ADL  Co-evaluation              AM-PAC PT "6 Clicks" Daily Activity     Outcome Measure Help from another person eating meals?: None Help from another person taking care of personal grooming?: A Little Help from another person toileting, which includes using toliet, bedpan, or urinal?: A Little Help from another person bathing  (including washing, rinsing, drying)?: A Little Help from another person to put on and taking off regular upper body clothing?: A Little Help from another person to put on and taking off regular lower body clothing?: A Lot 6 Click Score: 18   End of Session Equipment Utilized During Treatment: Gait belt;Rolling walker Nurse Communication: Mobility status  Activity Tolerance: Patient tolerated treatment well Patient left: in chair;with call bell/phone within reach;with chair alarm set;with family/visitor present  OT Visit Diagnosis: Unsteadiness on feet (R26.81);Other abnormalities of gait and mobility (R26.89);Other symptoms and signs involving cognitive function;Pain Pain - Right/Left: Right Pain - part of body: Ankle and joints of foot (foot)                Time: 8675-4492 OT Time Calculation (min): 40 min Charges:  OT General Charges $OT Visit: 1 Procedure OT Evaluation $OT Eval Moderate Complexity: 1 Procedure OT Treatments $Self Care/Home Management : 23-37 mins G-Codes:     Khaniya Tenaglia OTR/L [place] 11/15/2016, 6:17 PM

## 2016-11-15 NOTE — Progress Notes (Signed)
VASCULAR LAB PRELIMINARY  PRELIMINARY  PRELIMINARY  PRELIMINARY  Carotid duplex completed.    Preliminary report:  Bilateral:  1-39% ICA stenosis.  Vertebral artery flow is antegrade.     Samual Beals, RVS 11/15/2016, 10:46 AM

## 2016-11-15 NOTE — Evaluation (Signed)
Speech Language Pathology Evaluation Patient Details Name: Alexis Winters MRN: [id number] DOB: 1927-12-04 Today's Date: 11/15/2016 Time: 0037-0488 SLP Time Calculation (min) (ACUTE ONLY): 11 min  Problem List:  Patient Active Problem List   Diagnosis Date Noted  . TIA (transient ischemic attack) 11/15/2016  . GERD (gastroesophageal reflux disease) 11/15/2016  . Atrial fibrillation ([place])   . SVT (supraventricular tachycardia) ([place]) 08/18/2016  . Dementia without behavioral disturbance 08/18/2016  . UTI (urinary tract infection) 05/29/2016  . Hypokalemia 04/28/2016  . Rash and nonspecific skin eruption 04/28/2016  . Bradycardia 04/27/2016  . Atrial flutter, paroxysmal ([place]) 06/22/2015  . Near syncope 01/10/2015  . Neck pain on left side 01/10/2015  . Pulmonary nodule, right 01/10/2015  . Leukocytosis 01/10/2015  . Hypocalcemia 01/10/2015  . Dyspnea 01/10/2015  . Chest pain 09/11/2013  . Bilateral neck pain 09/11/2013  . Angina at rest [facility] 09/11/2013  . Leg edema 09/05/2013  . Failure to thrive 09/05/2013  . Paroxysmal atrial fibrillation ([place]) 09/05/2013  . Chronic diastolic CHF (congestive heart failure) ([place]) 09/05/2013   Past Medical History:  Past Medical History:  Diagnosis Date  . Allergic rhinitis   . Arrhythmia    a. office to call pt on 09/15/13 for 30 day event monitor  . Atrial fibrillation ([place])    a. h/o PAF, in NSR during admission 09/11/13  . Breast cancer ([place])   . Chronic diastolic CHF (congestive heart failure) ([place])    a. echo 09/12/13 EF 89-16%, grade 1 diastolic dysf, mild LVOT obstr, moderate MR, elevated filling pressure  . Dementia   . Dyslipidemia   . Enlarged liver   . Frequent UTI   . GERD (gastroesophageal reflux disease)   . Hard of hearing   . Hemorrhoids   . Hypertension   . Hypoglycemia   . Mitral regurgitation    a. moderate MR by echo 09/12/2013  . OA (osteoarthritis)    hip and back  . Orthostatic hypotension   . Phlebitis     l leg  . Prediabetes    Past Surgical History:  Past Surgical History:  Procedure Laterality Date  . ABDOMINAL HYSTERECTOMY    . BLADDER SUSPENSION    . BREAST LUMPECTOMY    . CARDIAC CATHETERIZATION  1980's   [facility]  . FOOT SURGERY     left foot  . INNER EAR SURGERY     HPI:  Alexis Winters a 83 y.o.femalewith medical history significant of allergic rhinitis, arrhythmia, breast cancer, chronic diastolic CHF, dementia, hyperlipidemia, enlarged liver, frequent UTIs, GERD, impaired hearing, hemorrhoids, hypertension, hypoglycemia, mitral regurgitation, osteoarthritis prediabetes who is being brought to the emergency department from [place] for house with generalized weakness including dizziness, stumbling gait, and vomiting. MRI of brain Motion degraded exam with no convincing acute infarct, and no large vessel occlusion.   Assessment / Plan / Recommendation Clinical Impression  Pt with a hx of advanced dementia and cognitive deflcits which are suspected to be at baseline. However no family or caregiver present to confirm. PLOF pt resides in SNF. Pt alert and oriented only to person this date and noted to be pleasantly confused. Cognitive deficits appear global. Speech and language skills appear intact. Current neurological imaging without evidence of acute intracranial abnormalities. Recommend resume [facility] hour care at [facility]. No further ST needs identified.      SLP Assessment  SLP Recommendation/Assessment: Patient does not need any further Speech Lanaguage Pathology Services    Follow Up Recommendations  Skilled Nursing facility  Frequency and Duration           SLP Evaluation Cognition  Overall Cognitive Status: No family/caregiver present to determine baseline cognitive functioning Arousal/Alertness: Awake/alert Orientation Level: Oriented to person;Disoriented to situation;Disoriented to time;Disoriented to place Memory: Impaired Awareness: Impaired Problem Solving:  Impaired Executive Function: Organizing;Reasoning Reasoning: Impaired Organizing: Impaired Safety/Judgment: Impaired       Comprehension  Auditory Comprehension Overall Auditory Comprehension: Appears within functional limits for tasks assessed    Expression Expression Primary Mode of Expression: Verbal Verbal Expression Overall Verbal Expression: Appears within functional limits for tasks assessed   Oral / Motor  Oral Motor/Sensory Function Overall Oral Motor/Sensory Function: Within functional limits Motor Speech Overall Motor Speech: Appears within functional limits for tasks assessed   GO                   Alexis Chaco MA, [place] 11/15/2016, 4:01 PM

## 2016-11-15 NOTE — Progress Notes (Signed)
OT Cancellation Note  Patient Details Name: Alexis Winters MRN: [id number] DOB: 1927/10/21   Cancelled Treatment:    Reason Eval/Treat Not Completed: Patient at procedure or test/ unavailable. OT will continue to follow for evaluation as schedule allows.  [place] 11/15/2016, 9:21 AM  Hulda Humphrey OTR/L [phone]

## 2016-11-15 NOTE — Progress Notes (Signed)
STROKE TEAM PROGRESS NOTE   HISTORY OF PRESENT ILLNESS (per record)  Alexis Winters is an 83 y.o. female with a history of paroxsysmal atrial fibrillation, history of breast cancer, CHF, dementia, frequent UTI, hypertension, mitral regurgitation, and prediabetes who presented to the ED on Tuesday evening with dizziness, stumbling gait and vomiting. The patient is a poor historian due to dementia and no family is available to provide history. HPI in the chart was reviewed:   "Alexis Winters a 83 y.o.femalewith medical history significant of allergic rhinitis, arrhythmia, breast cancer, chronic diastolic CHF, dementia, hyperlipidemia, enlarged liver, frequent UTIs, GERD, impaired hearing, hemorrhoids, hypertension, hypoglycemia, mitral regurgitation, osteoarthritis prediabetes who is being brought to the emergency department from [place] for house with generalized weakness for about a week. Yesterday, the patient had an episode of emesis associated with headache, dizziness and unsteady gait. The patient's daughter stated that she was acting and saying things that she normally would not say. When the patient woke up, the symptoms have resolved. However, the patient was complaining of weakness and was subsequently sent to the emergency department. At this time, she denies headache, dyspnea, chest pain, nausea, emesis, abdominal pain, dysuria, frequency or hematuria."  Patient was not administered IV t-PA secondary to late presentation. She was admitted to General Neurology for further evaluation and treatment.   SUBJECTIVE (INTERVAL HISTORY) No family is at the bedside.  The patient is awake and pleasant, but very confused.  Significant bilateral lower extremity edema noted.   OBJECTIVE Temp:  [97.3 F (36.3 C)-98.7 F (37.1 C)] 97.3 F (36.3 C) (08/22 1345) Pulse Rate:  [60-93] 74 (08/22 1345) Cardiac Rhythm: Normal sinus rhythm (08/22 0849) Resp:  [14-21] 18 (08/22 1345) BP:  (120-176)/(49-78) 143/49 (08/22 1345) SpO2:  [94 %-99 %] 96 % (08/22 1345) Weight:  [58 kg (127 lb 12.8 oz)-59 kg (130 lb)] 58 kg (127 lb 12.8 oz) (08/22 0211)  CBC:   Recent Labs Lab 11/14/16 2142  WBC 6.3  NEUTROABS 4.2  HGB 13.7  HCT 40.9  MCV 85.7  PLT 384    Basic Metabolic Panel:   Recent Labs Lab 11/14/16 2142 11/15/16 0539  NA 142 142  K 3.1* 3.5  CL 106 108  CO2 28 27  GLUCOSE 119* 104*  BUN 15 15  CREATININE 0.77 0.76  CALCIUM 9.0 8.8*  MG  --  2.2    Lipid Panel:     Component Value Date/Time   CHOL 198 11/15/2016 0539   TRIG 79 11/15/2016 0539   HDL 50 11/15/2016 0539   CHOLHDL 4.0 11/15/2016 0539   VLDL 16 11/15/2016 0539   LDLCALC 132 (H) 11/15/2016 0539   HgbA1c:  Lab Results  Component Value Date   HGBA1C 5.5 11/15/2016   Urine Drug Screen: No results found for: LABOPIA, COCAINSCRNUR, LABBENZ, AMPHETMU, THCU, LABBARB  Alcohol Level No results found for: [facility]  IMAGING  Dg Chest 2 View 11/15/2016 IMPRESSION: No acute cardiopulmonary abnormality.  Mr Brain 25 Contrast Mr Virgel Paling Wo Contrast 11/15/2016 IMPRESSION: 1. Motion degraded exam with no convincing acute infarct, and no large vessel occlusion. 2. Widespread intracranial atherosclerosis with moderate to severe distal left ICA and left PCA stenoses. 3. Generalized cerebral volume loss, and generalized cerebral white matter signal changes most commonly due to chronic small vessel disease.   TTE 11/15/2016  pending  Carotid US 11/15/2016 Bilateral:  1-39% ICA stenosis.  Vertebral artery flow is antegrade  TCD 11/15/2016  pending    PHYSICAL EXAM  Obese elderly lad not in distress. . Afebrile. Head is nontraumatic. Neck is supple without bruit.    Cardiac exam no murmur or gallop. Lungs are clear to auscultation. Distal pulses are not well felt.Significant bilateral lower extremity edema up to the knees Neurological Exam :  Awake alert oriented to person only. Diminished  attention, registration and recall. Poor insight and fund of knowledge. Limited speech output but fluent. Follows simple midline and occasional one-step commands only. No dysarthria or aphasia. Pupils irregular reactive. Fundi not visualized. Vision acuity seems adequate. Extraocular moments are full range without nystagmus. Blinks to threat bilaterally. No facial weakness. Tongue midline. Bilateral palmomental reflexes present. Motor system exam able to move all 4 extremities against gravity but effort is poor. No focal weakness. Touch pinprick sensation preserved bilaterally. The tendon reflexes are 1+. Plantars downgoing. Gait not tested. ASSESSMENT/PLAN Alexis Winters is a 83 y.o. female with history of atrial fibrillation, breast cancer, CHF, dementia, frequent UTI, hypertension, mitral regurgitation, and prediabetes who presented to the ED on Tuesday evening with dizziness, stumbling gait and vomiting. She did not receive IV t-PA due to late presentation.   No Stroke  Resultant  loss stroke related deficits. Baseline dementia.  CT head: not ordered  MRI head: no acute infarct  MRA head: Diffuse atherosclerosis and moderate to severe distal left ICA and left PCA stenosis  Carotid Doppler: B ICA 1-39% stenosis, VAs antegrade  2D Echo  pending  TCD:  pending   LDL 132  HgbA1c 5.5  SCDs for VTE prophylaxis Diet Heart Room service appropriate? Yes; Fluid consistency: Thin  aspirin 81 mg daily prior to admission, now on No antithrombotic  Patient counseled to be compliant with her antithrombotic medications  Ongoing aggressive stroke risk factor management  Therapy recommendations:  pending  Disposition:   pending  Hypertension  Stable  Permissive hypertension (OK if < 220/120) but gradually normalize in 5-7 days  Long-term BP goal normotensive  Hyperlipidemia  Home meds:  none  LDL 132, goal < 70  Add atorvastatin 40 mg PO daily  Continue statin at  discharge  Diabetes  HgbA1c 5.5, goal < 7.0  Controlled  Other Stroke Risk Factors  Advanced age  History of atrial fibrillation without anticoagulation  CHF  History of breast cancer  Other Active Problems  None  Hospital day # 0  I have personally examined this patient, reviewed notes, independently viewed imaging studies, participated in medical decision making and plan of care.ROS completed by me personally and pertinent positives fully documented  I have made any additions or clarifications directly to the above note.. She presented with dizziness and stumbling without focal deficits and MRI scan does not show any convincing acute infarct. She does have widespread atherosclerotic changes and generalized volume loss which is compatible with her dementia. Recommend start aspirin for stroke prevention and continue ongoing evaluation. Physical occupational therapy consults. Patient has atrial fibrillation but is not a good long-term anticoagulation candidate given her dementia and fall risk. Discussed with Dr. Eliseo Squires. Greater than 50% time during this 35 minute visit was spent on counseling and coordination of care about atrial fibrillation and stroke and TIA risk and discussion about bleeding and fall risk.  Antony Contras, MD Medical Director [place] Pager: [phone] 11/15/2016 5:48 PM   To contact Stroke Continuity provider, please refer to [email or web address] After hours, contact General Neurology

## 2016-11-15 NOTE — Evaluation (Signed)
Physical Therapy Evaluation Patient Details Name: Alexis Winters MRN: [id number] DOB: [birth year] Today's Date: 11/15/2016   History of Present Illness  Alexis Winters is a 83 y.o. female with medical history significant of arrhythmia, breast cancer, chronic diastolic CHF, dementia, frequent UTIs, HOH, hypertension, hypoglycemia, mitral regurgitation, osteoarthritis, pre-diabetes who was brought to the emergency department from [facility] with generalized weakness including dizziness, stumbling gait, and vomiting. MRI of brain was motion degraded with no convincing acute infarct, and no large vessel occlusion.  Clinical Impression  Pt is able to move about her room with RW and min to min guard assist.  Daughter reports her cognitive deficits are more than her baseline.  She is likely mobile enough to return to her Memory Care Unit with PT f/u at discharge.   PT to follow acutely for deficits listed below.       Follow Up Recommendations Home health PT;Other (comment) (return to her memory care unit with PT)    Equipment Recommendations  None recommended by PT    Recommendations for Other Services   NA    Precautions / Restrictions Precautions Precautions: Fall Precaution Comments: Pt's right foot is sore, and is supposed to have a knee brace on for ambulation (at baseline) Required Braces or Orthoses: Other Brace/Splint Other Brace/Splint: knee brace Restrictions Weight Bearing Restrictions: No      Mobility  Bed Mobility Overal bed mobility: Needs Assistance Bed Mobility: Supine to Sit     Supine to sit: Min assist     General bed mobility comments: Min assist to support trunk to get to sitting EOB.  Assit to help progress both legs and then trunk to upright sitting.   Transfers Overall transfer level: Needs assistance Equipment used: Rolling walker (2 wheeled) Transfers: Sit to/from Stand Sit to Stand: Min assist         General transfer comment:  Min assist to support trunk during transitions. Verbal cues for safe hand placement.   Ambulation/Gait Ambulation/Gait assistance: Min guard Ambulation Distance (Feet): 20 Feet Assistive device: Rolling walker (2 wheeled) Gait Pattern/deviations: Step-through pattern;Shuffle     General Gait Details: Pt needed min assist at trunk for balance and assist steering RW around obstacles.  Max cues for safe use of RW as pt would frequently let go of it and try to continue on without it.                       Balance Overall balance assessment: Needs assistance Sitting-balance support: Feet supported Sitting balance-Leahy Scale: Fair Sitting balance - Comments: Supervision EOB, but not challenged.   Standing balance support: Bilateral upper extremity supported;During functional activity Standing balance-Leahy Scale: Poor Standing balance comment: Reliant on RW and light external assit for balance.                              Pertinent Vitals/Pain Pain Assessment: Faces Faces Pain Scale: Hurts even more Pain Location: R foot Pain Descriptors / Indicators: Discomfort;Sore;Tender Pain Intervention(s): Limited activity within patient's tolerance;Monitored during session;Repositioned    Home Living Family/patient expects to be discharged to:: Assisted living ([facility])   Available Help at Discharge: Available PRN/intermittently (ALF staff) Type of Home: Assisted living         Home Equipment: Walker - 2 wheels Additional Comments: Pt was living independently in a memory care [facility] at [facility]  Prior Function Level of Independence: Independent with assistive device(s)         Comments: used a RW for mobility - but independent in bething, dressing, light straightening of her home.     Hand Dominance   Dominant Hand: Right    Extremity/Trunk Assessment   Upper Extremity Assessment Upper Extremity Assessment: Defer to OT  evaluation    Lower Extremity Assessment Lower Extremity Assessment: LLE deficits/detail LLE Deficits / Details: left leg with significant knee OA and significant valgus deformity.  Per daughter pt is supposed to wear a knee brace on her left leg at all times when walking/up.  This did not seem to bother pt to not have it on for short distance gait to the bathroom and around the room.  no buckling during gait.     Cervical / Trunk Assessment Cervical / Trunk Assessment: Kyphotic  Communication   Communication: No difficulties  Cognition Arousal/Alertness: Awake/alert Behavior During Therapy: WFL for tasks assessed/performed Overall Cognitive Status: Impaired/Different from baseline Area of Impairment: Orientation;Memory;Safety/judgement;Awareness;Problem solving;Attention                 Orientation Level: Disoriented to;Place;Time;Situation Current Attention Level: Selective Memory: Decreased short-term memory   Safety/Judgement: Decreased awareness of deficits;Decreased awareness of safety Awareness: Intellectual Problem Solving: Requires verbal cues;Difficulty sequencing General Comments: Pt required cueing to find her daughter in the room, cues to find the sink once out of the bathroom, generally confused, daughter reporting this is more than her baseline deficits.              Assessment/Plan    PT Assessment Patient needs continued PT services  PT Problem List Decreased strength;Decreased activity tolerance;Decreased balance;Decreased mobility;Decreased cognition;Decreased knowledge of use of DME;Decreased safety awareness;Pain       PT Treatment Interventions DME instruction;Gait training;Functional mobility training;Therapeutic activities;Therapeutic exercise;Balance training;Neuromuscular re-education;Cognitive remediation    PT Goals (Current goals can be found in the Care Plan section)  Acute Rehab PT Goals Patient Stated Goal: to get back home PT Goal  Formulation: With patient/family Time For Goal Achievement: 11/29/16 Potential to Achieve Goals: Good    Frequency Min 3X/week           AM-PAC PT "6 Clicks" Daily Activity  Outcome Measure Difficulty turning over in bed (including adjusting bedclothes, sheets and blankets)?: Unable Difficulty moving from lying on back to sitting on the side of the bed? : Unable Difficulty sitting down on and standing up from a chair with arms (e.g., wheelchair, bedside commode, etc,.)?: Unable Help needed moving to and from a bed to chair (including a wheelchair)?: A Little Help needed walking in hospital room?: A Little Help needed climbing 3-5 steps with a railing? : A Lot 6 Click Score: 11    End of Session Equipment Utilized During Treatment: Gait belt Activity Tolerance: Patient limited by fatigue Patient left: in chair;with call bell/phone within reach;with chair alarm set;with family/visitor present   PT Visit Diagnosis: Muscle weakness (generalized) (M62.81);Difficulty in walking, not elsewhere classified (R26.2)    Time: 0814-4818 PT Time Calculation (min) (ACUTE ONLY): 22 min   Charges:   PT Evaluation $PT Eval Moderate Complexity: 1 Mod          Madalene Mickler B. Carlos, [place], DPT [phone]   11/15/2016, 10:40 PM

## 2016-11-15 NOTE — Progress Notes (Signed)
SLP Cancellation Note  Patient Details Name: Alexis Winters MRN: [id number] DOB: 11/15/1927   Cancelled treatment:       Reason Eval/Treat Not Completed: Patient at procedure or test/unavailable; SLP to check back for completion of cognitive linguistic evaluation   Arvil Chaco MA, [place] 11/15/2016, 10:26 AM

## 2016-11-15 NOTE — Consult Note (Signed)
Referring Physician: Dr. Olevia Bowens    Chief Complaint: Dizziness, stumbling gait and vomiting  HPI: Alexis Winters is an 83 y.o. female who presented to the ED on Tuesday evening with dizziness, stumbling gait and vomiting. The patient is a poor historian due to dementia and no family is available to provide history. HPI in the chart was reviewed:   "Alexis Winters is a 83 y.o. female with medical history significant of allergic rhinitis, arrhythmia, breast cancer, chronic diastolic CHF, dementia, hyperlipidemia, enlarged liver, frequent UTIs, GERD, impaired hearing, hemorrhoids, hypertension, hypoglycemia, mitral regurgitation, osteoarthritis prediabetes who is being brought to the emergency department from [facility] for house with generalized weakness for about a week. Yesterday, the patient had an episode of emesis associated with headache, dizziness and unsteady gait. The patient's daughter stated that she was acting and saying things that she normally would not say. When the patient woke up, the symptoms have resolved. However, the patient was complaining of weakness and was subsequently sent to the emergency department. At this time, she denies headache, dyspnea, chest pain, nausea, emesis, abdominal pain, dysuria, frequency or hematuria."   Past Medical History:  Diagnosis Date  . Allergic rhinitis   . Arrhythmia    a. office to call pt on 09/15/13 for 30 day event monitor  . Atrial fibrillation ([place])    a. h/o PAF, in NSR during admission 09/11/13  . Breast cancer ([place])   . Chronic diastolic CHF (congestive heart failure) ([place])    a. echo 09/12/13 EF 38-10%, grade 1 diastolic dysf, mild LVOT obstr, moderate MR, elevated filling pressure  . Dementia   . Dyslipidemia   . Enlarged liver   . Frequent UTI   . GERD (gastroesophageal reflux disease)   . Hard of hearing   . Hemorrhoids   . Hypertension   . Hypoglycemia   . Mitral regurgitation    a. moderate MR by echo 09/12/2013  . OA  (osteoarthritis)    hip and back  . Orthostatic hypotension   . Phlebitis    l leg  . Prediabetes     Past Surgical History:  Procedure Laterality Date  . ABDOMINAL HYSTERECTOMY    . BLADDER SUSPENSION    . BREAST LUMPECTOMY    . CARDIAC CATHETERIZATION  1980's   [facility]  . FOOT SURGERY     left foot  . INNER EAR SURGERY      Family History  Problem Relation Age of Onset  . Cerebral palsy Sister   . Alzheimer's disease Brother   . Hypertension Father   . Heart disease Brother    Social History:  reports that she has quit smoking. She has never used smokeless tobacco. She reports that she does not drink alcohol or use drugs.  Allergies:  Allergies  Allergen Reactions  . Contrast Media [Iodinated Diagnostic Agents] Other (See Comments)    Cardiac arrest  . Metoprolol Other (See Comments)    Per the patient's daughter, this causes the patient's heart to race and resulted in syncope (NAME BRAND TOPROL XL ONLY!!)  . Pyridium [Phenazopyridine Hcl] Anaphylaxis, Shortness Of Breath and Swelling  . Levaquin [Levofloxacin In D5w] Other (See Comments)    Per MAR  . Macrodantin [Nitrofurantoin Macrocrystal] Other (See Comments)    Per MAR  . Other Other (See Comments)    PATIENT CANNOT TOLERATE ANY STICKS OR B/P CUFFS ON HER LEFT ARM BECAUSE OF A FORMER SURGERY (16 lymph nodes were removed due to cancer)  . Tape  Other (See Comments)    PATIENT'S SKIN IS VERY THIN AND WILL "SPLIT"; PLEASE USE COBAN WRAP  . Sulfa Antibiotics Other (See Comments)    DEVELOPED INSTANT BLISTERS ON SKIN AND WITHIN THE MOUTH THAT LASTED FOR WEEKS (WITNESSED BY A DOCTOR)    Medications:  Prior to Admission:  Prescriptions Prior to Admission  Medication Sig Dispense Refill Last Dose  . acetaminophen (TYLENOL) 500 MG tablet Take 500 mg by mouth every 4 (four) hours as needed for mild pain, fever or headache.    unknown at prn  . alum & mag hydroxide-simeth (MINTOX) 200-200-20 MG/5ML suspension Take 30 mLs  by mouth every 6 (six) hours as needed for indigestion or heartburn.   10/31/2016 at prn  . aspirin EC 81 MG tablet Take 81 mg by mouth daily.   11/14/2016 at Unknown time  . Cranberry 450 MG TABS Take 450 mg by mouth 2 (two) times daily.    11/14/2016 at Unknown time  . furosemide (LASIX) 40 MG tablet Take 40 mg by mouth 2 (two) times daily.    11/14/2016 at Unknown time  . guaifenesin (ROBITUSSIN) 100 MG/5ML syrup Take 200 mg by mouth every 6 (six) hours as needed for cough.   unknown at prn  . ibuprofen (ADVIL,MOTRIN) 200 MG tablet Take 200 mg by mouth every 6 (six) hours as needed (back pain). Reported on 06/22/2015   11/13/2016 at prn  . isosorbide mononitrate (IMDUR) 30 MG 24 hr tablet Take 1 tablet (30 mg total) by mouth daily. 30 tablet 11 11/14/2016 at Unknown time  . loperamide (IMODIUM) 2 MG capsule Take 2 mg by mouth daily as needed for diarrhea or loose stools.   unknown at prn  . magnesium hydroxide (MILK OF MAGNESIA) 400 MG/5ML suspension Take 30 mLs by mouth at bedtime as needed for mild constipation.   11/10/2016 at prn  . methenamine (MANDELAMINE) 1 g tablet Take 1,000 mg by mouth 2 (two) times daily.   11/14/2016 at Unknown time  . mirabegron ER (MYRBETRIQ) 25 MG TB24 tablet Take 25 mg by mouth daily.   11/14/2016 at Unknown time  . Neomycin-Bacitracin-Polymyxin (HCA TRIPLE ANTIBIOTIC OINTMENT EX) Apply 1 application topically daily as needed (skin tears/abrasions).    unknown at prn  . pantoprazole (PROTONIX) 40 MG tablet Take 40 mg by mouth daily.  3 11/14/2016 at Unknown time  . potassium chloride (K-DUR) 10 MEQ tablet Take 2 tablets (20 mEq total) by mouth 3 (three) times daily. 20 tablet 0 11/14/2016 at Unknown time  . vitamin C (ASCORBIC ACID) 500 MG tablet Take 500 mg by mouth daily.   11/14/2016 at Unknown time  . polyethylene glycol (MIRALAX / GLYCOLAX) packet Take 17 g by mouth daily as needed for moderate constipation. (Patient not taking: Reported on 10/26/2016) 14 each 0 Not Taking at  Unknown time   Scheduled: . potassium chloride  40 mEq Oral Once    ROS: Limited due to cognitive deficits. Denies any pain.   Physical Examination: Blood pressure (!) 120/51, pulse 80, temperature 98.7 F (37.1 C), temperature source Oral, resp. rate 18, height 5\' 1"  (1.549 m), weight 58 kg (127 lb 12.8 oz), SpO2 94 %.  HEENT: [place]/AT. Oral mucosa with decreased hydration.  Lungs: Respirations unlabored.  Ext: Warm and well perfused. Lymphedema lower extremities bilaterally. No pitting edema.   Neurologic Examination: Mental Status: Awake. Oriented to self, city and state, but not day or year. Decreased attention. Stimulus bound behavior noted. Poor insight and fund  of knowledge. Poverty of thought. Limited speech output is fluent. Able to follow simple commands but often requires repeated prompting. Repetition impaired. Can name thumb, pinky and badge. No pathological palmomental or rooting reflex noted.  Cranial Nerves: II:  Blinks to threat in left and right visual fields. PERRL.  III,IV, VI: ptosis not present, EOMI without nystagmus.  V,VII: smile symmetric, blinks to light touch applied in V2 distribution bilaterally VIII: HOH IX,X: no hypophonia XI: symmetric XII: midline tongue extension Motor: Right : Upper extremity   5/5    Left:     Upper extremity   5/5  Lower extremity   4+/5    Lower extremity   4+/5 Paratonia noted bilateral upper extremities.  Sensory: Light touch intact all 4 extremities.  Deep Tendon Reflexes:  1+ bilateral upper extremities and patellae. Trace achilles reflexes bilaterally.  Plantars: Equivocal bilaterally.  Cerebellar: No ataxia with FNF bilaterally.  Gait: Deferred due to falls risk concerns  Results for orders placed or performed during the hospital encounter of 11/14/16 (from the past 48 hour(s))  Urinalysis, Routine w reflex microscopic     Status: Abnormal   Collection Time: 11/14/16  9:26 PM  Result Value Ref Range   Color, Urine  COLORLESS (A) YELLOW   APPearance CLEAR CLEAR   Specific Gravity, Urine 1.005 1.005 - 1.030   pH 8.0 5.0 - 8.0   Glucose, UA NEGATIVE NEGATIVE mg/dL   Hgb urine dipstick NEGATIVE NEGATIVE   Bilirubin Urine NEGATIVE NEGATIVE   Ketones, ur NEGATIVE NEGATIVE mg/dL   Protein, ur NEGATIVE NEGATIVE mg/dL   Nitrite NEGATIVE NEGATIVE   Leukocytes, UA NEGATIVE NEGATIVE  CBC with Differential     Status: None   Collection Time: 11/14/16  9:42 PM  Result Value Ref Range   WBC 6.3 4.0 - 10.5 K/uL   RBC 4.77 3.87 - 5.11 MIL/uL   Hemoglobin 13.7 12.0 - 15.0 g/dL   HCT 40.9 36.0 - 46.0 %   MCV 85.7 78.0 - 100.0 fL   MCH 28.7 26.0 - 34.0 pg   MCHC 33.5 30.0 - 36.0 g/dL   RDW 12.9 11.5 - 15.5 %   Platelets 158 150 - 400 K/uL   Neutrophils Relative % 66 %   Neutro Abs 4.2 1.7 - 7.7 K/uL   Lymphocytes Relative 23 %   Lymphs Abs 1.5 0.7 - 4.0 K/uL   Monocytes Relative 8 %   Monocytes Absolute 0.5 0.1 - 1.0 K/uL   Eosinophils Relative 3 %   Eosinophils Absolute 0.2 0.0 - 0.7 K/uL   Basophils Relative 0 %   Basophils Absolute 0.0 0.0 - 0.1 K/uL  Comprehensive metabolic panel     Status: Abnormal   Collection Time: 11/14/16  9:42 PM  Result Value Ref Range   Sodium 142 135 - 145 mmol/L   Potassium 3.1 (L) 3.5 - 5.1 mmol/L   Chloride 106 101 - 111 mmol/L   CO2 28 22 - 32 mmol/L   Glucose, Bld 119 (H) 65 - 99 mg/dL   BUN 15 6 - 20 mg/dL   Creatinine, Ser 0.77 0.44 - 1.00 mg/dL   Calcium 9.0 8.9 - 10.3 mg/dL   Total Protein 6.3 (L) 6.5 - 8.1 g/dL   Albumin 3.4 (L) 3.5 - 5.0 g/dL   AST 27 15 - 41 U/L   ALT 18 14 - 54 U/L   Alkaline Phosphatase 84 38 - 126 U/L   Total Bilirubin 0.7 0.3 - 1.2 mg/dL   GFR  calc non Af Amer >60 >60 mL/min   GFR calc Af Amer >60 >60 mL/min    Comment: (NOTE) The eGFR has been calculated using the CKD EPI equation. This calculation has not been validated in all clinical situations. eGFR's persistently <60 mL/min signify possible Chronic Kidney Disease.     Anion gap 8 5 - 15  Lipase, blood     Status: None   Collection Time: 11/14/16  9:42 PM  Result Value Ref Range   Lipase 36 11 - 51 U/L  MRSA PCR Screening     Status: None   Collection Time: 11/15/16  2:21 AM  Result Value Ref Range   MRSA by PCR NEGATIVE NEGATIVE    Comment:        The GeneXpert MRSA Assay (FDA approved for NASAL specimens only), is one component of a comprehensive MRSA colonization surveillance program. It is not intended to diagnose MRSA infection nor to guide or monitor treatment for MRSA infections.   Hemoglobin A1c     Status: None   Collection Time: 11/15/16  5:39 AM  Result Value Ref Range   Hgb A1c MFr Bld 5.5 4.8 - 5.6 %    Comment: (NOTE) Pre diabetes:          5.7%-6.4% Diabetes:              >6.4% Glycemic control for   <7.0% adults with diabetes    Mean Plasma Glucose 111.15 mg/dL  Magnesium     Status: None   Collection Time: 11/15/16  5:39 AM  Result Value Ref Range   Magnesium 2.2 1.7 - 2.4 mg/dL   No results found.  Assessment: 83 y.o. female presenting with dizziness, stumbling gait and vomiting 1. Exam non-lateralizing. Findings most consistent with dementia are noted.  2. Overall clinical picture most consistent with volume depletion resulting in dizziness and worsened mentation in the context of decreased Neurological reserve secondary to dementia.  3. Lower on DDx but also possible would be a posterior fossa TIA manifesting as vertigo with ataxia and vomiting. Increasing the risk of such is her paroxysmal atrial fibrillation. Her CHA2DS2-VASc score is at least 6. She is on ASA. Not on anticoagulation as an outpatient, most likely due to increased falls risk in the setting of dementia. Will need stroke work up.  4. Diastolic CHF. Lungs without crackles and no pitting edema. Most likely can be rehydrated IV at about 50-75 cc/hr but will defer to primary team for this. Her current rate of fluid intake is 10 cc/hr IV.     Plan: 1.  HgbA1c, fasting lipid panel 2. MRI, MRA of the brain without contrast 3. PT consult, OT consult, Speech consult 4. Echocardiogram 5. Carotid dopplers 6. Continue ASA 7. Gentle IV rehydration 8. Telemetry monitoring 9. Frequent neuro checks  '@Electronically'$  signed: Dr. Kerney Elbe  11/15/2016, 6:33 AM

## 2016-11-15 NOTE — Progress Notes (Signed)
Patient admitted after midnight, please see H&P.  H/o a fib/SVT-- per Dr. Acie Fredrickson in 2015 not a candidate for aggressive intervention or anticoagulation-- placed on ASA instead.  No sign of infection/UTI.  Lives at [facility]. Here for TIA/CVA work up.  MRI pending Eulogio Bear DO

## 2016-11-15 NOTE — Progress Notes (Signed)
VASCULAR LAB PRELIMINARY  PRELIMINARY  PRELIMINARY  PRELIMINARY  Transcranial Doppler completed.    Preliminary report:  TCD completed  Axyl Sitzman, RVS 11/15/2016, 10:48 AM

## 2016-11-16 ENCOUNTER — Inpatient Hospital Stay (HOSPITAL_COMMUNITY): Payer: Medicare Other

## 2016-11-16 DIAGNOSIS — E876 Hypokalemia: Secondary | ICD-10-CM

## 2016-11-16 DIAGNOSIS — I361 Nonrheumatic tricuspid (valve) insufficiency: Secondary | ICD-10-CM

## 2016-11-16 DIAGNOSIS — F0150 Vascular dementia without behavioral disturbance: Secondary | ICD-10-CM

## 2016-11-16 DIAGNOSIS — I480 Paroxysmal atrial fibrillation: Secondary | ICD-10-CM

## 2016-11-16 LAB — VAS US CAROTID
LCCADDIAS: -13 cm/s
LCCADSYS: -81 cm/s
LCCAPDIAS: 12 cm/s
LEFT ECA DIAS: -18 cm/s
LEFT VERTEBRAL DIAS: -17 cm/s
LICADDIAS: -11 cm/s
LICAPDIAS: -13 cm/s
LICAPSYS: -95 cm/s
Left CCA prox sys: 88 cm/s
Left ICA dist sys: -67 cm/s
RCCAPSYS: 76 cm/s
RIGHT ECA DIAS: -1 cm/s
RIGHT VERTEBRAL DIAS: -10 cm/s
Right CCA prox dias: 7 cm/s
Right cca dist sys: -83 cm/s

## 2016-11-16 LAB — ECHOCARDIOGRAM COMPLETE
HEIGHTINCHES: 61 in
WEIGHTICAEL: 2044.8 [oz_av]

## 2016-11-16 MED ORDER — ASPIRIN 325 MG PO TABS: 325.0000 mg | ORAL_TABLET | Freq: Every day | ORAL | 0 refills | Status: DC

## 2016-11-16 MED ORDER — ASPIRIN 325 MG PO TABS
325.0000 mg | ORAL_TABLET | Freq: Every day | ORAL | Status: DC
  Administered 2016-11-16: 325 mg via ORAL
  Filled 2016-11-16: qty 1

## 2016-11-16 MED ORDER — ATORVASTATIN CALCIUM 40 MG PO TABS: 40.0000 mg | ORAL_TABLET | Freq: Every day | ORAL | 0 refills | Status: DC

## 2016-11-16 NOTE — Progress Notes (Signed)
  Echocardiogram 2D Echocardiogram has been performed.  Bobbye Charleston 11/16/2016, 10:49 AM

## 2016-11-16 NOTE — Progress Notes (Signed)
Discharge to: [facility] Anticipated discharge date: 11/16/16 Family notified: Yes, by phone Transportation by: Family Car  Report #: [phone]  [place] signing off.  Laveda Abbe LCSW [phone]

## 2016-11-16 NOTE — NC FL2 (Signed)
[place] LEVEL OF CARE SCREENING TOOL     IDENTIFICATION  Patient Name: Alexis Winters Birthdate: Nov 23, 1927 Sex: female Admission Date (Current Location): 11/14/2016  [place] [facility] and [place] Number:  Herbalist and Address:  [facility] [place], [place] [address], [place], [place] [address]      Provider Number: [id number]  Attending Physician Name and Address:  Louellen Molder, MD  Relative Name and Phone Number:       Current Level of Care: Hospital Recommended Level of Care: [place] Prior Approval Number:    Date Approved/Denied:   PASRR Number:    Discharge Plan: Other (Comment) (ALF)    Current Diagnoses: Patient Active Problem List   Diagnosis Date Noted  . TIA (transient ischemic attack) 11/15/2016  . GERD (gastroesophageal reflux disease) 11/15/2016  . Atrial fibrillation ([place])   . SVT (supraventricular tachycardia) ([place]) 08/18/2016  . Dementia without behavioral disturbance 08/18/2016  . UTI (urinary tract infection) 05/29/2016  . Hypokalemia 04/28/2016  . Rash and nonspecific skin eruption 04/28/2016  . Bradycardia 04/27/2016  . Atrial flutter, paroxysmal ([place]) 06/22/2015  . Near syncope 01/10/2015  . Neck pain on left side 01/10/2015  . Pulmonary nodule, right 01/10/2015  . Leukocytosis 01/10/2015  . Hypocalcemia 01/10/2015  . Dyspnea 01/10/2015  . Chest pain 09/11/2013  . Bilateral neck pain 09/11/2013  . Angina at rest [facility]) 09/11/2013  . Leg edema 09/05/2013  . Failure to thrive 09/05/2013  . Paroxysmal atrial fibrillation ([place]) 09/05/2013  . Chronic diastolic CHF (congestive heart failure) ([place]) 09/05/2013    Orientation RESPIRATION BLADDER Height & Weight     Self, Place  Normal Continent Weight: 127 lb 12.8 oz (58 kg) Height:  5\' 1"  (154.9 cm)  BEHAVIORAL SYMPTOMS/MOOD NEUROLOGICAL BOWEL NUTRITION STATUS      Continent    AMBULATORY STATUS COMMUNICATION OF NEEDS Skin   Limited  Assist Verbally Normal                       Personal Care Assistance Level of Assistance  Bathing, Dressing Bathing Assistance: Limited assistance   Dressing Assistance: Limited assistance     Functional Limitations Info             SPECIAL CARE FACTORS FREQUENCY  PT (By licensed PT), OT (By licensed OT)     PT Frequency: 3x/wk with home health OT Frequency: 3x/wk with home health            Contractures      Additional Factors Info  Code Status, Allergies Code Status Info: DNR Allergies Info: [id number]           Current Medications (11/16/2016):  This is the current hospital active medication list Current Facility-Administered Medications  Medication Dose Route Frequency Provider Last Rate Last Dose  . 0.9 %  sodium chloride infusion   Intravenous Continuous Reubin Milan, MD 10 mL/hr at 11/15/16 0200    . acetaminophen (TYLENOL) tablet 650 mg  650 mg Oral Q4H PRN Reubin Milan, MD       Or  . acetaminophen (TYLENOL) solution 650 mg  650 mg Per Tube Q4H PRN Reubin Milan, MD       Or  . acetaminophen (TYLENOL) suppository 650 mg  650 mg Rectal Q4H PRN Reubin Milan, MD      . aspirin tablet 325 mg  325 mg Oral Daily Dhungel, Nishant, MD   325 mg at 11/16/16  1058  . atorvastatin (LIPITOR) tablet 40 mg  40 mg Oral q1800 Charm Rings, NP   40 mg at 11/15/16 1716     Discharge Medications: Please see discharge summary for a list of discharge medications.  Relevant Imaging Results:  Relevant Lab Results:   Additional Information SS#: [id number]  Geralynn Ochs, LCSW

## 2016-11-16 NOTE — Progress Notes (Addendum)
Multiple attempts to contact staff at [facility] but no answer. Patient will be transport back to facility per family at this time. Awaiting on transportation from family. Per daughter will pick pt up at 1630pm after work.   Ave Filter, RN

## 2016-11-16 NOTE — Discharge Summary (Addendum)
Physician Discharge Summary  Alexis Winters:[id number] DOB: October 23, 1927 DOA: 11/14/2016  PCP:  ( seen at the facility)  Admit date: 11/14/2016 Discharge date: 11/16/2016  Admitted From: ALF Disposition:  [facility]  Recommendations for Outpatient Follow-up:  1. Follow-up with M.D. at [place] in 1 week 2. Allowing permissive blood pressure for 5-7 days. Please add to titrate blood pressure medication for tight blood pressure control. (Please consider adding a beta blocker since patient having episodes of increased heart rate per daughter).  Home Health:None Equipment/Devices:None  Discharge Condition: Fair CODE STATUS: DO NOT RESUSCITATE Diet recommendation: Heart Healthy     Discharge Diagnoses:  Principal Problem:   TIA (transient ischemic attack)   Active Problems:   Paroxysmal atrial fibrillation (HCC)   Chronic diastolic CHF (congestive heart failure) (HCC)   Hypokalemia   Dementia without behavioral disturbance   GERD (gastroesophageal reflux disease)   Atrial fibrillation ([place])  Brief narrative/history of present illness Please refer to admission H&P for details, in brief, 83 y.o. female with medical history significant of allergic rhinitis, arrhythmia, breast cancer, chronic diastolic CHF, dementia, hyperlipidemia, enlarged liver, frequent UTIs, GERD, impaired hearing, hemorrhoids, hypertension, hypoglycemia, mitral regurgitation, osteoarthritis prediabetes brought to the ED from ALF with generalized weakness for about a week. On the day of prior to admission she had an episode of vomiting associated with headache, dizziness and unsteady gait. The patient's daughter stated that she was acting and saying things that she normally would not say. When the patient woke up, the symptoms had resolved. However, the patient was complaining of weakness and was subsequently sent to the emergency department. She denied headache, dyspnea, chest pain, nausea, emesis, abdominal pain,  dysuria, frequency or hematuria.  ED Course: Vitals 98.26F, pulse 65, respirations 14, blood pressure 151/69 and O2 sat 99%. Her CBC and urinalysis were normal. Chemistry showed a potassium level of 3.1, total protein is 6.2, albumin at 3.4 and glucose of 119. Neurologist consulted and Placed on observation.  Hospital course Transient ischemic attack (TIA) MRI brain negative for acute stroke. She has significant atherosclerotic changes and volume loss with a dementia. 2-D echo with normal EF and no wall motion abnormality. Carotid Doppler without significant stenosis. Stroke team consulted appreciated. Switched aspirin to full dose and started Lipitor 40 mg daily for LDL of 132. A1c of 5.5. - Allowing permissive blood pressure for next 5-7 days with tight blood pressure control thereafter. -Patient has A. fib but given her advanced dementia and fall risk she is not on anticoagulation.  Seen by PT/OT and recommend home health with 24-hour supervision versus SNF. Patient can be discharged back to ALF with home health.  Paroxysmal atrial fibrillation CHA?DS?-VASc Score of at least 6. Not on anticoagulation due to fall risk and advanced dementia. Now on full dose aspirin.  Chronic diastolic CHF Continue Imdur.  Dementia with behavioral disturbance   Family communication: Daughter updated on the phone  Procedure: MRI brain, 2-D echo, carotid Doppler  Consults: Stroke team Disposition: ALF  Discharge Instructions   Allergies as of 11/16/2016      Reactions   Contrast Media [iodinated Diagnostic Agents] Other (See Comments)   Cardiac arrest   Metoprolol Other (See Comments)   Per the patient's daughter, this causes the patient's heart to race and resulted in syncope (NAME BRAND TOPROL XL ONLY!!)   Pyridium [phenazopyridine Hcl] Anaphylaxis, Shortness Of Breath, Swelling   Levaquin [levofloxacin In D5w] Other (See Comments)   Per MAR   Macrodantin [nitrofurantoin Macrocrystal]  Other (See Comments)   Per MAR   Other Other (See Comments)   PATIENT CANNOT TOLERATE ANY STICKS OR B/P CUFFS ON HER LEFT ARM BECAUSE OF A FORMER SURGERY (16 lymph nodes were removed due to cancer)   Tape Other (See Comments)   PATIENT'S SKIN IS VERY THIN AND WILL "SPLIT"; PLEASE USE COBAN WRAP   Sulfa Antibiotics Other (See Comments)   DEVELOPED INSTANT BLISTERS ON SKIN AND WITHIN THE MOUTH THAT LASTED FOR WEEKS (WITNESSED BY A DOCTOR)      Medication List    STOP taking these medications   aspirin EC 81 MG tablet Replaced by:  aspirin 325 MG tablet   polyethylene glycol packet Commonly known as:  MIRALAX / GLYCOLAX     TAKE these medications   acetaminophen 500 MG tablet Commonly known as:  TYLENOL Take 500 mg by mouth every 4 (four) hours as needed for mild pain, fever or headache.   aspirin 325 MG tablet Take 1 tablet (325 mg total) by mouth daily. Replaces:  aspirin EC 81 MG tablet   atorvastatin 40 MG tablet Commonly known as:  LIPITOR Take 1 tablet (40 mg total) by mouth daily at 6 PM.   Cranberry 450 MG Tabs Take 450 mg by mouth 2 (two) times daily.   furosemide 40 MG tablet Commonly known as:  LASIX Take 40 mg by mouth 2 (two) times daily.   guaifenesin 100 MG/5ML syrup Commonly known as:  ROBITUSSIN Take 200 mg by mouth every 6 (six) hours as needed for cough.   HCA TRIPLE ANTIBIOTIC OINTMENT EX Apply 1 application topically daily as needed (skin tears/abrasions).   ibuprofen 200 MG tablet Commonly known as:  ADVIL,MOTRIN Take 200 mg by mouth every 6 (six) hours as needed (back pain). Reported on 06/22/2015   isosorbide mononitrate 30 MG 24 hr tablet Commonly known as:  IMDUR Take 1 tablet (30 mg total) by mouth daily.   loperamide 2 MG capsule Commonly known as:  IMODIUM Take 2 mg by mouth daily as needed for diarrhea or loose stools.   magnesium hydroxide 400 MG/5ML suspension Commonly known as:  MILK OF MAGNESIA Take 30 mLs by mouth at bedtime  as needed for mild constipation.   methenamine 1 g tablet Commonly known as:  MANDELAMINE Take 1,000 mg by mouth 2 (two) times daily.   [place] 200-200-20 MG/5ML suspension Generic drug:  alum & mag hydroxide-simeth Take 30 mLs by mouth every 6 (six) hours as needed for indigestion or heartburn.   MYRBETRIQ 25 MG Tb24 tablet Generic drug:  mirabegron ER Take 25 mg by mouth daily.   pantoprazole 40 MG tablet Commonly known as:  PROTONIX Take 40 mg by mouth daily.   potassium chloride 10 MEQ tablet Commonly known as:  K-DUR Take 2 tablets (20 mEq total) by mouth 3 (three) times daily.   vitamin C 500 MG tablet Commonly known as:  ASCORBIC ACID Take 500 mg by mouth daily.            Discharge Care Instructions        Start     Ordered   11/17/16 0000  aspirin 325 MG tablet  Daily     11/16/16 1431   11/16/16 0000  atorvastatin (LIPITOR) 40 MG tablet  Daily-1800     11/16/16 1431     Follow-up Information    MD at ALF in 1 week Follow up in 1 week(s).          Allergies  Allergen Reactions  . Contrast Media [Iodinated Diagnostic Agents] Other (See Comments)    Cardiac arrest  . Metoprolol Other (See Comments)    Per the patient's daughter, this causes the patient's heart to race and resulted in syncope (NAME BRAND TOPROL XL ONLY!!)  . Pyridium [Phenazopyridine Hcl] Anaphylaxis, Shortness Of Breath and Swelling  . Levaquin [Levofloxacin In D5w] Other (See Comments)    Per MAR  . Macrodantin [Nitrofurantoin Macrocrystal] Other (See Comments)    Per MAR  . Other Other (See Comments)    PATIENT CANNOT TOLERATE ANY STICKS OR B/P CUFFS ON HER LEFT ARM BECAUSE OF A FORMER SURGERY (16 lymph nodes were removed due to cancer)  . Tape Other (See Comments)    PATIENT'S SKIN IS VERY THIN AND WILL "SPLIT"; PLEASE USE COBAN WRAP  . Sulfa Antibiotics Other (See Comments)    DEVELOPED INSTANT BLISTERS ON SKIN AND WITHIN THE MOUTH THAT LASTED FOR WEEKS (WITNESSED BY A DOCTOR)       Procedures/Studies: Dg Chest 2 View  Result Date: 11/15/2016 CLINICAL DATA:  83 year old female with 1 week of generalized weakness. Headache and vomiting. Dizziness and unsteady gait. EXAM: CHEST  2 VIEW COMPARISON:  11/05/2016 and earlier. FINDINGS: Upright AP and lateral views of the chest. Improved lung volumes and bibasilar ventilation. Stable mediastinal contours. Chronic gastric hiatal hernia. Cardiac size within normal limits. Calcified aortic atherosclerosis. No pneumothorax, pulmonary edema, pleural effusion or confluent pulmonary opacity. Osteopenia. Postoperative changes to the left chest wall. Negative visible bowel gas pattern. IMPRESSION: No acute cardiopulmonary abnormality. Electronically Signed   By: Genevie Ann M.D.   On: 11/15/2016 08:24   Dg Chest 2 View  Result Date: 11/05/2016 CLINICAL DATA:  Sudden chest pain with SVT EXAM: CHEST  2 VIEW COMPARISON:  10/26/2016 FINDINGS: Surgical clips over the left breast. Slight elevation of left diaphragm as before. No acute consolidation or effusion. Heart size upper normal. Aortic atherosclerosis. No pneumothorax. Moderate hiatal hernia. IMPRESSION: 1. Borderline cardiomegaly.  No edema or infiltrate 2. Moderate hiatal hernia Electronically Signed   By: Donavan Foil M.D.   On: 11/05/2016 18:22   Dg Chest 2 View  Result Date: 10/26/2016 CLINICAL DATA:  Chest pain with increased heart rate.  SVT. EXAM: CHEST  2 VIEW COMPARISON:  09/11/2016 FINDINGS: Normal heart size and pulmonary vascularity. No focal airspace disease or consolidation in the lungs. No blunting of costophrenic angles. No pneumothorax. Mediastinal contours appear intact. Azygos lobe. Calcification of the aorta. Degenerative changes in the spine and shoulders. Surgical clips in the left breast. IMPRESSION: No active cardiopulmonary disease. Electronically Signed   By: Lucienne Capers M.D.   On: 10/26/2016 22:49   Mr Brain Wo Contrast  Result Date: 11/15/2016 CLINICAL  DATA:  83 year old female with 1 week of generalized weakness. Headache and vomiting. Dizziness and unsteady gait. EXAM: MRI HEAD WITHOUT CONTRAST MRA HEAD WITHOUT CONTRAST TECHNIQUE: Multiplanar, multiecho pulse sequences of the brain and surrounding structures were obtained without intravenous contrast. Angiographic images of the head were obtained using MRA technique without contrast. COMPARISON:  Head CT without contrast 04/27/2016 and earlier. FINDINGS: MRI HEAD FINDINGS Study is intermittently degraded by motion artifact despite repeated imaging attempts. Brain: Fairly generalized cerebral volume loss. Somewhat heterogeneous diffusion signal, including in the medulla, but no convincing restricted diffusion to suggest acute infarction. No midline shift, mass effect, evidence of mass lesion, ventriculomegaly, extra-axial collection or acute intracranial hemorrhage. Cervicomedullary junction and pituitary are within normal limits. Patchy and confluent bilateral cerebral  white matter T2 and FLAIR hyperintensity. No cortical encephalomalacia. No definite chronic cerebral blood products. T1 and T2 signal in the deep gray matter nuclei, brainstem, and cerebellum is within normal limits. Vascular: Major intracranial vascular flow voids are preserved. Skull and upper cervical spine: Negative. Normal bone marrow signal. Sinuses/Orbits: Negative orbits soft tissues. Visualized paranasal sinuses and mastoids are stable and well pneumatized. Other: Negative scalp soft tissues. MRA HEAD FINDINGS Mildly to moderately degraded by motion artifact. Antegrade flow in the posterior circulation. Dominant appearing distal left vertebral artery. Patent distal right vertebral artery and right PICA origin. Patent vertebrobasilar junction, tortuous and with mild to moderate irregularity but no significant stenosis. Patent origin of the left AICA which may be dominant. Additional basilar artery irregularity without stenosis. SCA and PCA  origins are patent. Posterior communicating arteries are diminutive or absent. Moderate to severe regularity in stenosis throughout the left PCA P1 and P2 segments but preserved bilateral distal PCA flow. Antegrade flow in both ICA siphons. Patent carotid termini. Moderate to severe left supraclinoid ICA stenosis. Patent MCA and ACA origins. Moderate bilateral M1 and A1 segment irregularity. Anterior communicating artery is present. MCA M1 segments and bifurcations are patent. MCA and ACA second order branch detail is limited by motion. IMPRESSION: 1. Motion degraded exam with no convincing acute infarct, and no large vessel occlusion. 2. Widespread intracranial atherosclerosis with moderate to severe distal left ICA and left PCA stenoses. 3. Generalized cerebral volume loss, and generalized cerebral white matter signal changes most commonly due to chronic small vessel disease. Electronically Signed   By: Genevie Ann M.D.   On: 11/15/2016 08:23   Mr Jodene Nam Head Wo Contrast  Result Date: 11/15/2016 CLINICAL DATA:  83 year old female with 1 week of generalized weakness. Headache and vomiting. Dizziness and unsteady gait. EXAM: MRI HEAD WITHOUT CONTRAST MRA HEAD WITHOUT CONTRAST TECHNIQUE: Multiplanar, multiecho pulse sequences of the brain and surrounding structures were obtained without intravenous contrast. Angiographic images of the head were obtained using MRA technique without contrast. COMPARISON:  Head CT without contrast 04/27/2016 and earlier. FINDINGS: MRI HEAD FINDINGS Study is intermittently degraded by motion artifact despite repeated imaging attempts. Brain: Fairly generalized cerebral volume loss. Somewhat heterogeneous diffusion signal, including in the medulla, but no convincing restricted diffusion to suggest acute infarction. No midline shift, mass effect, evidence of mass lesion, ventriculomegaly, extra-axial collection or acute intracranial hemorrhage. Cervicomedullary junction and pituitary are  within normal limits. Patchy and confluent bilateral cerebral white matter T2 and FLAIR hyperintensity. No cortical encephalomalacia. No definite chronic cerebral blood products. T1 and T2 signal in the deep gray matter nuclei, brainstem, and cerebellum is within normal limits. Vascular: Major intracranial vascular flow voids are preserved. Skull and upper cervical spine: Negative. Normal bone marrow signal. Sinuses/Orbits: Negative orbits soft tissues. Visualized paranasal sinuses and mastoids are stable and well pneumatized. Other: Negative scalp soft tissues. MRA HEAD FINDINGS Mildly to moderately degraded by motion artifact. Antegrade flow in the posterior circulation. Dominant appearing distal left vertebral artery. Patent distal right vertebral artery and right PICA origin. Patent vertebrobasilar junction, tortuous and with mild to moderate irregularity but no significant stenosis. Patent origin of the left AICA which may be dominant. Additional basilar artery irregularity without stenosis. SCA and PCA origins are patent. Posterior communicating arteries are diminutive or absent. Moderate to severe regularity in stenosis throughout the left PCA P1 and P2 segments but preserved bilateral distal PCA flow. Antegrade flow in both ICA siphons. Patent carotid termini. Moderate to severe left  supraclinoid ICA stenosis. Patent MCA and ACA origins. Moderate bilateral M1 and A1 segment irregularity. Anterior communicating artery is present. MCA M1 segments and bifurcations are patent. MCA and ACA second order branch detail is limited by motion. IMPRESSION: 1. Motion degraded exam with no convincing acute infarct, and no large vessel occlusion. 2. Widespread intracranial atherosclerosis with moderate to severe distal left ICA and left PCA stenoses. 3. Generalized cerebral volume loss, and generalized cerebral white matter signal changes most commonly due to chronic small vessel disease. Electronically Signed   By: Genevie Ann M.D.   On: 11/15/2016 08:23    2-D echo  Study Conclusions  - Left ventricle: The cavity size was normal. Wall thickness was   increased in a pattern of moderate LVH. Systolic function was   normal. The estimated ejection fraction was in the range of 60%   to 65%. Wall motion was normal; there were no regional wall   motion abnormalities. Doppler parameters are consistent with   abnormal left ventricular relaxation (grade 1 diastolic   dysfunction). - Aortic valve: There was no stenosis. - Mitral valve: Mildly calcified annulus. There was trivial   regurgitation. - Left atrium: The atrium was mildly dilated. - Right ventricle: The cavity size was normal. Systolic function   was normal. - Tricuspid valve: There was moderate regurgitation. Peak RV-RA   gradient (S): 26 mm Hg. - Pulmonary arteries: PA peak pressure: 29 mm Hg (S). - Inferior vena cava: The vessel was normal in size. The   respirophasic diameter changes were in the normal range (= 50%),   consistent with normal central venous pressure.  Impressions:  - Normal LV size with moderate LV hypertrophy. EF 60-65%. Normal RV   size nad systolic function. Moderate TR.  Subjective: Very confused . No overnight issues.  Discharge Exam: Vitals:   11/16/16 0536 11/16/16 1040  BP: 137/71 (!) 155/62  Pulse: 67 62  Resp: 18 18  Temp: 98.5 F (36.9 C) 98.6 F (37 C)  SpO2: 97% 99%   Vitals:   11/15/16 2048 11/16/16 0121 11/16/16 0536 11/16/16 1040  BP: 138/62 128/72 137/71 (!) 155/62  Pulse: 71 68 67 62  Resp: 15 14 18 18   Temp: 98.3 F (36.8 C) 98.6 F (37 C) 98.5 F (36.9 C) 98.6 F (37 C)  TempSrc: Oral Oral Oral Oral  SpO2: 97% 98% 97% 99%  Weight:      Height:       General: elderly female not in distress HEENT: moist mucosa, supple neck Chest: clear b/l no added sounds CVS: S1&S2, regular, no murmurs GI: soft, NT, ND,  Musculoskeletal: Warm, no edema CNS: Alert and awake, not  oriented    The results of significant diagnostics from this hospitalization (including imaging, microbiology, ancillary and laboratory) are listed below for reference.     Microbiology: Recent Results (from the past 240 hour(s))  MRSA PCR Screening     Status: None   Collection Time: 11/15/16  2:21 AM  Result Value Ref Range Status   MRSA by PCR NEGATIVE NEGATIVE Final    Comment:        The GeneXpert MRSA Assay (FDA approved for NASAL specimens only), is one component of a comprehensive MRSA colonization surveillance program. It is not intended to diagnose MRSA infection nor to guide or monitor treatment for MRSA infections.      Labs: BNP (last 3 results)  Recent Labs  05/07/16 1550 05/28/16 1531 09/11/16 1513  BNP 152.2* 336.3*  161.0*   Basic Metabolic Panel:  Recent Labs Lab 11/14/16 2142 11/15/16 0539  NA 142 142  K 3.1* 3.5  CL 106 108  CO2 28 27  GLUCOSE 119* 104*  BUN 15 15  CREATININE 0.77 0.76  CALCIUM 9.0 8.8*  MG  --  2.2   Liver Function Tests:  Recent Labs Lab 11/14/16 2142  AST 27  ALT 18  ALKPHOS 84  BILITOT 0.7  PROT 6.3*  ALBUMIN 3.4*    Recent Labs Lab 11/14/16 2142  LIPASE 36   No results for input(s): AMMONIA in the last 168 hours. CBC:  Recent Labs Lab 11/14/16 2142  WBC 6.3  NEUTROABS 4.2  HGB 13.7  HCT 40.9  MCV 85.7  PLT 158   Cardiac Enzymes: No results for input(s): CKTOTAL, CKMB, CKMBINDEX, TROPONINI in the last 168 hours. BNP: Invalid input(s): POCBNP CBG: No results for input(s): GLUCAP in the last 168 hours. D-Dimer No results for input(s): DDIMER in the last 72 hours. Hgb A1c  Recent Labs  11/15/16 0539  HGBA1C 5.5   Lipid Profile  Recent Labs  11/15/16 0539  CHOL 198  HDL 50  LDLCALC 132*  TRIG 79  CHOLHDL 4.0   Thyroid function studies No results for input(s): TSH, T4TOTAL, T3FREE, THYROIDAB in the last 72 hours.  Invalid input(s): FREET3 Anemia work up No results for  input(s): VITAMINB12, FOLATE, FERRITIN, TIBC, IRON, RETICCTPCT in the last 72 hours. Urinalysis    Component Value Date/Time   COLORURINE COLORLESS (A) 11/14/2016 2126   APPEARANCEUR CLEAR 11/14/2016 2126   LABSPEC 1.005 11/14/2016 2126   PHURINE 8.0 11/14/2016 2126   GLUCOSEU NEGATIVE 11/14/2016 2126   HGBUR NEGATIVE 11/14/2016 2126   BILIRUBINUR NEGATIVE 11/14/2016 2126   KETONESUR NEGATIVE 11/14/2016 2126   PROTEINUR NEGATIVE 11/14/2016 2126   UROBILINOGEN 0.2 09/09/2016 2030   NITRITE NEGATIVE 11/14/2016 2126   LEUKOCYTESUR NEGATIVE 11/14/2016 2126   Sepsis Labs Invalid input(s): PROCALCITONIN,  WBC,  LACTICIDVEN Microbiology Recent Results (from the past 240 hour(s))  MRSA PCR Screening     Status: None   Collection Time: 11/15/16  2:21 AM  Result Value Ref Range Status   MRSA by PCR NEGATIVE NEGATIVE Final    Comment:        The GeneXpert MRSA Assay (FDA approved for NASAL specimens only), is one component of a comprehensive MRSA colonization surveillance program. It is not intended to diagnose MRSA infection nor to guide or monitor treatment for MRSA infections.      Time coordinating discharge: <30 minutes  SIGNED:   Louellen Molder, MD  [facility] 11/16/2016, 2:32 PM Pager   If 7PM-7AM, please contact night-coverage [email or web address] Password [email or web address]

## 2016-11-16 NOTE — Clinical Social Work Note (Signed)
Clinical Social Work Assessment  Patient Details  Name: Alexis Winters MRN: [id number] Date of Birth: [birth year]  Date of referral:  11/15/16               Reason for consult:  Facility Placement, Discharge Planning                Permission sought to share information with:  Facility Sport and exercise psychologist, Family Supports Permission granted to share information::  Yes, Verbal Permission Granted  Name::     Special educational needs teacher::  SNF  Relationship::  Daughter  Contact Information:     Housing/Transportation Living arrangements for the past 2 months:  [place] of Information:  Adult Children Patient Interpreter Needed:  None Criminal Activity/Legal Involvement Pertinent to Current Situation/Hospitalization:  No - Comment as needed Significant Relationships:  Adult Children Lives with:  Self, Facility Resident Do you feel safe going back to the place where you live?  Yes Need for family participation in patient care:  Yes (Comment) (patient has dementia)  Care giving concerns:  Patient has been residing at [facility] and has no concerns about care received there.   Social Worker assessment / plan:  CSW met with patient to confirm patient has been living at [facility]. Patient acknowledged that she loves where she lives and that they take great care of her over there. Patient indicated that her daughter helps with taking care of her, too, if CSW wanted to call patient's daughter. CSW will follow up with patient's daughter to ensure transition to back to patient's ALF when medically ready.  Employment status:  Retired Forensic scientist:  Medicare PT Recommendations:  Not assessed at this time Information / Referral to community resources:     Patient/Family's Response to care:  Patient agreeable to return to ALF.  Patient/Family's Understanding of and Emotional Response to Diagnosis, Current Treatment, and Prognosis:  Patient discussed how happy she is  with where she's living, and how it was difficult for her to decide that she couldn't live by herself anymore, but she is happy to be where she is. Patient appreciated help from CSW to get back home when medically ready.  Emotional Assessment Appearance:  Appears stated age Attitude/Demeanor/Rapport:    Affect (typically observed):  Appropriate Orientation:  Oriented to Self, Oriented to Place Alcohol / Substance use:  Not Applicable Psych involvement (Current and /or in the community):  No (Comment)  Discharge Needs  Concerns to be addressed:  Discharge Planning Concerns, Care Coordination Readmission within the last 30 days:  No Current discharge risk:  Physical Impairment, Cognitively Impaired Barriers to Discharge:  Continued Medical Work up   [employer], [place] 11/16/2016, 9:07 AM

## 2016-11-16 NOTE — Progress Notes (Signed)
STROKE TEAM PROGRESS NOTE   SUBJECTIVE (INTERVAL HISTORY) No family is at the bedside.  The patient is awake and confused.  TTE and TCD unremarkable.     OBJECTIVE Temp:  [98.1 F (36.7 C)-98.6 F (37 C)] 98.6 F (37 C) (08/23 1040) Pulse Rate:  [62-71] 62 (08/23 1040) Cardiac Rhythm: Heart block (08/23 0700) Resp:  [14-18] 18 (08/23 1040) BP: (128-155)/(56-72) 155/62 (08/23 1040) SpO2:  [96 %-99 %] 99 % (08/23 1040)  CBC:   Recent Labs Lab 11/14/16 2142  WBC 6.3  NEUTROABS 4.2  HGB 13.7  HCT 40.9  MCV 85.7  PLT 962    Basic Metabolic Panel:   Recent Labs Lab 11/14/16 2142 11/15/16 0539  NA 142 142  K 3.1* 3.5  CL 106 108  CO2 28 27  GLUCOSE 119* 104*  BUN 15 15  CREATININE 0.77 0.76  CALCIUM 9.0 8.8*  MG  --  2.2    Lipid Panel:     Component Value Date/Time   CHOL 198 11/15/2016 0539   TRIG 79 11/15/2016 0539   HDL 50 11/15/2016 0539   CHOLHDL 4.0 11/15/2016 0539   VLDL 16 11/15/2016 0539   LDLCALC 132 (H) 11/15/2016 0539   HgbA1c:  Lab Results  Component Value Date   HGBA1C 5.5 11/15/2016   Urine Drug Screen: No results found for: LABOPIA, COCAINSCRNUR, LABBENZ, AMPHETMU, THCU, LABBARB  Alcohol Level No results found for: [facility]  IMAGING  Dg Chest 2 View 11/15/2016 IMPRESSION: No acute cardiopulmonary abnormality.  Mr Brain 56 Contrast Mr Alexis Winters Wo Contrast 11/15/2016 IMPRESSION: 1. Motion degraded exam with no convincing acute infarct, and no large vessel occlusion. 2. Widespread intracranial atherosclerosis with moderate to severe distal left ICA and left PCA stenoses. 3. Generalized cerebral volume loss, and generalized cerebral white matter signal changes most commonly due to chronic small vessel disease.   TTE 11/15/2016 Study Conclusions - Left ventricle: The cavity size was normal. Wall thickness was increased in a pattern of moderate LVH. Systolic function was normal. The estimated ejection fraction was in the range of 60%  to  65%. Wall motion was normal; there were no regional wall motion abnormalities. Doppler parameters are consistent with abnormal left ventricular relaxation (grade 1 diastolic dysfunction). - Aortic valve: There was no stenosis. - Mitral valve: Mildly calcified annulus. There was trivial regurgitation. - Left atrium: The atrium was mildly dilated. - Right ventricle: The cavity size was normal. Systolic function was normal. - Tricuspid valve: There was moderate regurgitation. Peak RV-RA gradient (S): 26 mm Hg. - Pulmonary arteries: PA peak pressure: 29 mm Hg (S). - Inferior vena cava: The vessel was normal in size. The respirophasic diameter changes were in the normal range (= 50%), consistent with normal central venous pressure. Impressions: - Normal LV size with moderate LV hypertrophy. EF 60-65%. Normal RV   size nad systolic function. Moderate TR.  Carotid US 11/15/2016 Bilateral:  1-39% ICA stenosis.  Vertebral artery flow is antegrade  TCD 11/15/2016 Summary: Technically difficult due to movement, vocal interference, and poor and absent windows.  Normal mean flow velocities in bilateral opthalmics, carotid siphons, vertebrals and basilar arteries.    PHYSICAL EXAM Obese elderly lad not in distress. . Afebrile. Head is nontraumatic. Neck is supple without bruit.    Cardiac exam no murmur or gallop. Lungs are clear to auscultation. Distal pulses are not well felt.Significant bilateral lower extremity edema up to the knees Neurological Exam :  Awake alert oriented to person only.  Diminished attention, registration and recall. Poor insight and fund of knowledge. Limited speech output but fluent. Follows simple midline and occasional one-step commands only. No dysarthria or aphasia. Pupils irregular reactive. Fundi not visualized. Vision acuity seems adequate. Extraocular moments are full range without nystagmus. Blinks to threat bilaterally. No facial weakness. Tongue midline. Bilateral  palmomental reflexes present. Motor system exam able to move all 4 extremities against gravity but effort is poor. No focal weakness. Touch pinprick sensation preserved bilaterally. The tendon reflexes are 1+. Plantars downgoing. Gait not tested.   ASSESSMENT/PLAN Alexis Winters is a 83 y.o. female with history of atrial fibrillation, breast cancer, CHF, dementia, frequent UTI, hypertension, mitral regurgitation, and prediabetes who presented to the ED on Tuesday evening with dizziness, stumbling gait and vomiting. She did not receive IV t-PA due to late presentation.  Discharge pending to [facility].  No stroke  Resultant  loss stroke related deficits. Baseline dementia.  CT head: not ordered  MRI head: no acute infarct  MRA head: Diffuse atherosclerosis and moderate to severe distal left ICA and left PCA stenosis  Carotid Doppler: B ICA 1-39% stenosis, VAs antegrade  2D Echo  unremarkable   TCD:  Technically limited but no large vessel stenosis   LDL 132  HgbA1c 5.5  SCDs for VTE prophylaxis Diet Heart Room service appropriate? Yes; Fluid consistency: Thin  aspirin 81 mg daily prior to admission, now on No antithrombotic  Patient counseled to be compliant with her antithrombotic medications  Ongoing aggressive stroke risk factor management  Therapy recommendations:  pending  Disposition:   pending  Hypertension  Stable Permissive hypertension (OK if < 220/120) but gradually normalize in 5-7 days Long-term BP goal normotensive  Hyperlipidemia  Home meds:  none  LDL 132, goal < 70  Add atorvastatin 40 mg PO daily  Continue statin at discharge  Diabetes  HgbA1c 5.5, goal < 7.0  Controlled  Other Stroke Risk Factors  Advanced age  History of atrial fibrillation without anticoagulation  CHF  History of breast cancer  Other Active Problems  None  Hospital day # 1  I have personally examined this patient, reviewed notes, independently  viewed imaging studies, participated in medical decision making and plan of care.ROS completed by me personally and pertinent positives fully documented  I have made any additions or clarifications directly to the above note.. She presented with dizziness and stumbling without focal deficits and MRI scan does not show any convincing acute infarct. She does have widespread atherosclerotic changes and generalized volume loss which is compatible with her dementia.Continue aspirin and statin for stroke prevention  Patient has atrial fibrillation but is not a good long-term anticoagulation candidate given her dementia and fall risk. Stroke team will sign off. Kindly call for questions.  Antony Contras, MD Medical Director [facility] Pager: [phone] 11/16/2016 3:17 PM   To contact Stroke Continuity provider, please refer to [email or web address] After hours, contact General Neurology

## 2016-11-16 NOTE — Care Management Note (Signed)
Case Management Note  Patient Details  Name: KYNLEA BLACKSTON MRN: [id number] Date of Birth: 08-05-1927  Subjective/Objective:                 Patient admitted from [facility]. PT rec HH PT. Spoke w CSW she verified ALF will arrange [facility] PT. Patient will need order for [facility] PT w F2F, at time of DC.     Action/Plan:  CM/ CSW will continue to follow.  Expected Discharge Date:                  Expected Discharge Plan:  [place] ([place])  In-House Referral:     Discharge planning Services  CM Consult  Post Acute Care Choice:    Choice offered to:     DME Arranged:    DME Agency:     HH Arranged:    HH Agency:     Status of Service:  In process, will continue to follow  If discussed at Long Length of Stay Meetings, dates discussed:    Additional Comments:  Carles Collet, RN 11/16/2016, 2:07 PM

## 2016-12-08 ENCOUNTER — Ambulatory Visit: Payer: Medicare Other | Admitting: Cardiovascular Disease

## 2016-12-08 ENCOUNTER — Emergency Department (HOSPITAL_COMMUNITY): Payer: Medicare Other

## 2016-12-08 ENCOUNTER — Encounter (HOSPITAL_COMMUNITY): Payer: Self-pay | Admitting: *Deleted

## 2016-12-08 ENCOUNTER — Emergency Department (HOSPITAL_COMMUNITY)
Admission: EM | Admit: 2016-12-08 | Discharge: 2016-12-09 | Disposition: A | Discharge status: Home / Self Care | Payer: Medicare Other | Attending: Emergency Medicine | Admitting: Emergency Medicine

## 2016-12-08 DIAGNOSIS — Z79899 Other long term (current) drug therapy: Secondary | ICD-10-CM | POA: Diagnosis not present

## 2016-12-08 DIAGNOSIS — R079 Chest pain, unspecified: Secondary | ICD-10-CM | POA: Diagnosis not present

## 2016-12-08 DIAGNOSIS — Z7982 Long term (current) use of aspirin: Secondary | ICD-10-CM | POA: Insufficient documentation

## 2016-12-08 DIAGNOSIS — R519 Headache, unspecified: Secondary | ICD-10-CM

## 2016-12-08 DIAGNOSIS — I10 Essential (primary) hypertension: Secondary | ICD-10-CM | POA: Diagnosis not present

## 2016-12-08 DIAGNOSIS — R51 Headache: Secondary | ICD-10-CM | POA: Diagnosis not present

## 2016-12-08 LAB — CBC WITH DIFFERENTIAL/PLATELET
Basophils Absolute: 0 10*3/uL (ref 0.0–0.1)
Basophils Relative: 1 %
EOS ABS: 0.2 10*3/uL (ref 0.0–0.7)
EOS PCT: 3 %
HCT: 41.2 % (ref 36.0–46.0)
Hemoglobin: 14.2 g/dL (ref 12.0–15.0)
LYMPHS ABS: 1.6 10*3/uL (ref 0.7–4.0)
LYMPHS PCT: 25 %
MCH: 29.6 pg (ref 26.0–34.0)
MCHC: 34.5 g/dL (ref 30.0–36.0)
MCV: 85.8 fL (ref 78.0–100.0)
MONO ABS: 0.4 10*3/uL (ref 0.1–1.0)
Monocytes Relative: 7 %
Neutro Abs: 4.2 10*3/uL (ref 1.7–7.7)
Neutrophils Relative %: 64 %
PLATELETS: 149 10*3/uL — AB (ref 150–400)
RBC: 4.8 MIL/uL (ref 3.87–5.11)
RDW: 13.2 % (ref 11.5–15.5)
WBC: 6.5 10*3/uL (ref 4.0–10.5)

## 2016-12-08 LAB — I-STAT TROPONIN, ED: Troponin i, poc: 0.01 ng/mL (ref 0.00–0.08)

## 2016-12-08 MED ORDER — ASPIRIN 81 MG PO CHEW
324.0000 mg | CHEWABLE_TABLET | Freq: Once | ORAL | Status: AC
  Administered 2016-12-08: 324 mg via ORAL
  Filled 2016-12-08: qty 4

## 2016-12-08 MED ORDER — DIPHENHYDRAMINE HCL 50 MG/ML IJ SOLN
25.0000 mg | Freq: Once | INTRAMUSCULAR | Status: AC
  Administered 2016-12-08: 25 mg via INTRAVENOUS
  Filled 2016-12-08: qty 1

## 2016-12-08 MED ORDER — METOCLOPRAMIDE HCL 5 MG/ML IJ SOLN
10.0000 mg | Freq: Once | INTRAMUSCULAR | Status: AC
  Administered 2016-12-08: 10 mg via INTRAVENOUS
  Filled 2016-12-08: qty 2

## 2016-12-08 MED ORDER — SODIUM CHLORIDE 0.9 % IV BOLUS (SEPSIS)
1000.0000 mL | Freq: Once | INTRAVENOUS | Status: AC
  Administered 2016-12-08: 1000 mL via INTRAVENOUS

## 2016-12-08 NOTE — ED Triage Notes (Signed)
Pt's daughter reports between 1930-2000, she received a call from the nsg home stating that pt was c/o chest pain.  She went to the facility, pt needed to use the BR, when she got up, pt became "pale" and hypotensive.  She reports pt had SVT and TIAs in the past.

## 2016-12-08 NOTE — ED Triage Notes (Signed)
Per EMS, pt from [facility], staff reports pt c/o h/a for the past 30 minutes.  Pt is alert and oriented per norm.  Pt has hx of dementia.  No other complaints.

## 2016-12-08 NOTE — ED Notes (Signed)
RN to collect labs from IV.

## 2016-12-08 NOTE — ED Provider Notes (Signed)
[place] DEPT Provider Note   CSN: [id number] Arrival date & time: 12/08/16  2140     History   Chief Complaint Chief Complaint  Patient presents with  . Chest Pain    HPI Alexis Winters is a 83 y.o. female.  HPI  Patient, with past medical history of A. Fib, SVT, CHF, HTN, dementia, presents to ED for evaluation of headache, R sided chest pain that has been going on for the past few hours. She lives in a memory unit of a skilled nursing facility and staff reported that she was complaining of headache. They deny any confusion. Currently, she states that all symptoms have resolved. She states that "I feel fine right now."  Past Medical History:  Diagnosis Date  . Allergic rhinitis   . Arrhythmia    a. office to call pt on 09/15/13 for 30 day event monitor  . Atrial fibrillation ([place])    a. h/o PAF, in NSR during admission 09/11/13  . Breast cancer ([place])   . Chronic diastolic CHF (congestive heart failure) ([place])    a. echo 09/12/13 EF 54-65%, grade 1 diastolic dysf, mild LVOT obstr, moderate MR, elevated filling pressure  . Dementia   . Dyslipidemia   . Enlarged liver   . Frequent UTI   . GERD (gastroesophageal reflux disease)   . Hard of hearing   . Hemorrhoids   . Hypertension   . Hypoglycemia   . Mitral regurgitation    a. moderate MR by echo 09/12/2013  . OA (osteoarthritis)    hip and back  . Orthostatic hypotension   . Phlebitis    l leg  . Prediabetes     Patient Active Problem List   Diagnosis Date Noted  . TIA (transient ischemic attack) 11/15/2016  . GERD (gastroesophageal reflux disease) 11/15/2016  . Atrial fibrillation ([place])   . SVT (supraventricular tachycardia) ([place]) 08/18/2016  . Dementia without behavioral disturbance 08/18/2016  . UTI (urinary tract infection) 05/29/2016  . Hypokalemia 04/28/2016  . Rash and nonspecific skin eruption 04/28/2016  . Bradycardia 04/27/2016  . Atrial flutter, paroxysmal ([place]) 06/22/2015  . Near syncope  01/10/2015  . Neck pain on left side 01/10/2015  . Pulmonary nodule, right 01/10/2015  . Leukocytosis 01/10/2015  . Hypocalcemia 01/10/2015  . Dyspnea 01/10/2015  . Chest pain 09/11/2013  . Bilateral neck pain 09/11/2013  . Angina at rest [facility]) 09/11/2013  . Leg edema 09/05/2013  . Failure to thrive 09/05/2013  . Paroxysmal atrial fibrillation ([place]) 09/05/2013  . Chronic diastolic CHF (congestive heart failure) ([place]) 09/05/2013    Past Surgical History:  Procedure Laterality Date  . ABDOMINAL HYSTERECTOMY    . BLADDER SUSPENSION    . BREAST LUMPECTOMY    . CARDIAC CATHETERIZATION  1980's   [facility]  . FOOT SURGERY     left foot  . INNER EAR SURGERY      OB History    No data available       Home Medications    Prior to Admission medications   Medication Sig Start Date End Date Taking? Authorizing Provider  acetaminophen (TYLENOL) 500 MG tablet Take 500 mg by mouth every 4 (four) hours as needed for mild pain, fever or headache.    Yes [provider]  alum & mag hydroxide-simeth ([place]) 200-200-20 MG/5ML suspension Take 30 mLs by mouth every 6 (six) hours as needed for indigestion or heartburn.   Yes [provider]  aspirin 325 MG tablet Take 1 tablet (325  mg total) by mouth daily. 11/17/16  Yes Dhungel, Nishant, MD  atorvastatin (LIPITOR) 40 MG tablet Take 1 tablet (40 mg total) by mouth daily at 6 PM. 11/16/16  Yes Dhungel, Nishant, MD  Cranberry 450 MG TABS Take 450 mg by mouth 2 (two) times daily.    Yes [provider]  furosemide (LASIX) 40 MG tablet Take 40 mg by mouth 2 (two) times daily.  04/09/12  Yes [provider]  guaifenesin (ROBITUSSIN) 100 MG/5ML syrup Take 200 mg by mouth every 6 (six) hours as needed for cough.   Yes [provider]  ibuprofen (ADVIL,MOTRIN) 200 MG tablet Take 200 mg by mouth every 6 (six) hours as needed (back pain). Reported on 06/22/2015   Yes [provider]  isosorbide mononitrate  (IMDUR) 30 MG 24 hr tablet Take 1 tablet (30 mg total) by mouth daily. 10/13/15  Yes Nahser, Wonda Cheng, MD  loperamide (IMODIUM) 2 MG capsule Take 2 mg by mouth daily as needed for diarrhea or loose stools.   Yes [provider]  magnesium hydroxide (MILK OF MAGNESIA) 400 MG/5ML suspension Take 30 mLs by mouth at bedtime as needed for mild constipation.   Yes [provider]  methenamine (MANDELAMINE) 1 g tablet Take 1,000 mg by mouth 2 (two) times daily.   Yes [provider]  mirabegron ER (MYRBETRIQ) 25 MG TB24 tablet Take 25 mg by mouth daily.   Yes [provider]  Neomycin-Bacitracin-Polymyxin (HCA TRIPLE ANTIBIOTIC OINTMENT EX) Apply 1 application topically daily as needed (skin tears/abrasions).    Yes [provider]  pantoprazole (PROTONIX) 40 MG tablet Take 40 mg by mouth daily. 10/19/14  Yes [provider]  potassium chloride (K-DUR) 10 MEQ tablet Take 2 tablets (20 mEq total) by mouth 3 (three) times daily. 06/21/16  Yes Sherwood Gambler, MD  vitamin C (ASCORBIC ACID) 500 MG tablet Take 500 mg by mouth daily.   Yes [provider]    Family History Family History  Problem Relation Age of Onset  . Cerebral palsy Sister   . Alzheimer's disease Brother   . Hypertension Father   . Heart disease Brother     Social History Social History  Substance Use Topics  . Smoking status: Former Research scientist (life sciences)  . Smokeless tobacco: Never Used  . Alcohol use No     Allergies   Contrast media [iodinated diagnostic agents]; Metoprolol; Pyridium [phenazopyridine hcl]; Levaquin [levofloxacin in d5w]; Macrodantin [nitrofurantoin macrocrystal]; Other; Tape; and Sulfa antibiotics   Review of Systems Review of Systems  Unable to perform ROS: Dementia     Physical Exam Updated Vital Signs BP (!) 161/73 (BP Location: Left Arm)   Pulse 70   Temp 97.7 F (36.5 C) (Oral)   Resp 14   SpO2 98%   Physical Exam  Constitutional: She appears  well-developed and well-nourished. No distress.  Pleasantly confused. Able to answer questions in complete sentences.  HENT:  Head: Normocephalic and atraumatic.  Nose: Nose normal.  Eyes: Pupils are equal, round, and reactive to light. Conjunctivae and EOM are normal. Right eye exhibits no discharge. Left eye exhibits no discharge. No scleral icterus.  Neck: Normal range of motion. Neck supple.  Cardiovascular: Normal rate, regular rhythm, normal heart sounds and intact distal pulses.  Exam reveals no gallop and no friction rub.   No murmur heard. Pulmonary/Chest: Effort normal and breath sounds normal. No respiratory distress.  Abdominal: Soft. Bowel sounds are normal. She exhibits no distension. There is no  tenderness. There is no guarding.  Musculoskeletal: Normal range of motion. She exhibits no edema.  Bilateral lower extremity edema, appears chronic, symmetrical.  Neurological: She is alert. No sensory deficit.  Pupils reactive. No facial asymmetry noted. Cranial nerves appear grossly intact. Sensation intact to light touch on face, BUE and BLE. Strength 5/5 in BUE and BLE. Normal finger to nose coordination.  Skin: Skin is warm and dry. No rash noted.  Psychiatric: She has a normal mood and affect.  Nursing note and vitals reviewed.    ED Treatments / Results  Labs (all labs ordered are listed, but only abnormal results are displayed) Labs Reviewed  BASIC METABOLIC PANEL  CBC  I-STAT TROPONIN, ED    EKG  EKG Interpretation None       Radiology Dg Chest 2 View  Result Date: 12/08/2016 CLINICAL DATA:  Chest pain EXAM: CHEST  2 VIEW COMPARISON:  November 15, 2016 FINDINGS: There is no edema or consolidation. Heart is upper normal in size with pulmonary vascularity within normal limits. There is a moderate hiatal hernia. There is aortic atherosclerosis. No evident adenopathy. There are surgical clips the left breast region. There is degenerative change in the thoracic spine.  IMPRESSION: No edema or consolidation. Moderate hiatal hernia. Aortic atherosclerosis. Aortic Atherosclerosis (ICD10-I70.0). Electronically Signed   By: Lowella Grip III M.D.   On: 12/08/2016 22:37    Procedures Procedures (including critical care time)  Medications Ordered in ED Medications - No data to display   Initial Impression / Assessment and Plan / ED Course  I have reviewed the triage vital signs and the nursing notes.  Pertinent labs & imaging results that were available during my care of the patient were reviewed by me and considered in my medical decision making (see chart for details).     Patient presents to ED for evaluation of chest pain and headache. Patient is a history of A. Fib and SVT.she has had multiple ED visits for similar chest pain and SVT which has spontaneously resolved to normal sinus rhythm on arrival. However, she states that this headache has been different for her. Per her daughter, patient appears similar to baseline and does not appear altered or confused. States that the headache is located on the right side of her head. On physical exam patient has no focal deficits and cranial nerves appear grossly intact. She is afebrile with no history of fever. She denies any chest pain or headache at this time. Heart rate normal. Troponin negative 1. CBC unremarkable. BMP with mild elevation in BUN to 23. EKG similar to prior tracings. Sedimentation rate obtained to evaluate for temporal arteritis, which was negative. We'll obtain delta troponin after 3 hours as well. Patient given headache cocktail with fluids, Reglan and Benadryl. Care signed out to oncoming provider, Melina Schools, PA-C pending recheck, delta troponin.  Patient discussed with and seen by Dr. Roxanne Mins. Please see his note for further detail.  Final Clinical Impressions(s) / ED Diagnoses   Final diagnoses:  None    New Prescriptions New Prescriptions   No medications on file     Delia Heady, Hershal Coria 24/82/50 0370    Delora Fuel, MD 48/88/91 [phone]

## 2016-12-09 DIAGNOSIS — R079 Chest pain, unspecified: Secondary | ICD-10-CM | POA: Diagnosis not present

## 2016-12-09 LAB — SEDIMENTATION RATE: Sed Rate: 3 mm/hr (ref 0–22)

## 2016-12-09 LAB — BASIC METABOLIC PANEL
Anion gap: 8 (ref 5–15)
BUN: 23 mg/dL — AB (ref 6–20)
CHLORIDE: 105 mmol/L (ref 101–111)
CO2: 28 mmol/L (ref 22–32)
Calcium: 8.9 mg/dL (ref 8.9–10.3)
Creatinine, Ser: 0.85 mg/dL (ref 0.44–1.00)
GFR calc Af Amer: >60 mL/min (ref >60)
GFR calc non Af Amer: 60 mL/min — ABNORMAL LOW (ref >60)
Glucose, Bld: 108 mg/dL — ABNORMAL HIGH (ref 65–99)
POTASSIUM: 3.6 mmol/L (ref 3.5–5.1)
Sodium: 141 mmol/L (ref 135–145)

## 2016-12-09 LAB — I-STAT TROPONIN, ED: Troponin i, poc: 0.03 ng/mL (ref 0.00–0.08)

## 2016-12-09 NOTE — Discharge Instructions (Signed)
Your blood work and imaging has been reassuring. Make sure you follow up with your pcp. Unsure of the etiology of your chest pain and headache. No signs of heart attack today, it is important to have close follow up.

## 2016-12-09 NOTE — ED Provider Notes (Signed)
Care assumed from previous provider PA [place]. Please see their note for further details to include full history and physical. To summarize in short pt is a 83 year old Caucasian female past medical history significant for A. fib, SVT, CHF, hypertension, dementia that presents to the ED for evaluation of headache and chest pain.. Case discussed, plan agreed upon. At time of care hand off and was awaiting repeat delta troponin and improvement in patient's headache. Patient was given a migraine cocktail. Repeat delta troponin was negative. Patient states her symptoms have completely resolved. Patient seen by attending Dr. Roxanne Mins who was agreeable to discharge pending repeat second troponin.  Pt is hemodynamically stable, in NAD, & able to ambulate in the ED. Evaluation does not show pathology that would require ongoing emergent intervention or inpatient treatment. I explained the diagnosis to the patient. Pain has been managed & has no complaints prior to dc. Pt is comfortable with above plan and is stable for discharge at this time. All questions were answered prior to disposition. Strict return precautions for f/u to the ED were discussed. Encouraged follow up with PCP.        Doristine Devoid, PA-C 17/49/44 [id number]    Delora Fuel, MD 91/63/84 [phone]

## 2017-01-04 ENCOUNTER — Emergency Department (HOSPITAL_COMMUNITY)
Admission: EM | Admit: 2017-01-04 | Discharge: 2017-01-04 | Disposition: A | Discharge status: Home / Self Care | Payer: Medicare Other | Attending: Emergency Medicine | Admitting: Emergency Medicine

## 2017-01-04 ENCOUNTER — Encounter (HOSPITAL_COMMUNITY): Payer: Self-pay | Admitting: Emergency Medicine

## 2017-01-04 ENCOUNTER — Emergency Department (HOSPITAL_COMMUNITY): Payer: Medicare Other

## 2017-01-04 DIAGNOSIS — I5032 Chronic diastolic (congestive) heart failure: Secondary | ICD-10-CM | POA: Diagnosis not present

## 2017-01-04 DIAGNOSIS — R002 Palpitations: Secondary | ICD-10-CM | POA: Insufficient documentation

## 2017-01-04 DIAGNOSIS — R079 Chest pain, unspecified: Secondary | ICD-10-CM | POA: Insufficient documentation

## 2017-01-04 DIAGNOSIS — I471 Supraventricular tachycardia: Secondary | ICD-10-CM | POA: Diagnosis not present

## 2017-01-04 DIAGNOSIS — I10 Essential (primary) hypertension: Secondary | ICD-10-CM | POA: Insufficient documentation

## 2017-01-04 DIAGNOSIS — F0390 Unspecified dementia without behavioral disturbance: Secondary | ICD-10-CM | POA: Diagnosis not present

## 2017-01-04 DIAGNOSIS — Z87891 Personal history of nicotine dependence: Secondary | ICD-10-CM | POA: Diagnosis not present

## 2017-01-04 DIAGNOSIS — Z853 Personal history of malignant neoplasm of breast: Secondary | ICD-10-CM | POA: Diagnosis not present

## 2017-01-04 DIAGNOSIS — I110 Hypertensive heart disease with heart failure: Secondary | ICD-10-CM | POA: Diagnosis not present

## 2017-01-04 LAB — URINALYSIS, ROUTINE W REFLEX MICROSCOPIC
Bilirubin Urine: NEGATIVE
Glucose, UA: NEGATIVE mg/dL
Hgb urine dipstick: NEGATIVE
Ketones, ur: NEGATIVE mg/dL
Leukocytes, UA: NEGATIVE
Nitrite: NEGATIVE
Protein, ur: NEGATIVE mg/dL
Specific Gravity, Urine: 1.009 (ref 1.005–1.030)
pH: 6 (ref 5.0–8.0)

## 2017-01-04 LAB — CBC
HEMATOCRIT: 42.4 % (ref 36.0–46.0)
HEMOGLOBIN: 13.8 g/dL (ref 12.0–15.0)
MCH: 28.8 pg (ref 26.0–34.0)
MCHC: 32.5 g/dL (ref 30.0–36.0)
MCV: 88.5 fL (ref 78.0–100.0)
Platelets: 144 10*3/uL — ABNORMAL LOW (ref 150–400)
RBC: 4.79 MIL/uL (ref 3.87–5.11)
RDW: 13.3 % (ref 11.5–15.5)
WBC: 5.9 10*3/uL (ref 4.0–10.5)

## 2017-01-04 LAB — I-STAT TROPONIN, ED: Troponin i, poc: 0.04 ng/mL (ref 0.00–0.08)

## 2017-01-04 LAB — BASIC METABOLIC PANEL WITH GFR
Anion gap: 9 (ref 5–15)
BUN: 24 mg/dL — ABNORMAL HIGH (ref 6–20)
CO2: 27 mmol/L (ref 22–32)
Calcium: 8.7 mg/dL — ABNORMAL LOW (ref 8.9–10.3)
Chloride: 103 mmol/L (ref 101–111)
Creatinine, Ser: 0.99 mg/dL (ref 0.44–1.00)
GFR calc Af Amer: 58 mL/min — ABNORMAL LOW
GFR calc non Af Amer: 50 mL/min — ABNORMAL LOW
Glucose, Bld: 199 mg/dL — ABNORMAL HIGH (ref 65–99)
Potassium: 3.4 mmol/L — ABNORMAL LOW (ref 3.5–5.1)
Sodium: 139 mmol/L (ref 135–145)

## 2017-01-04 NOTE — ED Triage Notes (Addendum)
Per EMS, pt from [facility] memory care unit. Staff reports pt was c/o cp. When fire arrived on scene, pt HR was 176, HR converted to NSR 80bpm by the time EMS arrived. Pt has a frequent hx of episodes like this. In triage pt reports "my heart feels like it stopped beating like it was."Pt received 324 ASA by facility staff. Pt has a hx of tachycardia, Afib and Aflutter. Hx of dementia, baseline. A&Ox3. Ambulates with walker.   EMS VS BP 134/82, HR 82, R 16, 97% RA and CBG 235.

## 2017-01-04 NOTE — ED Provider Notes (Signed)
Emergency Department Provider Note   I have reviewed the triage vital signs and the nursing notes.   HISTORY  Chief Complaint Chest Pain   HPI Alexis Winters is a 83 y.o. female past medical history of SVT who presents with an exacerbation of the same. Daughter is with her who provides the history is the patient is demented and does not remember. Daughter states that she often has a CTA chest pain afterwards. Usually once the SVT was way she has improved symptoms. This one happened today. EMS was called to the facility and she was still SVT with rates in the 170s and with some chest pains they were going to bring her here however SVT reported prior to getting on the ambulance and her symptoms improved at that time as well. At this time she is symptom free. She likely source of breath, chest pain, abdominal pain, nausea or vomiting. Daughter states the patient is at her baseline.  LEVEL V CAVEAT Secondary to Dementia  Past Medical History:  Diagnosis Date  . Allergic rhinitis   . Arrhythmia    a. office to call pt on 09/15/13 for 30 day event monitor  . Atrial fibrillation ([place])    a. h/o PAF, in NSR during admission 09/11/13  . Breast cancer ([place])   . Chronic diastolic CHF (congestive heart failure) ([place])    a. echo 09/12/13 EF 76-19%, grade 1 diastolic dysf, mild LVOT obstr, moderate MR, elevated filling pressure  . Dementia   . Dyslipidemia   . Enlarged liver   . Frequent UTI   . GERD (gastroesophageal reflux disease)   . Hard of hearing   . Hemorrhoids   . Hypertension   . Hypoglycemia   . Mitral regurgitation    a. moderate MR by echo 09/12/2013  . OA (osteoarthritis)    hip and back  . Orthostatic hypotension   . Phlebitis    l leg  . Prediabetes     Patient Active Problem List   Diagnosis Date Noted  . TIA (transient ischemic attack) 11/15/2016  . GERD (gastroesophageal reflux disease) 11/15/2016  . Atrial fibrillation ([place])   . SVT (supraventricular  tachycardia) ([place]) 08/18/2016  . Dementia without behavioral disturbance 08/18/2016  . UTI (urinary tract infection) 05/29/2016  . Hypokalemia 04/28/2016  . Rash and nonspecific skin eruption 04/28/2016  . Bradycardia 04/27/2016  . Atrial flutter, paroxysmal ([place]) 06/22/2015  . Near syncope 01/10/2015  . Neck pain on left side 01/10/2015  . Pulmonary nodule, right 01/10/2015  . Leukocytosis 01/10/2015  . Hypocalcemia 01/10/2015  . Dyspnea 01/10/2015  . Chest pain 09/11/2013  . Bilateral neck pain 09/11/2013  . Angina at rest [facility]) 09/11/2013  . Leg edema 09/05/2013  . Failure to thrive 09/05/2013  . Paroxysmal atrial fibrillation ([place]) 09/05/2013  . Chronic diastolic CHF (congestive heart failure) ([place]) 09/05/2013    Past Surgical History:  Procedure Laterality Date  . ABDOMINAL HYSTERECTOMY    . BLADDER SUSPENSION    . BREAST LUMPECTOMY    . CARDIAC CATHETERIZATION  1980's   [facility]  . FOOT SURGERY     left foot  . INNER EAR SURGERY      Current Outpatient Rx  . Order #: [id number] Class: Historical Med  . Order #: [id number] Class: Historical Med  . Order #: [id number] Class: Print  . Order #: [id number] Class: Print  . Order #: [id number] Class: Historical Med  . Order #: [id number] Class: Historical Med  . Order #: [id number] Class: Historical Med  . Order #:  [id number] Class: Historical Med  . Order #: [id number] Class: Print  . Order #: [id number] Class: Historical Med  . Order #: [id number] Class: Historical Med  . Order #: [id number] Class: Historical Med  . Order #: [id number] Class: Historical Med  . Order #: [id number] Class: Historical Med  . Order #: [id number] Class: Historical Med  . Order #: [id number] Class: Print  . Order #: [id number] Class: Historical Med    Allergies Contrast media [iodinated diagnostic agents]; Metoprolol; Pyridium [phenazopyridine hcl]; Levaquin [levofloxacin in d5w]; Macrodantin [nitrofurantoin macrocrystal]; Other; Tape; and Sulfa antibiotics  Family History   Problem Relation Age of Onset  . Cerebral palsy Sister   . Alzheimer's disease Brother   . Hypertension Father   . Heart disease Brother     Social History Social History  Substance Use Topics  . Smoking status: Former Research scientist (life sciences)  . Smokeless tobacco: Never Used  . Alcohol use No    Review of Systems  LEVEL V CAVEAT Secondary to Dementia ____________________________________________   PHYSICAL EXAM:  VITAL SIGNS: ED Triage Vitals  Enc Vitals Group     BP 01/04/17 2043 135/64     Pulse Rate 01/04/17 2043 77     Resp 01/04/17 2043 18     Temp 01/04/17 2043 97.8 F (36.6 C)     Temp Source 01/04/17 2043 Oral     SpO2 01/04/17 2035 97 %     Weight 01/04/17 2041 127 lb (57.6 kg)     Height 01/04/17 2041 5\' 2"  (1.575 m)    Constitutional: Alert and oriented. Well appearing and in no acute distress. Eyes: Conjunctivae are normal. PERRL. EOMI. Head: Atraumatic. Nose: No congestion/rhinnorhea. Mouth/Throat: Mucous membranes are moist.  Oropharynx non-erythematous. Neck: No stridor.  No meningeal signs.   Cardiovascular: Normal rate, regular rhythm. Good peripheral circulation. Grossly normal heart sounds.   Respiratory: Normal respiratory effort.  No retractions. Lungs CTAB. Gastrointestinal: Soft and nontender. No distention.  Musculoskeletal: No lower extremity tenderness nor edema. No gross deformities of extremities. Neurologic:  Normal speech and language. No gross focal neurologic deficits are appreciated.  Skin:  Skin is warm, dry and intact. No rash noted.   ____________________________________________   LABS (all labs ordered are listed, but only abnormal results are displayed)  Labs Reviewed  BASIC METABOLIC PANEL - Abnormal; Notable for the following:       Result Value   Potassium 3.4 (*)    Glucose, Bld 199 (*)    BUN 24 (*)    Calcium 8.7 (*)    GFR calc non Af Amer 50 (*)    GFR calc Af Amer 58 (*)    All other components within normal limits  CBC  - Abnormal; Notable for the following:    Platelets 144 (*)    All other components within normal limits  URINALYSIS, ROUTINE W REFLEX MICROSCOPIC - Abnormal; Notable for the following:    Color, Urine STRAW (*)    All other components within normal limits  I-STAT TROPONIN, ED   ____________________________________________  EKG   EKG Interpretation  Date/Time:  Thursday January 04 2017 20:43:17 EDT Ventricular Rate:  79 PR Interval:    QRS Duration: 93 QT Interval:  389 QTC Calculation: 446 R Axis:   26 Text Interpretation:  Sinus rhythm Borderline prolonged PR interval Probable left atrial enlargement Abnormal R-wave progression, early transition No significant change since last tracing Confirmed by Merrily Pew [phone]) on 01/05/2017 12:09:32 AM       ____________________________________________  RADIOLOGY  Dg Chest 2 View  Result  Date: 01/04/2017 CLINICAL DATA:  Acute onset of generalized chest pain. Initial encounter. EXAM: CHEST  2 VIEW COMPARISON:  Chest radiograph performed 12/08/2016 FINDINGS: The lungs are well-aerated and clear. There is no evidence of focal opacification, pleural effusion or pneumothorax. The heart is borderline enlarged. No acute osseous abnormalities are seen. Clips are noted overlying the left breast. A moderate hiatal hernia is noted. IMPRESSION: 1. Borderline cardiomegaly.  Lungs remain grossly clear. 2. Moderate hiatal hernia noted. Electronically Signed   By: Garald Balding M.D.   On: 01/04/2017 21:56    ____________________________________________   PROCEDURES  Procedure(s) performed:   Procedures   ____________________________________________   INITIAL IMPRESSION / ASSESSMENT AND PLAN / ED COURSE  Pertinent labs & imaging results that were available during my care of the patient were reviewed by me and considered in my medical decision making (see chart for details).  Asymptomatic at this time. Workup clear. Likely related to  the SVT at the time. Stable for dc at this time.  ____________________________________________  FINAL CLINICAL IMPRESSION(S) / ED DIAGNOSES  Final diagnoses:  Chest pain, unspecified type  SVT (supraventricular tachycardia) (HCC)  Palpitations     MEDICATIONS GIVEN DURING THIS VISIT:  Medications - No data to display   NEW OUTPATIENT MEDICATIONS STARTED DURING THIS VISIT:  Discharge Medication List as of 01/04/2017 10:56 PM      Note:  This document was prepared using Dragon voice recognition software and may include unintentional dictation errors.   Maleeya Peterkin, Corene Cornea, MD 01/05/17 [phone]

## 2017-01-04 NOTE — ED Notes (Signed)
Patient transported to X-ray 

## 2017-01-21 ENCOUNTER — Emergency Department (HOSPITAL_COMMUNITY): Payer: Medicare Other

## 2017-01-21 ENCOUNTER — Encounter (HOSPITAL_COMMUNITY): Payer: Self-pay | Admitting: Emergency Medicine

## 2017-01-21 ENCOUNTER — Emergency Department (HOSPITAL_COMMUNITY)
Admission: EM | Admit: 2017-01-21 | Discharge: 2017-01-21 | Disposition: A | Discharge status: Home / Self Care | Payer: Medicare Other | Attending: Emergency Medicine | Admitting: Emergency Medicine

## 2017-01-21 DIAGNOSIS — I110 Hypertensive heart disease with heart failure: Secondary | ICD-10-CM | POA: Insufficient documentation

## 2017-01-21 DIAGNOSIS — Z87891 Personal history of nicotine dependence: Secondary | ICD-10-CM | POA: Insufficient documentation

## 2017-01-21 DIAGNOSIS — R000 Tachycardia, unspecified: Secondary | ICD-10-CM | POA: Diagnosis not present

## 2017-01-21 DIAGNOSIS — I5032 Chronic diastolic (congestive) heart failure: Secondary | ICD-10-CM | POA: Insufficient documentation

## 2017-01-21 DIAGNOSIS — Z853 Personal history of malignant neoplasm of breast: Secondary | ICD-10-CM | POA: Diagnosis not present

## 2017-01-21 DIAGNOSIS — Z79899 Other long term (current) drug therapy: Secondary | ICD-10-CM | POA: Diagnosis not present

## 2017-01-21 DIAGNOSIS — F0390 Unspecified dementia without behavioral disturbance: Secondary | ICD-10-CM | POA: Diagnosis not present

## 2017-01-21 LAB — BASIC METABOLIC PANEL
ANION GAP: 9 (ref 5–15)
BUN: 25 mg/dL — ABNORMAL HIGH (ref 6–20)
CALCIUM: 8.4 mg/dL — AB (ref 8.9–10.3)
CO2: 25 mmol/L (ref 22–32)
CREATININE: 0.85 mg/dL (ref 0.44–1.00)
Chloride: 106 mmol/L (ref 101–111)
GFR, EST NON AFRICAN AMERICAN: 60 mL/min — AB (ref >60)
Glucose, Bld: 109 mg/dL — ABNORMAL HIGH (ref 65–99)
Potassium: 3.6 mmol/L (ref 3.5–5.1)
SODIUM: 140 mmol/L (ref 135–145)

## 2017-01-21 LAB — CBC
HCT: 41.4 % (ref 36.0–46.0)
Hemoglobin: 13.7 g/dL (ref 12.0–15.0)
MCH: 29.3 pg (ref 26.0–34.0)
MCHC: 33.1 g/dL (ref 30.0–36.0)
MCV: 88.7 fL (ref 78.0–100.0)
PLATELETS: 136 10*3/uL — AB (ref 150–400)
RBC: 4.67 MIL/uL (ref 3.87–5.11)
RDW: 13.3 % (ref 11.5–15.5)
WBC: 7.5 10*3/uL (ref 4.0–10.5)

## 2017-01-21 NOTE — ED Notes (Signed)
Patient transported to X-ray 

## 2017-01-21 NOTE — ED Notes (Signed)
Delay in lab draw,  Pt headed to xray.

## 2017-01-21 NOTE — ED Notes (Signed)
Pt and daughter understood dc material. NAD noted. Daughter took the DNR and facility paperwork with her to return personally.

## 2017-01-21 NOTE — ED Triage Notes (Signed)
Pt BIB [facility] for fast heart rate. Pt coming from [place]. Per report patients rate was 160s. Pt converted prior to EMS being able to get her on a monitor. Pt has similar history and has converted on her own previously. Pt 80s Afib on arrival.

## 2017-01-21 NOTE — ED Notes (Signed)
RN went to notify providers that patient had been in the ED for close to 2 hours and has not seen provider. Providers acknowledged

## 2017-01-21 NOTE — ED Provider Notes (Signed)
[facility] EMERGENCY DEPARTMENT Provider Note   CSN: [id number] Arrival date & time: 01/21/17  2012     History   Chief Complaint Chief Complaint  Patient presents with  . Tachycardia  Level 5 caveat dementia history obtained from patient and from patient's daughter who accompanies her EMS  HPI Alexis Winters is a 83 y.o. female.  HPI Patient complained of heart racing earlier today with a vague discomfort in her chest.  EMS was called.  EMS noted her heart rate to be in the 160s.  Her heart rate converted to normal rate spontaneously while in route before EMS could obtain a rhythm strip or EKG.  Patient is presently asymptomatic she has had similar episodes in the past with atrial fibrillation and with SVT.  No treatment prior to coming here Past Medical History:  Diagnosis Date  . Allergic rhinitis   . Arrhythmia    a. office to call pt on 09/15/13 for 30 day event monitor  . Atrial fibrillation ([place])    a. h/o PAF, in NSR during admission 09/11/13  . Breast cancer ([place])   . Chronic diastolic CHF (congestive heart failure) ([place])    a. echo 09/12/13 EF 33-29%, grade 1 diastolic dysf, mild LVOT obstr, moderate MR, elevated filling pressure  . Dementia   . Dyslipidemia   . Enlarged liver   . Frequent UTI   . GERD (gastroesophageal reflux disease)   . Hard of hearing   . Hemorrhoids   . Hypertension   . Hypoglycemia   . Mitral regurgitation    a. moderate MR by echo 09/12/2013  . OA (osteoarthritis)    hip and back  . Orthostatic hypotension   . Phlebitis    l leg  . Prediabetes     Patient Active Problem List   Diagnosis Date Noted  . TIA (transient ischemic attack) 11/15/2016  . GERD (gastroesophageal reflux disease) 11/15/2016  . Atrial fibrillation ([place])   . SVT (supraventricular tachycardia) ([place]) 08/18/2016  . Dementia without behavioral disturbance 08/18/2016  . UTI (urinary tract infection) 05/29/2016  . Hypokalemia 04/28/2016  . Rash  and nonspecific skin eruption 04/28/2016  . Bradycardia 04/27/2016  . Atrial flutter, paroxysmal ([place]) 06/22/2015  . Near syncope 01/10/2015  . Neck pain on left side 01/10/2015  . Pulmonary nodule, right 01/10/2015  . Leukocytosis 01/10/2015  . Hypocalcemia 01/10/2015  . Dyspnea 01/10/2015  . Chest pain 09/11/2013  . Bilateral neck pain 09/11/2013  . Angina at rest [facility]) 09/11/2013  . Leg edema 09/05/2013  . Failure to thrive 09/05/2013  . Paroxysmal atrial fibrillation ([place]) 09/05/2013  . Chronic diastolic CHF (congestive heart failure) ([place]) 09/05/2013    Past Surgical History:  Procedure Laterality Date  . ABDOMINAL HYSTERECTOMY    . BLADDER SUSPENSION    . BREAST LUMPECTOMY    . CARDIAC CATHETERIZATION  1980's   [facility]  . FOOT SURGERY     left foot  . INNER EAR SURGERY      OB History    No data available       Home Medications    Prior to Admission medications   Medication Sig Start Date End Date Taking? Authorizing Provider  acetaminophen (TYLENOL) 500 MG tablet Take 500 mg by mouth every 4 (four) hours as needed for mild pain, fever or headache.     [provider]  alum & mag hydroxide-simeth ([place]) 200-200-20 MG/5ML suspension Take 30 mLs by mouth every 6 (six) hours as needed  for indigestion or heartburn.    [provider]  aspirin 325 MG tablet Take 1 tablet (325 mg total) by mouth daily. 11/17/16   Dhungel, Flonnie Overman, MD  atorvastatin (LIPITOR) 40 MG tablet Take 1 tablet (40 mg total) by mouth daily at 6 PM. 11/16/16   Dhungel, Nishant, MD  Cranberry 450 MG TABS Take 450 mg by mouth 2 (two) times daily.     [provider]  furosemide (LASIX) 40 MG tablet Take 40 mg by mouth 2 (two) times daily.  04/09/12   [provider]  guaifenesin (ROBITUSSIN) 100 MG/5ML syrup Take 200 mg by mouth every 6 (six) hours as needed for cough.    [provider]  ibuprofen (ADVIL,MOTRIN) 200 MG tablet Take 200 mg by mouth every 6 (six)  hours as needed (back pain). Reported on 06/22/2015    [provider]  isosorbide mononitrate (IMDUR) 30 MG 24 hr tablet Take 1 tablet (30 mg total) by mouth daily. 10/13/15   Nahser, Wonda Cheng, MD  loperamide (IMODIUM) 2 MG capsule Take 2 mg by mouth daily as needed for diarrhea or loose stools.    [provider]  magnesium hydroxide (MILK OF MAGNESIA) 400 MG/5ML suspension Take 30 mLs by mouth at bedtime as needed for mild constipation.    [provider]  methenamine (MANDELAMINE) 1 g tablet Take 1,000 mg by mouth 2 (two) times daily.    [provider]  mirabegron ER (MYRBETRIQ) 25 MG TB24 tablet Take 25 mg by mouth daily.    [provider]  Neomycin-Bacitracin-Polymyxin (HCA TRIPLE ANTIBIOTIC OINTMENT EX) Apply 1 application topically daily as needed (skin tears/abrasions).     [provider]  pantoprazole (PROTONIX) 40 MG tablet Take 40 mg by mouth daily. 10/19/14   [provider]  potassium chloride (K-DUR) 10 MEQ tablet Take 2 tablets (20 mEq total) by mouth 3 (three) times daily. 06/21/16   Sherwood Gambler, MD  vitamin C (ASCORBIC ACID) 500 MG tablet Take 500 mg by mouth daily.    [provider]    Family History Family History  Problem Relation Age of Onset  . Cerebral palsy Sister   . Alzheimer's disease Brother   . Hypertension Father   . Heart disease Brother     Social History Social History  Substance Use Topics  . Smoking status: Former Research scientist (life sciences)  . Smokeless tobacco: Never Used  . Alcohol use No   Resides in memory care unit.  DNR CODE STATUS  Allergies   Contrast media [iodinated diagnostic agents]; Metoprolol; Pyridium [phenazopyridine hcl]; Levaquin [levofloxacin in d5w]; Macrodantin [nitrofurantoin macrocrystal]; Other; Tape; and Sulfa antibiotics   Review of Systems Review of Systems  Unable to perform ROS: Dementia  Cardiovascular: Positive for chest pain, palpitations and leg swelling.         Chronic bilateral leg edema     Physical Exam Updated Vital Signs BP (!) 153/68   Pulse 71   Temp 97.6 F (36.4 C) (Oral)   Resp (!) 21   Ht 5\' 2"  (1.575 m)   Wt 57.6 kg (127 lb)   SpO2 100%   BMI 23.23 kg/m   Physical Exam  Constitutional: She appears well-developed and well-nourished.  HENT:  Head: Normocephalic and atraumatic.  Eyes: Pupils are equal, round, and reactive to light. Conjunctivae are normal.  Neck: Neck supple. No tracheal deviation present. No thyromegaly present.  Cardiovascular: Normal rate and regular rhythm.   No murmur heard. Pulmonary/Chest: Effort normal  and breath sounds normal.  Abdominal: Soft. Bowel sounds are normal. She exhibits no distension. There is no tenderness.  Musculoskeletal: Normal range of motion. She exhibits edema. She exhibits no tenderness.  2+ pretibial pitting edema bilaterally  Neurological: She is alert. Coordination normal.  Skin: Skin is warm and dry. No rash noted.  Psychiatric:  Pleasantly confused confused  Nursing note and vitals reviewed.    ED Treatments / Results  Labs (all labs ordered are listed, but only abnormal results are displayed) Labs Reviewed  BASIC METABOLIC PANEL - Abnormal; Notable for the following:       Result Value   Glucose, Bld 109 (*)    BUN 25 (*)    Calcium 8.4 (*)    GFR calc non Af Amer 60 (*)    All other components within normal limits  CBC - Abnormal; Notable for the following:    Platelets 136 (*)    All other components within normal limits    EKG  EKG Interpretation  Date/Time:  Sunday January 21 2017 20:27:40 EDT Ventricular Rate:  77 PR Interval:    QRS Duration: 90 QT Interval:  392 QTC Calculation: 444 R Axis:   28 Text Interpretation:  Sinus rhythm Prolonged PR interval Abnormal R-wave progression, early transition No significant change since last tracing Confirmed by Orlie Dakin [id number]) on 01/21/2017 10:14:39 PM       Radiology Dg Chest 2  View  Result Date: 01/21/2017 CLINICAL DATA:  Tachycardia EXAM: CHEST  2 VIEW COMPARISON:  01/04/2017 FINDINGS: Surgical clips over the left breast. No acute consolidation or pleural effusion. Stable borderline enlarged cardiomediastinal silhouette with atherosclerosis. No pneumothorax. IMPRESSION: No active cardiopulmonary disease.  Stable borderline cardiomegaly. Electronically Signed   By: Donavan Foil M.D.   On: 01/21/2017 21:17    Procedures Procedures (including critical care time)  Medications Ordered in ED Medications - No data to display Chest x-ray reviewed by me Results for orders placed or performed during the hospital encounter of 99/37/16  Basic metabolic panel  Result Value Ref Range   Sodium 140 135 - 145 mmol/L   Potassium 3.6 3.5 - 5.1 mmol/L   Chloride 106 101 - 111 mmol/L   CO2 25 22 - 32 mmol/L   Glucose, Bld 109 (H) 65 - 99 mg/dL   BUN 25 (H) 6 - 20 mg/dL   Creatinine, Ser 0.85 0.44 - 1.00 mg/dL   Calcium 8.4 (L) 8.9 - 10.3 mg/dL   GFR calc non Af Amer 60 (L) >60 mL/min   GFR calc Af Amer >60 >60 mL/min   Anion gap 9 5 - 15  CBC  Result Value Ref Range   WBC 7.5 4.0 - 10.5 K/uL   RBC 4.67 3.87 - 5.11 MIL/uL   Hemoglobin 13.7 12.0 - 15.0 g/dL   HCT 41.4 36.0 - 46.0 %   MCV 88.7 78.0 - 100.0 fL   MCH 29.3 26.0 - 34.0 pg   MCHC 33.1 30.0 - 36.0 g/dL   RDW 13.3 11.5 - 15.5 %   Platelets 136 (L) 150 - 400 K/uL   Dg Chest 2 View  Result Date: 01/21/2017 CLINICAL DATA:  Tachycardia EXAM: CHEST  2 VIEW COMPARISON:  01/04/2017 FINDINGS: Surgical clips over the left breast. No acute consolidation or pleural effusion. Stable borderline enlarged cardiomediastinal silhouette with atherosclerosis. No pneumothorax. IMPRESSION: No active cardiopulmonary disease.  Stable borderline cardiomegaly. Electronically Signed   By: Donavan Foil M.D.   On: 01/21/2017 21:17  Dg Chest 2 View  Result Date: 01/04/2017 CLINICAL DATA:  Acute onset of generalized chest pain.  Initial encounter. EXAM: CHEST  2 VIEW COMPARISON:  Chest radiograph performed 12/08/2016 FINDINGS: The lungs are well-aerated and clear. There is no evidence of focal opacification, pleural effusion or pneumothorax. The heart is borderline enlarged. No acute osseous abnormalities are seen. Clips are noted overlying the left breast. A moderate hiatal hernia is noted. IMPRESSION: 1. Borderline cardiomegaly.  Lungs remain grossly clear. 2. Moderate hiatal hernia noted. Electronically Signed   By: Garald Balding M.D.   On: 01/04/2017 21:56   Initial Impression / Assessment and Plan / ED Course  I have reviewed the triage vital signs and the nursing notes.  Pertinent labs & imaging results that were available during my care of the patient were reviewed by me and considered in my medical decision making (see chart for details).     Suspect the patient had SVT no further treatment needed.  Plan follow-up with Dr. Acie Fredrickson as out pt   Final Clinical Impressions(s) / ED Diagnoses   Final diagnoses:  Tachycardia    New Prescriptions New Prescriptions   No medications on file     Orlie Dakin, MD 01/21/17 2233

## 2017-01-21 NOTE — Discharge Instructions (Signed)
Contact Dr.Nahser's office tomorrow to let him know that you were here for tachycardia.  He may want to see his [place] in the office

## 2017-01-21 NOTE — ED Notes (Signed)
Patient transported to x-ray. ?

## 2017-01-21 NOTE — ED Notes (Signed)
Pt using an external female catheter (Purewick).

## 2017-01-21 NOTE — ED Notes (Signed)
Called Kathlee Nations at [facility] and notified her of her discharge and return with her daughter

## 2017-01-21 NOTE — ED Notes (Signed)
Pt's left arm is restricted. Restriction band placed on pt's left wrist.

## 2017-01-25 ENCOUNTER — Emergency Department (HOSPITAL_COMMUNITY)
Admission: EM | Admit: 2017-01-25 | Discharge: 2017-01-25 | Disposition: A | Discharge status: Home / Self Care | Payer: Medicare Other | Attending: Emergency Medicine | Admitting: Emergency Medicine

## 2017-01-25 ENCOUNTER — Encounter (HOSPITAL_COMMUNITY): Payer: Self-pay | Admitting: Emergency Medicine

## 2017-01-25 DIAGNOSIS — R600 Localized edema: Secondary | ICD-10-CM | POA: Insufficient documentation

## 2017-01-25 DIAGNOSIS — R000 Tachycardia, unspecified: Secondary | ICD-10-CM | POA: Insufficient documentation

## 2017-01-25 DIAGNOSIS — Z853 Personal history of malignant neoplasm of breast: Secondary | ICD-10-CM | POA: Insufficient documentation

## 2017-01-25 DIAGNOSIS — Z87891 Personal history of nicotine dependence: Secondary | ICD-10-CM | POA: Diagnosis not present

## 2017-01-25 DIAGNOSIS — Z8679 Personal history of other diseases of the circulatory system: Secondary | ICD-10-CM | POA: Insufficient documentation

## 2017-01-25 DIAGNOSIS — I110 Hypertensive heart disease with heart failure: Secondary | ICD-10-CM | POA: Diagnosis not present

## 2017-01-25 DIAGNOSIS — Z8673 Personal history of transient ischemic attack (TIA), and cerebral infarction without residual deficits: Secondary | ICD-10-CM | POA: Insufficient documentation

## 2017-01-25 DIAGNOSIS — I5032 Chronic diastolic (congestive) heart failure: Secondary | ICD-10-CM | POA: Insufficient documentation

## 2017-01-25 DIAGNOSIS — R072 Precordial pain: Secondary | ICD-10-CM | POA: Diagnosis not present

## 2017-01-25 DIAGNOSIS — F0390 Unspecified dementia without behavioral disturbance: Secondary | ICD-10-CM | POA: Insufficient documentation

## 2017-01-25 DIAGNOSIS — Z7982 Long term (current) use of aspirin: Secondary | ICD-10-CM | POA: Insufficient documentation

## 2017-01-25 LAB — I-STAT CHEM 8, ED
BUN: 25 mg/dL — AB (ref 6–20)
CALCIUM ION: 1.04 mmol/L — AB (ref 1.15–1.40)
Chloride: 105 mmol/L (ref 101–111)
Creatinine, Ser: 0.8 mg/dL (ref 0.44–1.00)
GLUCOSE: 106 mg/dL — AB (ref 65–99)
HCT: 40 % (ref 36.0–46.0)
Hemoglobin: 13.6 g/dL (ref 12.0–15.0)
Potassium: 3.7 mmol/L (ref 3.5–5.1)
SODIUM: 143 mmol/L (ref 135–145)
TCO2: 27 mmol/L (ref 22–32)

## 2017-01-25 LAB — I-STAT TROPONIN, ED: TROPONIN I, POC: 0.02 ng/mL (ref 0.00–0.08)

## 2017-01-25 NOTE — ED Triage Notes (Signed)
Patient from [facility]. EMS called for SVT and chest pain 170's. When loaded on stretcher, she converted and then denied chest pain. 325mg  aspirin given. Currently denies pain. DNR.

## 2017-01-25 NOTE — Discharge Instructions (Signed)
Please read and follow all provided instructions.  Your diagnoses today include:  1. Precordial pain     Tests performed today include:  An EKG of your heart  Cardiac enzymes - a blood test for heart muscle damage  Electrolytes  Vital signs. See below for your results today.   Medications prescribed:   None  Take any prescribed medications only as directed.  Follow-up instructions: Please follow-up with your cardiologist.   Return instructions:  SEEK IMMEDIATE MEDICAL ATTENTION IF:  You have severe chest pain, especially if the pain is crushing or pressure-like and spreads to the arms, back, neck, or jaw, or if you have sweating, nausea (feeling sick to your stomach), or shortness of breath. THIS IS AN EMERGENCY. Don't wait to see if the pain will go away. Get medical help at once. Call 911 or 0 (operator). DO NOT drive yourself to the hospital.   Your chest pain gets worse and does not go away with rest.   You have an attack of chest pain lasting longer than usual, despite rest and treatment with the medications your caregiver has prescribed.   You wake from sleep with chest pain or shortness of breath.  You feel dizzy or faint.  You have chest pain not typical of your usual pain for which you originally saw your caregiver.   You have any other emergent concerns regarding your health.  Your vital signs today were: BP (!) 119/57    Pulse 65    Temp 97.8 F (36.6 C) (Oral)    Resp 15    SpO2 95%  If your blood pressure (BP) was elevated above 135/85 this visit, please have this repeated by your doctor within one month. --------------

## 2017-01-25 NOTE — ED Provider Notes (Signed)
[facility] EMERGENCY DEPARTMENT Provider Note   CSN: [id number] Arrival date & time: 01/25/17  1300     History   Chief Complaint Chief Complaint  Patient presents with  . Irregular Heart Beat    HPI Alexis Winters is a 83 y.o. female.  Patient sent from nursing facility today with complaint of chest pain and tachycardia.  Patient has a known history of SVT.  Vagal maneuvers with ice water performed. When symptoms did not improve, EMS was called. Apparently when she was loaded onto the stretcher, she converted spontaneously and her pain stopped.  She was transported to the hospital for further evaluation.  Patient has had 2 similar presentations in the past 30 days.  Level 5 caveat due to dementia.      Past Medical History:  Diagnosis Date  . Allergic rhinitis   . Arrhythmia    a. office to call pt on 09/15/13 for 30 day event monitor  . Atrial fibrillation ([place])    a. h/o PAF, in NSR during admission 09/11/13  . Breast cancer ([place])   . Chronic diastolic CHF (congestive heart failure) ([place])    a. echo 09/12/13 EF 83-38%, grade 1 diastolic dysf, mild LVOT obstr, moderate MR, elevated filling pressure  . Dementia   . Dyslipidemia   . Enlarged liver   . Frequent UTI   . GERD (gastroesophageal reflux disease)   . Hard of hearing   . Hemorrhoids   . Hypertension   . Hypoglycemia   . Mitral regurgitation    a. moderate MR by echo 09/12/2013  . OA (osteoarthritis)    hip and back  . Orthostatic hypotension   . Phlebitis    l leg  . Prediabetes     Patient Active Problem List   Diagnosis Date Noted  . TIA (transient ischemic attack) 11/15/2016  . GERD (gastroesophageal reflux disease) 11/15/2016  . Atrial fibrillation ([place])   . SVT (supraventricular tachycardia) ([place]) 08/18/2016  . Dementia without behavioral disturbance 08/18/2016  . UTI (urinary tract infection) 05/29/2016  . Hypokalemia 04/28/2016  . Rash and nonspecific skin eruption  04/28/2016  . Bradycardia 04/27/2016  . Atrial flutter, paroxysmal ([place]) 06/22/2015  . Near syncope 01/10/2015  . Neck pain on left side 01/10/2015  . Pulmonary nodule, right 01/10/2015  . Leukocytosis 01/10/2015  . Hypocalcemia 01/10/2015  . Dyspnea 01/10/2015  . Chest pain 09/11/2013  . Bilateral neck pain 09/11/2013  . Angina at rest [facility]) 09/11/2013  . Leg edema 09/05/2013  . Failure to thrive 09/05/2013  . Paroxysmal atrial fibrillation ([place]) 09/05/2013  . Chronic diastolic CHF (congestive heart failure) ([place]) 09/05/2013    Past Surgical History:  Procedure Laterality Date  . ABDOMINAL HYSTERECTOMY    . BLADDER SUSPENSION    . BREAST LUMPECTOMY    . CARDIAC CATHETERIZATION  1980's   [facility]  . FOOT SURGERY     left foot  . INNER EAR SURGERY      OB History    No data available       Home Medications    Prior to Admission medications   Medication Sig Start Date End Date Taking? Authorizing Provider  acetaminophen (TYLENOL) 500 MG tablet Take 500 mg by mouth every 4 (four) hours as needed for mild pain, fever or headache.     [provider]  alum & mag hydroxide-simeth ([place]) 200-200-20 MG/5ML suspension Take 30 mLs by mouth every 6 (six) hours as needed for indigestion or heartburn.  [provider]  aspirin 325 MG tablet Take 1 tablet (325 mg total) by mouth daily. 11/17/16   Dhungel, Flonnie Overman, MD  atorvastatin (LIPITOR) 40 MG tablet Take 1 tablet (40 mg total) by mouth daily at 6 PM. 11/16/16   Dhungel, Nishant, MD  Cranberry 450 MG TABS Take 450 mg by mouth 2 (two) times daily.     [provider]  furosemide (LASIX) 40 MG tablet Take 40 mg by mouth 2 (two) times daily.  04/09/12   [provider]  guaifenesin (ROBITUSSIN) 100 MG/5ML syrup Take 200 mg by mouth every 6 (six) hours as needed for cough.    [provider]  ibuprofen (ADVIL,MOTRIN) 200 MG tablet Take 200 mg by mouth every 6 (six) hours as needed (back pain).  Reported on 06/22/2015    [provider]  isosorbide mononitrate (IMDUR) 30 MG 24 hr tablet Take 1 tablet (30 mg total) by mouth daily. 10/13/15   Nahser, Wonda Cheng, MD  loperamide (IMODIUM) 2 MG capsule Take 2 mg by mouth daily as needed for diarrhea or loose stools.    [provider]  magnesium hydroxide (MILK OF MAGNESIA) 400 MG/5ML suspension Take 30 mLs by mouth at bedtime as needed for mild constipation.    [provider]  methenamine (MANDELAMINE) 1 g tablet Take 1,000 mg by mouth 2 (two) times daily.    [provider]  mirabegron ER (MYRBETRIQ) 25 MG TB24 tablet Take 25 mg by mouth daily.    [provider]  Neomycin-Bacitracin-Polymyxin (HCA TRIPLE ANTIBIOTIC OINTMENT EX) Apply 1 application topically daily as needed (skin tears/abrasions).     [provider]  pantoprazole (PROTONIX) 40 MG tablet Take 40 mg by mouth daily. 10/19/14   [provider]  potassium chloride (K-DUR) 10 MEQ tablet Take 2 tablets (20 mEq total) by mouth 3 (three) times daily. 06/21/16   Sherwood Gambler, MD  vitamin C (ASCORBIC ACID) 500 MG tablet Take 500 mg by mouth daily.    [provider]    Family History Family History  Problem Relation Age of Onset  . Cerebral palsy Sister   . Alzheimer's disease Brother   . Hypertension Father   . Heart disease Brother     Social History Social History  Substance Use Topics  . Smoking status: Former Research scientist (life sciences)  . Smokeless tobacco: Never Used  . Alcohol use No     Allergies   Contrast media [iodinated diagnostic agents]; Metoprolol; Pyridium [phenazopyridine hcl]; Levaquin [levofloxacin in d5w]; Macrodantin [nitrofurantoin macrocrystal]; Other; Tape; and Sulfa antibiotics   Review of Systems Review of Systems  Unable to perform ROS: Dementia     Physical Exam Updated Vital Signs BP (!) 114/101 (BP Location: Right Arm)   Pulse 90   Temp 97.8 F (36.6 C) (Oral)   Resp 18   SpO2  100%   Physical Exam  Constitutional: She appears well-developed and well-nourished.  HENT:  Head: Normocephalic and atraumatic.  Mouth/Throat: Oropharynx is clear and moist and mucous membranes are normal. Mucous membranes are not dry.  Patient hard of hearing.  Eyes: Conjunctivae are normal.  Neck: Trachea normal and normal range of motion. Neck supple. Normal carotid pulses and no JVD present. No muscular tenderness present. Carotid bruit is not present. No tracheal deviation present.  Cardiovascular: Normal rate, regular rhythm, S1 normal, S2 normal, normal heart sounds and intact distal pulses.  Exam reveals no decreased pulses.   No murmur heard. Pulmonary/Chest: Effort normal. No respiratory  distress. She has no wheezes. She exhibits no tenderness.  Abdominal: Soft. Normal aorta and bowel sounds are normal. There is no tenderness. There is no rebound and no guarding.  Musculoskeletal: Normal range of motion. She exhibits edema.  2+ pitting edema of the bilateral lower extremities extending to the distal thighs.  No associated cellulitis noted no clinical signs of DVT.  Neurological: She is alert.  Skin: Skin is warm and dry. She is not diaphoretic. No cyanosis. No pallor.  Psychiatric: She has a normal mood and affect.  Nursing note and vitals reviewed.    ED Treatments / Results  Labs (all labs ordered are listed, but only abnormal results are displayed) Labs Reviewed  I-STAT CHEM 8, ED - Abnormal; Notable for the following:       Result Value   BUN 25 (*)    Glucose, Bld 106 (*)    Calcium, Ion 1.04 (*)    All other components within normal limits  I-STAT TROPONIN, ED    EKG  EKG Interpretation  Date/Time:  Thursday January 25 2017 13:16:00 EDT Ventricular Rate:  78 PR Interval:    QRS Duration: 95 QT Interval:  382 QTC Calculation: 438 R Axis:   22 Text Interpretation:  Sinus rhythm Probable left atrial enlargement Abnormal R-wave progression, early transition  Baseline wander in lead(s) V2 No significant change since last tracing Confirmed by Merrily Pew [phone]) on 01/25/2017 3:09:32 PM       Radiology No results found.  Procedures Procedures (including critical care time)  Medications Ordered in ED Medications - No data to display   Initial Impression / Assessment and Plan / ED Course  I have reviewed the triage vital signs and the nursing notes.  Pertinent labs & imaging results that were available during my care of the patient were reviewed by me and considered in my medical decision making (see chart for details).     Patient seen and examined.  Symptoms currently seem to be resolved.  Discussed with Dr. Dayna Barker who will see.  Vital signs reviewed and are as follows: BP (!) 114/101 (BP Location: Right Arm)   Pulse 90   Temp 97.8 F (36.6 C) (Oral)   Resp 18   SpO2 100%   3:27 PM stable during ED stay.  Patient and daughter updated on results.  Discussed difficulty of SVT control, given that patient is not a candidate for ablation or pacemaker.  She also cannot tolerate several medications.  Encouraged to return with worsening symptoms as there is no real way to predict whether or not this rhythm will get better or if it is being caused by more serious underlying condition.  Patient and daughter verbalized understanding and agree with plan.   Final Clinical Impressions(s) / ED Diagnoses   Final diagnoses:  Precordial pain   Patient with chest pain in the setting of probable run of SVT, resolving spontaneously.  Troponin and EKG are reassuring here.  Patient has remained stable.  Feel safe for discharge home at this time.  New Prescriptions New Prescriptions   No medications on file     Carlisle Cater, Hershal Coria 01/25/17 1530    Mesner, Corene Cornea, MD 01/25/17 1544

## 2017-01-25 NOTE — ED Notes (Signed)
Pt verbalized understanding discharge instructions and denies any further needs or questions at this time. VS stable, ambulatory and steady gait.   

## 2017-02-09 ENCOUNTER — Ambulatory Visit: Payer: Medicare Other | Admitting: Cardiovascular Disease

## 2017-02-13 ENCOUNTER — Ambulatory Visit (INDEPENDENT_AMBULATORY_CARE_PROVIDER_SITE_OTHER): Payer: Medicare Other | Admitting: Cardiovascular Disease

## 2017-02-13 ENCOUNTER — Encounter (INDEPENDENT_AMBULATORY_CARE_PROVIDER_SITE_OTHER): Payer: Self-pay

## 2017-02-13 ENCOUNTER — Encounter: Payer: Self-pay | Admitting: Cardiovascular Disease

## 2017-02-13 VITALS — BP 138/58 | HR 84 | Ht 62.0 in | Wt 133.0 lb

## 2017-02-13 DIAGNOSIS — I471 Supraventricular tachycardia: Secondary | ICD-10-CM | POA: Diagnosis not present

## 2017-02-13 MED ORDER — CARVEDILOL 3.125 MG PO TABS: 3.1250 mg | ORAL_TABLET | Freq: Two times a day (BID) | ORAL | 3 refills | Status: AC

## 2017-02-13 NOTE — Progress Notes (Signed)
Alexis Winters Date of Birth  12/31/1927       [facility] [address], [address], [place], [place]  [address]   [place],   [address] [place]   Fax  [phone]     Fax [phone]  Problem List: 1. Atrial fibrillation 2. Congestive heart failure 3. Cardiac cath with cardiac arrest  History of Present Illness:  Alexis Winters is a 83 yo with hx of CHF, palpitations ( lasts for a minute or so).  This past Friday, she was seen at her medical doctors office - tachycardia, fatigue, lots of peripheral edema.    She has these spells on occasion.    For the past 2 weeks she has had progressive "spells" with severe dyspnea, profound fatigue,  Some mild , vague chest tightness, tachypalpitations.  She also has some left hand tingling.    October 21, 2013:  Alexis Winters was admitted to the hospital with vague symptoms -- ? CP and increased dyspnea. Echo shows normal LV function.  Moderate MR. She is doing better.  Has home health coming out  Her dementia is gradually worsening.   October 13, 2015:  Alexis Winters  is seen today for follow up of her atrial fib.   Was seen with daughter, Andrey Campanile.  Converted spontanously to NSR   Troponin levels were mildy elevated  She has occasional left sided chest pain  Beneath her left breast.   Lasts 1-2 minutes.    Aug 18, 2016:  Alexis Winters is seen with daughter, Andrey Campanile. Has been having lots of SVT - the last episode converted with Adenosine 6 mg IV .  She has dementia and it is difficult for her to remember to tell her staff at the SNF. [facility], on [address])   We had her on toprol but she had an episode of syncope and bradycardia ( HR of 36) while on toprol. It was held since that time. She has had multiple episodes of SVT .   Nov. 20, 2018:  Has had several trips to the ER for SVT .  HR was 160 . She converted on her own back to NSR .     Had TIA s in Aug. 2018;  ASA was increased up to 325 mg a day     Current Outpatient Medications on File Prior to Visit  Medication Sig Dispense Refill  . acetaminophen (TYLENOL) 500 MG tablet Take 500 mg by mouth every 4 (four) hours as needed for mild pain, fever or headache.     Marland Kitchen alum & mag hydroxide-simeth (MINTOX) 737-106-26 MG/5ML suspension Take 30 mLs by mouth every 6 (six) hours as needed for indigestion or heartburn.    Marland Kitchen aspirin 325 MG tablet Take 1 tablet (325 mg total) by mouth daily. 30 tablet 0  . atorvastatin (LIPITOR) 40 MG tablet Take 1 tablet (40 mg total) by mouth daily at 6 PM. 30 tablet 0  . Cranberry 450 MG TABS Take 450 mg by mouth 2 (two) times daily.     . furosemide (LASIX) 40 MG tablet Take 40 mg by mouth 2 (two) times daily.     Marland Kitchen guaifenesin (ROBITUSSIN) 100 MG/5ML syrup Take 200 mg by mouth every 6 (six) hours as needed for cough.    Marland Kitchen ibuprofen (ADVIL,MOTRIN) 200 MG tablet Take 200 mg by mouth every 6 (six) hours as needed (back  pain). Reported on 06/22/2015    . isosorbide mononitrate (IMDUR) 30 MG 24 hr tablet Take 1 tablet (30 mg total) by mouth daily. 30 tablet 11  . loperamide (IMODIUM) 2 MG capsule Take 2 mg by mouth daily as needed for diarrhea or loose stools.    . magnesium hydroxide (MILK OF MAGNESIA) 400 MG/5ML suspension Take 30 mLs by mouth at bedtime as needed for mild constipation.    . methenamine (MANDELAMINE) 1 g tablet Take 1,000 mg by mouth 2 (two) times daily.    . mirabegron ER (MYRBETRIQ) 25 MG TB24 tablet Take 25 mg by mouth daily.    Marland Kitchen Neomycin-Bacitracin-Polymyxin (HCA TRIPLE ANTIBIOTIC OINTMENT EX) Apply 1 application topically daily as needed (skin tears/abrasions).     . pantoprazole (PROTONIX) 40 MG tablet Take 40 mg by mouth daily.  3  . potassium chloride (K-DUR) 10 MEQ tablet Take 2 tablets (20 mEq total) by mouth 3 (three) times daily. 20 tablet 0  . vitamin C (ASCORBIC ACID) 500 MG tablet Take 500 mg by mouth daily.      No current facility-administered medications on file prior to visit.     Allergies  Allergen Reactions  . Contrast Media [Iodinated Diagnostic Agents] Other (See Comments)    Cardiac arrest  . Metoprolol Other (See Comments)    Per the patient's daughter, this causes the patient's heart to race and resulted in syncope (NAME BRAND TOPROL XL ONLY!!)  . Pyridium [Phenazopyridine Hcl] Anaphylaxis, Shortness Of Breath and Swelling  . Levaquin [Levofloxacin In D5w] Other (See Comments)    Per MAR  . Macrodantin [Nitrofurantoin Macrocrystal] Other (See Comments)    Per MAR  . Other Other (See Comments)    PATIENT CANNOT TOLERATE ANY STICKS OR B/P CUFFS ON HER LEFT ARM BECAUSE OF A FORMER SURGERY (16 lymph nodes were removed due to cancer)  . Tape Other (See Comments)    PATIENT'S SKIN IS VERY THIN AND WILL "SPLIT"; PLEASE USE COBAN WRAP  . Sulfa Antibiotics Other (See Comments)    DEVELOPED INSTANT BLISTERS ON SKIN AND WITHIN THE MOUTH THAT LASTED FOR WEEKS (WITNESSED BY A DOCTOR)    Past Medical History:  Diagnosis Date  . Allergic rhinitis   . Arrhythmia    a. office to call pt on 09/15/13 for 30 day event monitor  . Atrial fibrillation ([place])    a. h/o PAF, in NSR during admission 09/11/13  . Breast cancer ([address])   . Chronic diastolic CHF (congestive heart failure) ([place])    a. echo 09/12/13 EF 40-97%, grade 1 diastolic dysf, mild LVOT obstr, moderate MR, elevated filling pressure  . Dementia   . Dyslipidemia   . Enlarged liver   . Frequent UTI   . GERD (gastroesophageal reflux disease)   . Hard of hearing   . Hemorrhoids   . Hypertension   . Hypoglycemia   . Mitral regurgitation    a. moderate MR by echo 09/12/2013  . OA (osteoarthritis)    hip and back  . Orthostatic hypotension   . Phlebitis    l leg  . Prediabetes     Past Surgical History:  Procedure Laterality Date  . ABDOMINAL HYSTERECTOMY    . BLADDER SUSPENSION    . BREAST LUMPECTOMY    . CARDIAC  CATHETERIZATION  1980's   [facility]  . FOOT SURGERY     left foot  . INNER EAR SURGERY      Social History   Tobacco Use  Smoking  Status Former Smoker  Smokeless Tobacco Never Used    Social History   Substance and Sexual Activity  Alcohol Use No    Family History  Problem Relation Age of Onset  . Cerebral palsy Sister   . Alzheimer's disease Brother   . Hypertension Father   . Heart disease Brother     Reviw of Systems:  Reviewed in the HPI.  All other systems are negative.  Physical Exam: Blood pressure (!) 138/58, pulse 84, height 5\' 2"  (1.575 m), weight 133 lb (60.3 kg), SpO2 97 %. Wt Readings from Last 3 Encounters:  02/13/17 133 lb (60.3 kg)  01/21/17 127 lb (57.6 kg)  01/04/17 127 lb (57.6 kg)    Physical Exam: Blood pressure (!) 138/58, pulse 84, height 5\' 2"  (1.575 m), weight 133 lb (60.3 kg), SpO2 97 %.  GEN:   Elderly female,   , significant dementia  HEENT: Normal NECK: No JVD; No carotid bruits LYMPHATICS: No lymphadenopathy CARDIAC: RR ,   Soft systolic murmur  RESPIRATORY:  Clear to auscultation without rales, wheezing or rhonchi  ABDOMEN: Soft, non-tender, non-distended MUSCULOSKELETAL:  2-3 pitting edema  SKIN: Warm and dry NEUROLOGIC:  Alert and oriented x 3   ECG:  Assessment / Plan:   1. Supraventricular tachycardia  Currently in normal sinus rhythm She has frequent episodes of SVT.   She has tried to take metoprolol but had a reaction according do her daughter.  Will try coreg 3.125 mg BID     Agree with work up to make sure there is no signs of UTI or pnuemonia. She is not a candidate for cardiac cath so I would not thinks she needs to be ruled every time she is there   2. Congestive heart failure  3. Cardiac cath with cardiac arrest 4. Chest discomfort:  These are associated with SVT.    She is not a candidate for invasive procedures ( ablation or pacer )   Continue medical therapy   Will see her in 3 months.     Mertie Moores, MD  02/13/2017 11:08 AM    [place],  [place], [place]  [address] Pager [phone] Phone: [phone]; Fax: [phone]

## 2017-02-13 NOTE — Patient Instructions (Addendum)
Medication Instructions:  Your physician has recommended you make the following change in your medication: START Carvedilol (Coreg) 3.125 mg twice daily   Labwork: None Ordered   Testing/Procedures: None Ordered   Follow-Up: Your physician wants you to follow-up in: 6 months with Dr. Acie Fredrickson.  You will receive a reminder letter in the mail two months in advance. If you don't receive a letter, please call our office to schedule the follow-up appointment.   If you need a refill on your cardiac medications before your next appointment, please call your pharmacy.   Thank you for choosing [facility]! Christen Bame, RN [phone]     For your  leg edema you  should do  the following 1. Leg elevation - I recommend the Lounge Dr. Leg rest.  See below for details  2. Salt restriction  -  Use potassium chloride instead of regular salt as a salt substitute. 3. Walk regularly 4. Compression hose - [facility] 5. Weight loss     Go to [email or web address]  Or available on [email or web address]

## 2017-02-27 ENCOUNTER — Telehealth: Payer: Self-pay | Admitting: Cardiovascular Disease

## 2017-02-27 NOTE — Telephone Encounter (Signed)
New Message     Needs clearance for patient to participate in home health physical therapy, PCP will sign off on orders if you give the patient clearance to participate   Please put any restrictions or concerns in response as well, when you call them back   PCP gave them referral, but they need clarification on if patient is fit enough to do therapy

## 2017-02-27 NOTE — Telephone Encounter (Signed)
Patient was seen in office by Dr. Acie Fredrickson.  Will route to him for input on clearing patient for Home PT, and if so any restrictions or concerns.

## 2017-02-28 NOTE — Telephone Encounter (Signed)
Pt is clear for home physical therapy    Mertie Moores, MD  02/28/2017 7:03 AM    [place] [address],  [place], [place]  [address] Pager [phone] Phone: [phone]; Fax: [phone]

## 2017-02-28 NOTE — Telephone Encounter (Signed)
LMTCB for Alexis Winters

## 2017-03-01 ENCOUNTER — Telehealth: Payer: Self-pay | Admitting: Cardiovascular Disease

## 2017-03-01 NOTE — Telephone Encounter (Signed)
New message     Joe from [facility] returning call. Please call

## 2017-03-01 NOTE — Telephone Encounter (Signed)
Spoke with Wille Glaser, PT with [facility] health to verbalize Dr. Elmarie Shiley cardiac clearance for patient to complete PT. Joe verbalized understanding and thanked me for the call.

## 2017-03-03 ENCOUNTER — Encounter (HOSPITAL_COMMUNITY): Payer: Self-pay | Admitting: Emergency Medicine

## 2017-03-03 ENCOUNTER — Ambulatory Visit (HOSPITAL_COMMUNITY)
Admission: EM | Admit: 2017-03-03 | Discharge: 2017-03-03 | Disposition: A | Discharge status: Home / Self Care | Payer: Medicare Other | Attending: Internal Medicine | Admitting: Internal Medicine

## 2017-03-03 DIAGNOSIS — R079 Chest pain, unspecified: Secondary | ICD-10-CM | POA: Diagnosis not present

## 2017-03-03 DIAGNOSIS — Z853 Personal history of malignant neoplasm of breast: Secondary | ICD-10-CM | POA: Diagnosis not present

## 2017-03-03 DIAGNOSIS — R001 Bradycardia, unspecified: Secondary | ICD-10-CM | POA: Diagnosis not present

## 2017-03-03 DIAGNOSIS — Z87891 Personal history of nicotine dependence: Secondary | ICD-10-CM | POA: Insufficient documentation

## 2017-03-03 DIAGNOSIS — E876 Hypokalemia: Secondary | ICD-10-CM | POA: Insufficient documentation

## 2017-03-03 DIAGNOSIS — I471 Supraventricular tachycardia: Secondary | ICD-10-CM | POA: Diagnosis not present

## 2017-03-03 DIAGNOSIS — I480 Paroxysmal atrial fibrillation: Secondary | ICD-10-CM | POA: Insufficient documentation

## 2017-03-03 DIAGNOSIS — K219 Gastro-esophageal reflux disease without esophagitis: Secondary | ICD-10-CM | POA: Insufficient documentation

## 2017-03-03 DIAGNOSIS — I4892 Unspecified atrial flutter: Secondary | ICD-10-CM | POA: Insufficient documentation

## 2017-03-03 DIAGNOSIS — E785 Hyperlipidemia, unspecified: Secondary | ICD-10-CM | POA: Diagnosis not present

## 2017-03-03 DIAGNOSIS — Z8249 Family history of ischemic heart disease and other diseases of the circulatory system: Secondary | ICD-10-CM | POA: Insufficient documentation

## 2017-03-03 DIAGNOSIS — I110 Hypertensive heart disease with heart failure: Secondary | ICD-10-CM | POA: Diagnosis not present

## 2017-03-03 DIAGNOSIS — G459 Transient cerebral ischemic attack, unspecified: Secondary | ICD-10-CM | POA: Diagnosis not present

## 2017-03-03 DIAGNOSIS — R7303 Prediabetes: Secondary | ICD-10-CM | POA: Diagnosis not present

## 2017-03-03 DIAGNOSIS — M542 Cervicalgia: Secondary | ICD-10-CM | POA: Insufficient documentation

## 2017-03-03 DIAGNOSIS — Z90710 Acquired absence of both cervix and uterus: Secondary | ICD-10-CM | POA: Diagnosis not present

## 2017-03-03 DIAGNOSIS — I5032 Chronic diastolic (congestive) heart failure: Secondary | ICD-10-CM | POA: Diagnosis not present

## 2017-03-03 DIAGNOSIS — Z791 Long term (current) use of non-steroidal anti-inflammatories (NSAID): Secondary | ICD-10-CM | POA: Insufficient documentation

## 2017-03-03 DIAGNOSIS — I340 Nonrheumatic mitral (valve) insufficiency: Secondary | ICD-10-CM | POA: Diagnosis not present

## 2017-03-03 DIAGNOSIS — Z79899 Other long term (current) drug therapy: Secondary | ICD-10-CM | POA: Diagnosis not present

## 2017-03-03 DIAGNOSIS — R627 Adult failure to thrive: Secondary | ICD-10-CM | POA: Insufficient documentation

## 2017-03-03 DIAGNOSIS — Z888 Allergy status to other drugs, medicaments and biological substances status: Secondary | ICD-10-CM | POA: Insufficient documentation

## 2017-03-03 DIAGNOSIS — Z7982 Long term (current) use of aspirin: Secondary | ICD-10-CM | POA: Insufficient documentation

## 2017-03-03 DIAGNOSIS — R21 Rash and other nonspecific skin eruption: Secondary | ICD-10-CM | POA: Insufficient documentation

## 2017-03-03 DIAGNOSIS — Z91041 Radiographic dye allergy status: Secondary | ICD-10-CM | POA: Insufficient documentation

## 2017-03-03 DIAGNOSIS — Z9889 Other specified postprocedural states: Secondary | ICD-10-CM | POA: Insufficient documentation

## 2017-03-03 DIAGNOSIS — Z882 Allergy status to sulfonamides status: Secondary | ICD-10-CM | POA: Insufficient documentation

## 2017-03-03 DIAGNOSIS — M1990 Unspecified osteoarthritis, unspecified site: Secondary | ICD-10-CM | POA: Insufficient documentation

## 2017-03-03 DIAGNOSIS — Z820 Family history of epilepsy and other diseases of the nervous system: Secondary | ICD-10-CM | POA: Insufficient documentation

## 2017-03-03 DIAGNOSIS — F0390 Unspecified dementia without behavioral disturbance: Secondary | ICD-10-CM | POA: Diagnosis not present

## 2017-03-03 DIAGNOSIS — Z91048 Other nonmedicinal substance allergy status: Secondary | ICD-10-CM | POA: Insufficient documentation

## 2017-03-03 DIAGNOSIS — Z886 Allergy status to analgesic agent status: Secondary | ICD-10-CM | POA: Insufficient documentation

## 2017-03-03 DIAGNOSIS — N390 Urinary tract infection, site not specified: Secondary | ICD-10-CM | POA: Insufficient documentation

## 2017-03-03 LAB — POCT URINALYSIS DIP (DEVICE)
Bilirubin Urine: NEGATIVE
GLUCOSE, UA: NEGATIVE mg/dL
KETONES UR: NEGATIVE mg/dL
Nitrite: NEGATIVE
Protein, ur: NEGATIVE mg/dL
SPECIFIC GRAVITY, URINE: 1.01 (ref 1.005–1.030)
UROBILINOGEN UA: 0.2 mg/dL (ref 0.0–1.0)
pH: 6 (ref 5.0–8.0)

## 2017-03-03 MED ORDER — CEPHALEXIN 500 MG PO CAPS: 500.0000 mg | ORAL_CAPSULE | Freq: Four times a day (QID) | ORAL | 0 refills | Status: DC

## 2017-03-03 NOTE — ED Provider Notes (Signed)
[place]    CSN: [id number] Arrival date & time: 03/03/17  1617     History   Chief Complaint Chief Complaint  Patient presents with  . Urinary Tract Infection    HPI Alexis Winters is a 83 y.o. female.   83 year old female with dementia is brought in by the daughter with chief complaint of dysuria and urinary frequency. She has a history of frequent UTIs.      Past Medical History:  Diagnosis Date  . Allergic rhinitis   . Arrhythmia    a. office to call pt on 09/15/13 for 30 day event monitor  . Atrial fibrillation ([place])    a. h/o PAF, in NSR during admission 09/11/13  . Breast cancer ([place])   . Chronic diastolic CHF (congestive heart failure) ([place])    a. echo 09/12/13 EF 59-56%, grade 1 diastolic dysf, mild LVOT obstr, moderate MR, elevated filling pressure  . Dementia   . Dyslipidemia   . Enlarged liver   . Frequent UTI   . GERD (gastroesophageal reflux disease)   . Hard of hearing   . Hemorrhoids   . Hypertension   . Hypoglycemia   . Mitral regurgitation    a. moderate MR by echo 09/12/2013  . OA (osteoarthritis)    hip and back  . Orthostatic hypotension   . Phlebitis    l leg  . Prediabetes     Patient Active Problem List   Diagnosis Date Noted  . TIA (transient ischemic attack) 11/15/2016  . GERD (gastroesophageal reflux disease) 11/15/2016  . Atrial fibrillation ([place])   . SVT (supraventricular tachycardia) ([place]) 08/18/2016  . Dementia without behavioral disturbance 08/18/2016  . UTI (urinary tract infection) 05/29/2016  . Hypokalemia 04/28/2016  . Rash and nonspecific skin eruption 04/28/2016  . Bradycardia 04/27/2016  . Atrial flutter, paroxysmal ([place]) 06/22/2015  . Near syncope 01/10/2015  . Neck pain on left side 01/10/2015  . Pulmonary nodule, right 01/10/2015  . Leukocytosis 01/10/2015  . Hypocalcemia 01/10/2015  . Dyspnea 01/10/2015  . Chest pain 09/11/2013  . Bilateral neck pain 09/11/2013  . Angina at rest [facility])  09/11/2013  . Leg edema 09/05/2013  . Failure to thrive 09/05/2013  . Paroxysmal atrial fibrillation ([place]) 09/05/2013  . Chronic diastolic CHF (congestive heart failure) ([place]) 09/05/2013    Past Surgical History:  Procedure Laterality Date  . ABDOMINAL HYSTERECTOMY    . BLADDER SUSPENSION    . BREAST LUMPECTOMY    . CARDIAC CATHETERIZATION  1980's   [facility]  . FOOT SURGERY     left foot  . INNER EAR SURGERY      OB History    No data available       Home Medications    Prior to Admission medications   Medication Sig Start Date End Date Taking? Authorizing Provider  acetaminophen (TYLENOL) 500 MG tablet Take 500 mg by mouth every 4 (four) hours as needed for mild pain, fever or headache.     [provider]  alum & mag hydroxide-simeth ([place]) 200-200-20 MG/5ML suspension Take 30 mLs by mouth every 6 (six) hours as needed for indigestion or heartburn.    [provider]  aspirin 325 MG tablet Take 1 tablet (325 mg total) by mouth daily. 11/17/16   Dhungel, Flonnie Overman, MD  atorvastatin (LIPITOR) 40 MG tablet Take 1 tablet (40 mg total) by mouth daily at 6 PM. 11/16/16   Dhungel, Nishant, MD  carvedilol (COREG) 3.125 MG tablet Take 1 tablet (  3.125 mg total) by mouth 2 (two) times daily. 02/13/17 05/14/17  Nahser, Wonda Cheng, MD  cephALEXin (KEFLEX) 500 MG capsule Take 1 capsule (500 mg total) by mouth 4 (four) times daily. 03/03/17   Janne Napoleon, NP  Cranberry 450 MG TABS Take 450 mg by mouth 2 (two) times daily.     [provider]  furosemide (LASIX) 40 MG tablet Take 40 mg by mouth 2 (two) times daily.  04/09/12   [provider]  guaifenesin (ROBITUSSIN) 100 MG/5ML syrup Take 200 mg by mouth every 6 (six) hours as needed for cough.    [provider]  ibuprofen (ADVIL,MOTRIN) 200 MG tablet Take 200 mg by mouth every 6 (six) hours as needed (back pain). Reported on 06/22/2015    [provider]  isosorbide mononitrate (IMDUR) 30 MG 24 hr  tablet Take 1 tablet (30 mg total) by mouth daily. 10/13/15   Nahser, Wonda Cheng, MD  loperamide (IMODIUM) 2 MG capsule Take 2 mg by mouth daily as needed for diarrhea or loose stools.    [provider]  magnesium hydroxide (MILK OF MAGNESIA) 400 MG/5ML suspension Take 30 mLs by mouth at bedtime as needed for mild constipation.    [provider]  methenamine (MANDELAMINE) 1 g tablet Take 1,000 mg by mouth 2 (two) times daily.    [provider]  mirabegron ER (MYRBETRIQ) 25 MG TB24 tablet Take 25 mg by mouth daily.    [provider]  Neomycin-Bacitracin-Polymyxin (HCA TRIPLE ANTIBIOTIC OINTMENT EX) Apply 1 application topically daily as needed (skin tears/abrasions).     [provider]  pantoprazole (PROTONIX) 40 MG tablet Take 40 mg by mouth daily. 10/19/14   [provider]  potassium chloride (K-DUR) 10 MEQ tablet Take 2 tablets (20 mEq total) by mouth 3 (three) times daily. 06/21/16   Sherwood Gambler, MD  vitamin C (ASCORBIC ACID) 500 MG tablet Take 500 mg by mouth daily.    [provider]    Family History Family History  Problem Relation Age of Onset  . Cerebral palsy Sister   . Alzheimer's disease Brother   . Hypertension Father   . Heart disease Brother     Social History Social History   Tobacco Use  . Smoking status: Former Research scientist (life sciences)  . Smokeless tobacco: Never Used  Substance Use Topics  . Alcohol use: No  . Drug use: No     Allergies   Contrast media [iodinated diagnostic agents]; Metoprolol; Pyridium [phenazopyridine hcl]; Levaquin [levofloxacin in d5w]; Macrodantin [nitrofurantoin macrocrystal]; Other; Tape; and Sulfa antibiotics   Review of Systems Review of Systems  Constitutional: Negative.   Respiratory: Negative.   Genitourinary: Positive for dysuria, frequency and urgency.  All other systems reviewed and are negative.    Physical Exam Triage Vital Signs ED Triage Vitals [03/03/17 1723]  Enc  Vitals Group     BP (!) 165/79     Pulse Rate 65     Resp      Temp 98.2 F (36.8 C)     Temp Source Oral     SpO2 100 %     Weight      Height      Head Circumference      Peak Flow      Pain Score      Pain Loc      Pain Edu?      Excl. in [place]?    No data found.  Updated Vital Signs BP Marland Kitchen)  165/79 (BP Location: Right Arm)   Pulse 65   Temp 98.2 F (36.8 C) (Oral)   SpO2 100%   Visual Acuity Right Eye Distance:   Left Eye Distance:   Bilateral Distance:    Right Eye Near:   Left Eye Near:    Bilateral Near:     Physical Exam  Constitutional: She appears well-nourished. No distress.  Eyes: EOM are normal.  Neck: Neck supple.  Cardiovascular: Normal rate.  Pulmonary/Chest: Effort normal. No respiratory distress.  Musculoskeletal: She exhibits no edema.  Neurological: She is alert. She exhibits normal muscle tone.  Skin: Skin is warm and dry.  Psychiatric: She has a normal mood and affect.  Nursing note and vitals reviewed.    UC Treatments / Results  Labs (all labs ordered are listed, but only abnormal results are displayed) Labs Reviewed  POCT URINALYSIS DIP (DEVICE) - Abnormal; Notable for the following components:      Result Value   Hgb urine dipstick TRACE (*)    Leukocytes, UA LARGE (*)    All other components within normal limits    EKG  EKG Interpretation None       Radiology No results found.  Procedures Procedures (including critical care time)  Medications Ordered in UC Medications - No data to display   Initial Impression / Assessment and Plan / UC Course  I have reviewed the triage vital signs and the nursing notes.  Pertinent labs & imaging results that were available during my care of the patient were reviewed by me and considered in my medical decision making (see chart for details).       Final Clinical Impressions(s) / UC Diagnoses   Final diagnoses:  Lower urinary tract infectious disease    ED Discharge  Orders        Ordered    cephALEXin (KEFLEX) 500 MG capsule  4 times daily     03/03/17 1750       Controlled Substance Prescriptions [place] Controlled Substance Registry consulted? Not Applicable   Janne Napoleon, NP 03/03/17 1752

## 2017-03-03 NOTE — ED Triage Notes (Signed)
Pt is in a memory care facility and called daughter to let her know that she has been going to urinate a lot and it is burning and itching a lot. Since she has checked in about an hour ago she has had to urinate about 3 times.

## 2017-03-05 LAB — URINE CULTURE: Culture: >=100000 — AB

## 2017-03-07 ENCOUNTER — Telehealth: Payer: Self-pay | Admitting: Cardiovascular Disease

## 2017-03-07 NOTE — Telephone Encounter (Signed)
Routing to Dr. Acie Fredrickson for documentation of clearance for PT

## 2017-03-07 NOTE — Telephone Encounter (Signed)
Alexis Winters is clear to participate in physical therapy.

## 2017-03-07 NOTE — Telephone Encounter (Signed)
Luster Landsberg , PT at  [facility]) because he called the office last week and got Verbal Clearance for him to see Mrs. Burdine, but he is needing to have something faxed over to Dr. Valetta Close requesting clearance for Physical Therapy. Please fax to [phone]. Thanks

## 2017-03-07 NOTE — Telephone Encounter (Signed)
Clearance faxed to [phone] and confirmation received

## 2017-03-27 DIAGNOSIS — I10 Essential (primary) hypertension: Secondary | ICD-10-CM | POA: Diagnosis not present

## 2017-03-28 DIAGNOSIS — I10 Essential (primary) hypertension: Secondary | ICD-10-CM | POA: Diagnosis not present

## 2017-03-29 DIAGNOSIS — I10 Essential (primary) hypertension: Secondary | ICD-10-CM | POA: Diagnosis not present

## 2017-03-30 DIAGNOSIS — I10 Essential (primary) hypertension: Secondary | ICD-10-CM | POA: Diagnosis not present

## 2017-03-31 DIAGNOSIS — I10 Essential (primary) hypertension: Secondary | ICD-10-CM | POA: Diagnosis not present

## 2017-04-01 DIAGNOSIS — I10 Essential (primary) hypertension: Secondary | ICD-10-CM | POA: Diagnosis not present

## 2017-04-02 DIAGNOSIS — I10 Essential (primary) hypertension: Secondary | ICD-10-CM | POA: Diagnosis not present

## 2017-04-03 DIAGNOSIS — I10 Essential (primary) hypertension: Secondary | ICD-10-CM | POA: Diagnosis not present

## 2017-04-04 DIAGNOSIS — I10 Essential (primary) hypertension: Secondary | ICD-10-CM | POA: Diagnosis not present

## 2017-04-05 DIAGNOSIS — I10 Essential (primary) hypertension: Secondary | ICD-10-CM | POA: Diagnosis not present

## 2017-04-06 DIAGNOSIS — I10 Essential (primary) hypertension: Secondary | ICD-10-CM | POA: Diagnosis not present

## 2017-04-07 DIAGNOSIS — I10 Essential (primary) hypertension: Secondary | ICD-10-CM | POA: Diagnosis not present

## 2017-04-08 DIAGNOSIS — I10 Essential (primary) hypertension: Secondary | ICD-10-CM | POA: Diagnosis not present

## 2017-04-09 DIAGNOSIS — I10 Essential (primary) hypertension: Secondary | ICD-10-CM | POA: Diagnosis not present

## 2017-04-10 DIAGNOSIS — I10 Essential (primary) hypertension: Secondary | ICD-10-CM | POA: Diagnosis not present

## 2017-04-11 DIAGNOSIS — I10 Essential (primary) hypertension: Secondary | ICD-10-CM | POA: Diagnosis not present

## 2017-04-12 DIAGNOSIS — I10 Essential (primary) hypertension: Secondary | ICD-10-CM | POA: Diagnosis not present

## 2017-04-13 DIAGNOSIS — I10 Essential (primary) hypertension: Secondary | ICD-10-CM | POA: Diagnosis not present

## 2017-04-14 DIAGNOSIS — I10 Essential (primary) hypertension: Secondary | ICD-10-CM | POA: Diagnosis not present

## 2017-04-15 DIAGNOSIS — I10 Essential (primary) hypertension: Secondary | ICD-10-CM | POA: Diagnosis not present

## 2017-04-16 DIAGNOSIS — I10 Essential (primary) hypertension: Secondary | ICD-10-CM | POA: Diagnosis not present

## 2017-04-17 DIAGNOSIS — I10 Essential (primary) hypertension: Secondary | ICD-10-CM | POA: Diagnosis not present

## 2017-04-18 DIAGNOSIS — I10 Essential (primary) hypertension: Secondary | ICD-10-CM | POA: Diagnosis not present

## 2017-04-19 DIAGNOSIS — I10 Essential (primary) hypertension: Secondary | ICD-10-CM | POA: Diagnosis not present

## 2017-04-20 DIAGNOSIS — I10 Essential (primary) hypertension: Secondary | ICD-10-CM | POA: Diagnosis not present

## 2017-04-21 DIAGNOSIS — I10 Essential (primary) hypertension: Secondary | ICD-10-CM | POA: Diagnosis not present

## 2017-04-22 DIAGNOSIS — I10 Essential (primary) hypertension: Secondary | ICD-10-CM | POA: Diagnosis not present

## 2017-04-23 DIAGNOSIS — I10 Essential (primary) hypertension: Secondary | ICD-10-CM | POA: Diagnosis not present

## 2017-04-24 DIAGNOSIS — I10 Essential (primary) hypertension: Secondary | ICD-10-CM | POA: Diagnosis not present

## 2017-04-25 DIAGNOSIS — I10 Essential (primary) hypertension: Secondary | ICD-10-CM | POA: Diagnosis not present

## 2017-04-26 DIAGNOSIS — I10 Essential (primary) hypertension: Secondary | ICD-10-CM | POA: Diagnosis not present

## 2017-04-27 DIAGNOSIS — I10 Essential (primary) hypertension: Secondary | ICD-10-CM | POA: Diagnosis not present

## 2017-04-28 DIAGNOSIS — I10 Essential (primary) hypertension: Secondary | ICD-10-CM | POA: Diagnosis not present

## 2017-04-29 DIAGNOSIS — I10 Essential (primary) hypertension: Secondary | ICD-10-CM | POA: Diagnosis not present

## 2017-04-30 DIAGNOSIS — I10 Essential (primary) hypertension: Secondary | ICD-10-CM | POA: Diagnosis not present

## 2017-05-01 DIAGNOSIS — I10 Essential (primary) hypertension: Secondary | ICD-10-CM | POA: Diagnosis not present

## 2017-05-02 DIAGNOSIS — I10 Essential (primary) hypertension: Secondary | ICD-10-CM | POA: Diagnosis not present

## 2017-05-03 DIAGNOSIS — I10 Essential (primary) hypertension: Secondary | ICD-10-CM | POA: Diagnosis not present

## 2017-05-04 DIAGNOSIS — I10 Essential (primary) hypertension: Secondary | ICD-10-CM | POA: Diagnosis not present

## 2017-05-05 DIAGNOSIS — I10 Essential (primary) hypertension: Secondary | ICD-10-CM | POA: Diagnosis not present

## 2017-05-06 DIAGNOSIS — I10 Essential (primary) hypertension: Secondary | ICD-10-CM | POA: Diagnosis not present

## 2017-05-07 DIAGNOSIS — I10 Essential (primary) hypertension: Secondary | ICD-10-CM | POA: Diagnosis not present

## 2017-05-08 DIAGNOSIS — I10 Essential (primary) hypertension: Secondary | ICD-10-CM | POA: Diagnosis not present

## 2017-05-09 DIAGNOSIS — I10 Essential (primary) hypertension: Secondary | ICD-10-CM | POA: Diagnosis not present

## 2017-05-10 ENCOUNTER — Emergency Department (HOSPITAL_COMMUNITY)
Admission: EM | Admit: 2017-05-10 | Discharge: 2017-05-10 | Disposition: A | Discharge status: Home / Self Care | Payer: Medicare HMO | Attending: Emergency Medicine | Admitting: Emergency Medicine

## 2017-05-10 ENCOUNTER — Emergency Department (HOSPITAL_COMMUNITY): Payer: Medicare HMO

## 2017-05-10 ENCOUNTER — Encounter (HOSPITAL_COMMUNITY): Payer: Self-pay | Admitting: Emergency Medicine

## 2017-05-10 DIAGNOSIS — R079 Chest pain, unspecified: Secondary | ICD-10-CM | POA: Diagnosis not present

## 2017-05-10 DIAGNOSIS — I110 Hypertensive heart disease with heart failure: Secondary | ICD-10-CM | POA: Insufficient documentation

## 2017-05-10 DIAGNOSIS — Z7982 Long term (current) use of aspirin: Secondary | ICD-10-CM | POA: Diagnosis not present

## 2017-05-10 DIAGNOSIS — Z79899 Other long term (current) drug therapy: Secondary | ICD-10-CM | POA: Insufficient documentation

## 2017-05-10 DIAGNOSIS — Z87891 Personal history of nicotine dependence: Secondary | ICD-10-CM | POA: Insufficient documentation

## 2017-05-10 DIAGNOSIS — R404 Transient alteration of awareness: Secondary | ICD-10-CM | POA: Diagnosis not present

## 2017-05-10 DIAGNOSIS — I10 Essential (primary) hypertension: Secondary | ICD-10-CM | POA: Diagnosis not present

## 2017-05-10 DIAGNOSIS — F0390 Unspecified dementia without behavioral disturbance: Secondary | ICD-10-CM | POA: Diagnosis not present

## 2017-05-10 DIAGNOSIS — R531 Weakness: Secondary | ICD-10-CM | POA: Diagnosis not present

## 2017-05-10 DIAGNOSIS — I5032 Chronic diastolic (congestive) heart failure: Secondary | ICD-10-CM | POA: Diagnosis not present

## 2017-05-10 LAB — URINALYSIS, ROUTINE W REFLEX MICROSCOPIC
Bilirubin Urine: NEGATIVE
Glucose, UA: NEGATIVE mg/dL
Hgb urine dipstick: NEGATIVE
KETONES UR: NEGATIVE mg/dL
Leukocytes, UA: NEGATIVE
NITRITE: NEGATIVE
PROTEIN: NEGATIVE mg/dL
Specific Gravity, Urine: 1.003 — ABNORMAL LOW (ref 1.005–1.030)
pH: 7 (ref 5.0–8.0)

## 2017-05-10 LAB — BASIC METABOLIC PANEL
ANION GAP: 9 (ref 5–15)
BUN: 20 mg/dL (ref 6–20)
CHLORIDE: 107 mmol/L (ref 101–111)
CO2: 27 mmol/L (ref 22–32)
Calcium: 8.8 mg/dL — ABNORMAL LOW (ref 8.9–10.3)
Creatinine, Ser: 0.94 mg/dL (ref 0.44–1.00)
GFR calc Af Amer: >60 mL/min (ref >60)
GFR, EST NON AFRICAN AMERICAN: 53 mL/min — AB (ref >60)
GLUCOSE: 111 mg/dL — AB (ref 65–99)
POTASSIUM: 3.2 mmol/L — AB (ref 3.5–5.1)
Sodium: 143 mmol/L (ref 135–145)

## 2017-05-10 LAB — CBC
HEMATOCRIT: 43.3 % (ref 36.0–46.0)
HEMOGLOBIN: 14.2 g/dL (ref 12.0–15.0)
MCH: 29.4 pg (ref 26.0–34.0)
MCHC: 32.8 g/dL (ref 30.0–36.0)
MCV: 89.6 fL (ref 78.0–100.0)
Platelets: 125 10*3/uL — ABNORMAL LOW (ref 150–400)
RBC: 4.83 MIL/uL (ref 3.87–5.11)
RDW: 13.5 % (ref 11.5–15.5)
WBC: 6.6 10*3/uL (ref 4.0–10.5)

## 2017-05-10 LAB — I-STAT TROPONIN, ED: Troponin i, poc: 0 ng/mL (ref 0.00–0.08)

## 2017-05-10 LAB — BRAIN NATRIURETIC PEPTIDE: B NATRIURETIC PEPTIDE 5: 223.5 pg/mL — AB (ref 0.0–100.0)

## 2017-05-10 NOTE — ED Triage Notes (Signed)
Pt arrives via EMS from [facility] with complaints of chest pain. Fire was first on seen and reports HR was in 140s. When EMS arrived, HR was 68 NSR and chest pain had resolved. 324 ASA given. Hx of Aflutter and dementia.

## 2017-05-10 NOTE — ED Provider Notes (Signed)
[facility] EMERGENCY DEPARTMENT Provider Note   CSN: [id number] Arrival date & time: 05/10/17  1148     History   Chief Complaint Chief Complaint  Patient presents with  . Chest Pain    HPI GISELLE BRUTUS is a 84 y.o. female.  HPI   LAMERLE JABS is a 84 y.o. female, with a history of A. fib, CHF, dementia, GERD, and HTN, presenting to the ED with chest pain beginning today shortly prior to arrival. States, "I had one of my heart spells." Pain was central chest, radiating to the left chest, "feels like someone grabbing or stabbing my chest," lasted for less than a minute.  Patient is a DNR. Denies shortness of breath, cough, dizziness, N/V/D, abdominal pain, or any other complaints.   Past Medical History:  Diagnosis Date  . Allergic rhinitis   . Arrhythmia    a. office to call pt on 09/15/13 for 30 day event monitor  . Atrial fibrillation ([place])    a. h/o PAF, in NSR during admission 09/11/13  . Breast cancer ([place])   . Chronic diastolic CHF (congestive heart failure) ([place])    a. echo 09/12/13 EF 38-10%, grade 1 diastolic dysf, mild LVOT obstr, moderate MR, elevated filling pressure  . Dementia   . Dyslipidemia   . Enlarged liver   . Frequent UTI   . GERD (gastroesophageal reflux disease)   . Hard of hearing   . Hemorrhoids   . Hypertension   . Hypoglycemia   . Mitral regurgitation    a. moderate MR by echo 09/12/2013  . OA (osteoarthritis)    hip and back  . Orthostatic hypotension   . Phlebitis    l leg  . Prediabetes     Patient Active Problem List   Diagnosis Date Noted  . TIA (transient ischemic attack) 11/15/2016  . GERD (gastroesophageal reflux disease) 11/15/2016  . Atrial fibrillation ([place])   . SVT (supraventricular tachycardia) ([place]) 08/18/2016  . Dementia without behavioral disturbance 08/18/2016  . UTI (urinary tract infection) 05/29/2016  . Hypokalemia 04/28/2016  . Rash and nonspecific skin eruption 04/28/2016  .  Bradycardia 04/27/2016  . Atrial flutter, paroxysmal ([place]) 06/22/2015  . Near syncope 01/10/2015  . Neck pain on left side 01/10/2015  . Pulmonary nodule, right 01/10/2015  . Leukocytosis 01/10/2015  . Hypocalcemia 01/10/2015  . Dyspnea 01/10/2015  . Chest pain 09/11/2013  . Bilateral neck pain 09/11/2013  . Angina at rest [facility]) 09/11/2013  . Leg edema 09/05/2013  . Failure to thrive 09/05/2013  . Paroxysmal atrial fibrillation ([place]) 09/05/2013  . Chronic diastolic CHF (congestive heart failure) ([place]) 09/05/2013    Past Surgical History:  Procedure Laterality Date  . ABDOMINAL HYSTERECTOMY    . BLADDER SUSPENSION    . BREAST LUMPECTOMY    . CARDIAC CATHETERIZATION  1980's   [facility]  . FOOT SURGERY     left foot  . INNER EAR SURGERY      OB History    No data available       Home Medications    Prior to Admission medications   Medication Sig Start Date End Date Taking? Authorizing Provider  acetaminophen (TYLENOL) 500 MG tablet Take 500 mg by mouth every 4 (four) hours as needed for mild pain, fever or headache.     [provider]  alum & mag hydroxide-simeth ([place]) 200-200-20 MG/5ML suspension Take 30 mLs by mouth every 6 (six) hours as needed for indigestion or heartburn.  [provider]  aspirin 325 MG tablet Take 1 tablet (325 mg total) by mouth daily. 11/17/16   Dhungel, Flonnie Overman, MD  atorvastatin (LIPITOR) 40 MG tablet Take 1 tablet (40 mg total) by mouth daily at 6 PM. 11/16/16   Dhungel, Nishant, MD  carvedilol (COREG) 3.125 MG tablet Take 1 tablet (3.125 mg total) by mouth 2 (two) times daily. 02/13/17 05/14/17  Nahser, Wonda Cheng, MD  cephALEXin (KEFLEX) 500 MG capsule Take 1 capsule (500 mg total) by mouth 4 (four) times daily. 03/03/17   Janne Napoleon, NP  Cranberry 450 MG TABS Take 450 mg by mouth 2 (two) times daily.     [provider]  furosemide (LASIX) 40 MG tablet Take 40 mg by mouth 2 (two) times daily.  04/09/12   [provider]  guaifenesin (ROBITUSSIN) 100 MG/5ML syrup Take 200 mg by mouth every 6 (six) hours as needed for cough.    [provider]  ibuprofen (ADVIL,MOTRIN) 200 MG tablet Take 200 mg by mouth every 6 (six) hours as needed (back pain). Reported on 06/22/2015    [provider]  isosorbide mononitrate (IMDUR) 30 MG 24 hr tablet Take 1 tablet (30 mg total) by mouth daily. 10/13/15   Nahser, Wonda Cheng, MD  loperamide (IMODIUM) 2 MG capsule Take 2 mg by mouth daily as needed for diarrhea or loose stools.    [provider]  magnesium hydroxide (MILK OF MAGNESIA) 400 MG/5ML suspension Take 30 mLs by mouth at bedtime as needed for mild constipation.    [provider]  methenamine (MANDELAMINE) 1 g tablet Take 1,000 mg by mouth 2 (two) times daily.    [provider]  mirabegron ER (MYRBETRIQ) 25 MG TB24 tablet Take 25 mg by mouth daily.    [provider]  Neomycin-Bacitracin-Polymyxin (HCA TRIPLE ANTIBIOTIC OINTMENT EX) Apply 1 application topically daily as needed (skin tears/abrasions).     [provider]  pantoprazole (PROTONIX) 40 MG tablet Take 40 mg by mouth daily. 10/19/14   [provider]  potassium chloride (K-DUR) 10 MEQ tablet Take 2 tablets (20 mEq total) by mouth 3 (three) times daily. 06/21/16   Sherwood Gambler, MD  vitamin C (ASCORBIC ACID) 500 MG tablet Take 500 mg by mouth daily.    [provider]    Family History Family History  Problem Relation Age of Onset  . Cerebral palsy Sister   . Alzheimer's disease Brother   . Hypertension Father   . Heart disease Brother     Social History Social History   Tobacco Use  . Smoking status: Former Research scientist (life sciences)  . Smokeless tobacco: Never Used  Substance Use Topics  . Alcohol use: No  . Drug use: No     Allergies   Contrast media [iodinated diagnostic agents]; Metoprolol; Pyridium [phenazopyridine hcl]; Levaquin [levofloxacin in d5w]; Macrodantin  [nitrofurantoin macrocrystal]; Other; Tape; and Sulfa antibiotics   Review of Systems Review of Systems  Constitutional: Negative for chills, diaphoresis and fever.  Respiratory: Negative for cough and shortness of breath.   Cardiovascular: Positive for chest pain. Negative for leg swelling.  Gastrointestinal: Negative for abdominal pain, diarrhea, nausea and vomiting.  Musculoskeletal: Negative for back pain.  Neurological: Negative for dizziness, syncope and light-headedness.  All other systems reviewed and are negative.    Physical Exam Updated Vital Signs BP 126/67   Pulse 64   Temp (!) 97.5 F (36.4 C) (Oral)   Resp 14   SpO2 99%  Physical Exam  Constitutional: She appears well-developed and well-nourished. No distress.  HENT:  Head: Normocephalic and atraumatic.  Eyes: Conjunctivae are normal.  Neck: Neck supple.  Cardiovascular: Normal rate, regular rhythm, normal heart sounds and intact distal pulses.  Pulmonary/Chest: Effort normal and breath sounds normal. No respiratory distress.  Patient appears relaxed. No increased work of breathing. Speaks in full sentences without difficulty.   Abdominal: Soft. There is no tenderness. There is no guarding.  Musculoskeletal: She exhibits edema.  Bilateral peripheral lower extremity edema, appears chronic.   Lymphadenopathy:    She has no cervical adenopathy.  Neurological: She is alert.  Skin: Skin is warm and dry. Capillary refill takes less than 2 seconds. She is not diaphoretic.  Psychiatric: She has a normal mood and affect. Her behavior is normal.  Nursing note and vitals reviewed.    ED Treatments / Results  Labs (all labs ordered are listed, but only abnormal results are displayed) Labs Reviewed  BASIC METABOLIC PANEL - Abnormal; Notable for the following components:      Result Value   Potassium 3.2 (*)    Glucose, Bld 111 (*)    Calcium 8.8 (*)    GFR calc non Af Amer 53 (*)    All other components  within normal limits  CBC - Abnormal; Notable for the following components:   Platelets 125 (*)    All other components within normal limits  BRAIN NATRIURETIC PEPTIDE - Abnormal; Notable for the following components:   B Natriuretic Peptide 223.5 (*)    All other components within normal limits  URINALYSIS, ROUTINE W REFLEX MICROSCOPIC - Abnormal; Notable for the following components:   Color, Urine COLORLESS (*)    Specific Gravity, Urine 1.003 (*)    All other components within normal limits  I-STAT TROPONIN, ED    EKG  EKG Interpretation  Date/Time:  Thursday May 10 2017 11:53:21 EST Ventricular Rate:  70 PR Interval:    QRS Duration: 94 QT Interval:  397 QTC Calculation: 429 R Axis:   35 Text Interpretation:  Sinus rhythm Abnormal R-wave progression, early transition Confirmed by Quintella Reichert [phone]) on 05/10/2017 2:37:35 PM       Radiology Dg Chest 2 View  Result Date: 05/10/2017 CLINICAL DATA:  Chest pain EXAM: CHEST  2 VIEW COMPARISON:  01/21/2017 FINDINGS: Negative for heart failure or pneumonia. Lungs are clear. No effusion Large hiatal hernia.  Atherosclerotic aorta IMPRESSION: No active cardiopulmonary disease. Electronically Signed   By: Franchot Gallo M.D.   On: 05/10/2017 13:10    Procedures Procedures (including critical care time)  Medications Ordered in ED Medications - No data to display   Initial Impression / Assessment and Plan / ED Course  I have reviewed the triage vital signs and the nursing notes.  Pertinent labs & imaging results that were available during my care of the patient were reviewed by me and considered in my medical decision making (see chart for details).  Clinical Course as of May 11 1623  Thu May 10, 2017  1237 Spoke with patient's daughter and POA, Andrey Campanile. States patient will have episodes of a flutter/A. fib that are typically transient.  They typically will do initial testing at the hospital in the ED to rule out  precipitating factors, such as UTI, and then discharge the patient back to the SNF.  States she is comfortable with this plan today. Adds that patient is a DNR.   [SJ]  7858 Patient continues to be symptom-free.  Patient's daughter state they are comfortable with discharge.  Daughter prefers to follow-up with the patient's PCP regarding her hypokalemia.  [SJ]    Clinical Course User Index [SJ] Joy, Shawn C, PA-C   Patient presents with an episode of transient chest pain.  Patient is nontoxic appearing, afebrile, not tachycardic, not tachypneic, not hypotensive, maintains SPO2 of 97-100% on room air, and is in no apparent distress.  Patient remained symptom-free throughout ED course.  Troponin negative.  Chest x-ray without acute abnormality. Patient and her daughter were given instructions for home care as well as return precautions. Both parties voice understanding of these instructions, accept the plan, and are comfortable with discharge.    Findings and plan of care discussed with Quintella Reichert, MD. Dr. Ralene Bathe personally evaluated and examined this patient.  Vitals:   05/10/17 1230 05/10/17 1300 05/10/17 1330 05/10/17 1430  BP: (!) 111/52 114/61 (!) 121/55 126/67  Pulse: 64 62 63 64  Resp: 17  (!) 21 14  Temp:      TempSrc:      SpO2: 98% 97% 100% 99%     Final Clinical Impressions(s) / ED Diagnoses   Final diagnoses:  Chest pain, unspecified type    ED Discharge Orders    None       Layla Maw 05/10/17 1625    Quintella Reichert, MD 05/11/17 [phone]

## 2017-05-10 NOTE — Discharge Instructions (Signed)
You have been seen today following an instance of chest pain.  This did not recur.

## 2017-05-11 DIAGNOSIS — I10 Essential (primary) hypertension: Secondary | ICD-10-CM | POA: Diagnosis not present

## 2017-05-12 DIAGNOSIS — I10 Essential (primary) hypertension: Secondary | ICD-10-CM | POA: Diagnosis not present

## 2017-05-13 DIAGNOSIS — I10 Essential (primary) hypertension: Secondary | ICD-10-CM | POA: Diagnosis not present

## 2017-05-14 DIAGNOSIS — G301 Alzheimer's disease with late onset: Secondary | ICD-10-CM | POA: Diagnosis not present

## 2017-05-14 DIAGNOSIS — Z66 Do not resuscitate: Secondary | ICD-10-CM | POA: Diagnosis not present

## 2017-05-14 DIAGNOSIS — I10 Essential (primary) hypertension: Secondary | ICD-10-CM | POA: Diagnosis not present

## 2017-05-14 DIAGNOSIS — I495 Sick sinus syndrome: Secondary | ICD-10-CM | POA: Diagnosis not present

## 2017-05-14 DIAGNOSIS — R079 Chest pain, unspecified: Secondary | ICD-10-CM | POA: Diagnosis not present

## 2017-05-15 DIAGNOSIS — I10 Essential (primary) hypertension: Secondary | ICD-10-CM | POA: Diagnosis not present

## 2017-05-16 DIAGNOSIS — I10 Essential (primary) hypertension: Secondary | ICD-10-CM | POA: Diagnosis not present

## 2017-05-17 DIAGNOSIS — I10 Essential (primary) hypertension: Secondary | ICD-10-CM | POA: Diagnosis not present

## 2017-05-18 DIAGNOSIS — I10 Essential (primary) hypertension: Secondary | ICD-10-CM | POA: Diagnosis not present

## 2017-05-19 DIAGNOSIS — I10 Essential (primary) hypertension: Secondary | ICD-10-CM | POA: Diagnosis not present

## 2017-05-20 DIAGNOSIS — I10 Essential (primary) hypertension: Secondary | ICD-10-CM | POA: Diagnosis not present

## 2017-05-21 DIAGNOSIS — I10 Essential (primary) hypertension: Secondary | ICD-10-CM | POA: Diagnosis not present

## 2017-05-22 DIAGNOSIS — I10 Essential (primary) hypertension: Secondary | ICD-10-CM | POA: Diagnosis not present

## 2017-05-23 DIAGNOSIS — I10 Essential (primary) hypertension: Secondary | ICD-10-CM | POA: Diagnosis not present

## 2017-05-24 DIAGNOSIS — I10 Essential (primary) hypertension: Secondary | ICD-10-CM | POA: Diagnosis not present

## 2017-05-25 DIAGNOSIS — I10 Essential (primary) hypertension: Secondary | ICD-10-CM | POA: Diagnosis not present

## 2017-05-26 DIAGNOSIS — I10 Essential (primary) hypertension: Secondary | ICD-10-CM | POA: Diagnosis not present

## 2017-05-27 DIAGNOSIS — I10 Essential (primary) hypertension: Secondary | ICD-10-CM | POA: Diagnosis not present

## 2017-05-28 DIAGNOSIS — I10 Essential (primary) hypertension: Secondary | ICD-10-CM | POA: Diagnosis not present

## 2017-05-29 DIAGNOSIS — I10 Essential (primary) hypertension: Secondary | ICD-10-CM | POA: Diagnosis not present

## 2017-05-30 DIAGNOSIS — I10 Essential (primary) hypertension: Secondary | ICD-10-CM | POA: Diagnosis not present

## 2017-05-31 DIAGNOSIS — I10 Essential (primary) hypertension: Secondary | ICD-10-CM | POA: Diagnosis not present

## 2017-06-01 DIAGNOSIS — I10 Essential (primary) hypertension: Secondary | ICD-10-CM | POA: Diagnosis not present

## 2017-06-02 DIAGNOSIS — I10 Essential (primary) hypertension: Secondary | ICD-10-CM | POA: Diagnosis not present

## 2017-06-03 DIAGNOSIS — I10 Essential (primary) hypertension: Secondary | ICD-10-CM | POA: Diagnosis not present

## 2017-06-04 DIAGNOSIS — I10 Essential (primary) hypertension: Secondary | ICD-10-CM | POA: Diagnosis not present

## 2017-06-05 DIAGNOSIS — I10 Essential (primary) hypertension: Secondary | ICD-10-CM | POA: Diagnosis not present

## 2017-06-06 DIAGNOSIS — I10 Essential (primary) hypertension: Secondary | ICD-10-CM | POA: Diagnosis not present

## 2017-06-06 IMAGING — CR DG CHEST 1V PORT
1 series · 1 of 1 positions shown · non-contrast
Comparison: Radiographs May 07, 2016.

CLINICAL DATA: Chest pain.

EXAM:
PORTABLE CHEST 1 VIEW

[AP]
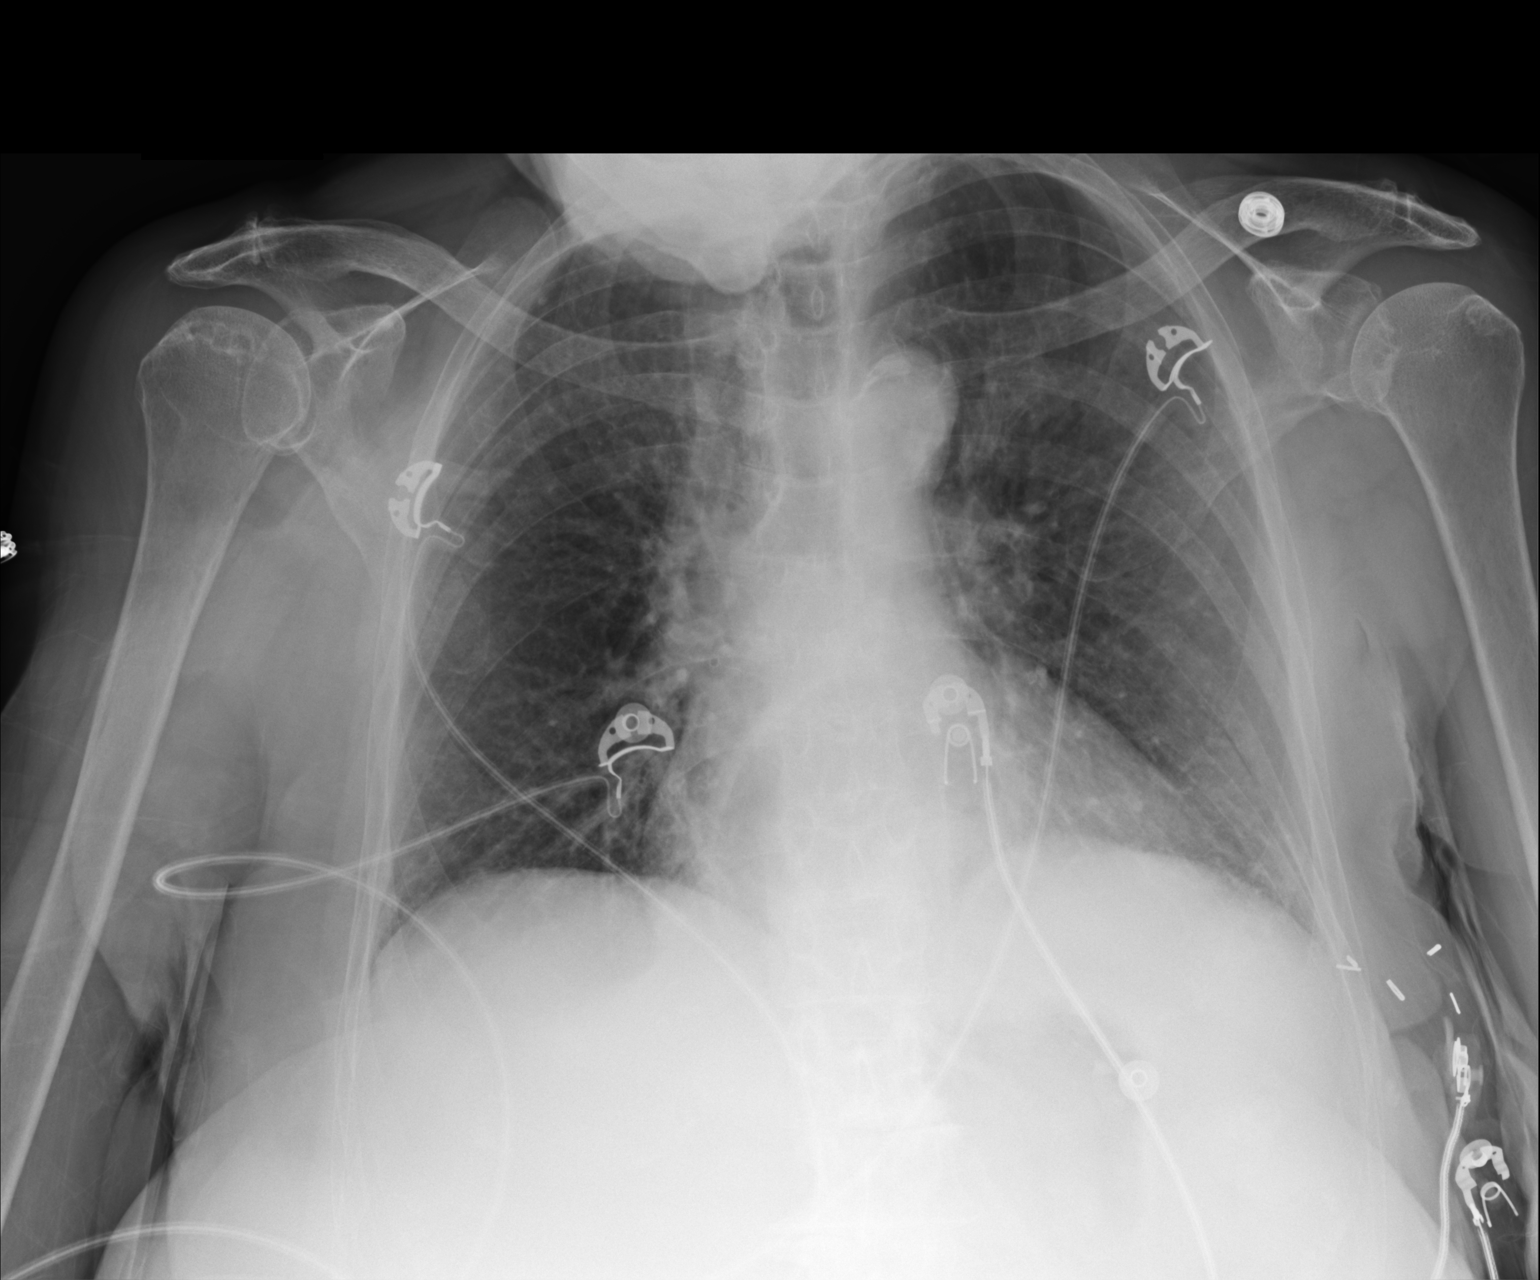

[1 of 1 positions shown; findings below may reference images not displayed]

FINDINGS: The heart size and mediastinal contours are within normal limits.
Both lungs are clear. Atherosclerosis of thoracic aorta is noted. No
pneumothorax or pleural effusion is noted. Stable hiatal hernia. The
visualized skeletal structures are unremarkable.
IMPRESSION: Aortic atherosclerosis. Stable hiatal hernia. No acute intracranial
abnormality seen.

## 2017-06-07 DIAGNOSIS — I10 Essential (primary) hypertension: Secondary | ICD-10-CM | POA: Diagnosis not present

## 2017-06-08 DIAGNOSIS — I10 Essential (primary) hypertension: Secondary | ICD-10-CM | POA: Diagnosis not present

## 2017-06-09 DIAGNOSIS — I10 Essential (primary) hypertension: Secondary | ICD-10-CM | POA: Diagnosis not present

## 2017-06-10 DIAGNOSIS — I10 Essential (primary) hypertension: Secondary | ICD-10-CM | POA: Diagnosis not present

## 2017-06-11 DIAGNOSIS — I10 Essential (primary) hypertension: Secondary | ICD-10-CM | POA: Diagnosis not present

## 2017-06-12 DIAGNOSIS — I10 Essential (primary) hypertension: Secondary | ICD-10-CM | POA: Diagnosis not present

## 2017-06-13 DIAGNOSIS — I10 Essential (primary) hypertension: Secondary | ICD-10-CM | POA: Diagnosis not present

## 2017-06-14 DIAGNOSIS — N390 Urinary tract infection, site not specified: Secondary | ICD-10-CM | POA: Diagnosis not present

## 2017-06-14 DIAGNOSIS — R319 Hematuria, unspecified: Secondary | ICD-10-CM | POA: Diagnosis not present

## 2017-06-14 DIAGNOSIS — R69 Illness, unspecified: Secondary | ICD-10-CM | POA: Diagnosis not present

## 2017-06-14 DIAGNOSIS — I10 Essential (primary) hypertension: Secondary | ICD-10-CM | POA: Diagnosis not present

## 2017-06-14 DIAGNOSIS — E871 Hypo-osmolality and hyponatremia: Secondary | ICD-10-CM | POA: Diagnosis not present

## 2017-06-14 DIAGNOSIS — Z79899 Other long term (current) drug therapy: Secondary | ICD-10-CM | POA: Diagnosis not present

## 2017-06-15 DIAGNOSIS — I10 Essential (primary) hypertension: Secondary | ICD-10-CM | POA: Diagnosis not present

## 2017-06-16 DIAGNOSIS — I10 Essential (primary) hypertension: Secondary | ICD-10-CM | POA: Diagnosis not present

## 2017-06-17 DIAGNOSIS — I10 Essential (primary) hypertension: Secondary | ICD-10-CM | POA: Diagnosis not present

## 2017-06-18 DIAGNOSIS — I10 Essential (primary) hypertension: Secondary | ICD-10-CM | POA: Diagnosis not present

## 2017-06-19 DIAGNOSIS — I10 Essential (primary) hypertension: Secondary | ICD-10-CM | POA: Diagnosis not present

## 2017-06-20 DIAGNOSIS — I10 Essential (primary) hypertension: Secondary | ICD-10-CM | POA: Diagnosis not present

## 2017-06-21 DIAGNOSIS — I10 Essential (primary) hypertension: Secondary | ICD-10-CM | POA: Diagnosis not present

## 2017-06-22 DIAGNOSIS — H6123 Impacted cerumen, bilateral: Secondary | ICD-10-CM | POA: Diagnosis not present

## 2017-06-22 DIAGNOSIS — I10 Essential (primary) hypertension: Secondary | ICD-10-CM | POA: Diagnosis not present

## 2017-06-22 DIAGNOSIS — H9222 Otorrhagia, left ear: Secondary | ICD-10-CM | POA: Diagnosis not present

## 2017-06-23 DIAGNOSIS — I10 Essential (primary) hypertension: Secondary | ICD-10-CM | POA: Diagnosis not present

## 2017-06-24 DIAGNOSIS — I10 Essential (primary) hypertension: Secondary | ICD-10-CM | POA: Diagnosis not present

## 2017-06-25 DIAGNOSIS — I10 Essential (primary) hypertension: Secondary | ICD-10-CM | POA: Diagnosis not present

## 2017-06-26 DIAGNOSIS — I10 Essential (primary) hypertension: Secondary | ICD-10-CM | POA: Diagnosis not present

## 2017-06-27 DIAGNOSIS — I10 Essential (primary) hypertension: Secondary | ICD-10-CM | POA: Diagnosis not present

## 2017-06-28 DIAGNOSIS — I10 Essential (primary) hypertension: Secondary | ICD-10-CM | POA: Diagnosis not present

## 2017-06-29 DIAGNOSIS — I10 Essential (primary) hypertension: Secondary | ICD-10-CM | POA: Diagnosis not present

## 2017-06-30 DIAGNOSIS — I10 Essential (primary) hypertension: Secondary | ICD-10-CM | POA: Diagnosis not present

## 2017-06-30 IMAGING — CR DG CHEST 1V PORT
1 series · 1 of 1 positions shown · non-contrast
Comparison: 06/13/2016 chest radiograph.

CLINICAL DATA: 89 y/o  F; chest pain.

EXAM:
PORTABLE CHEST 1 VIEW

[AP]
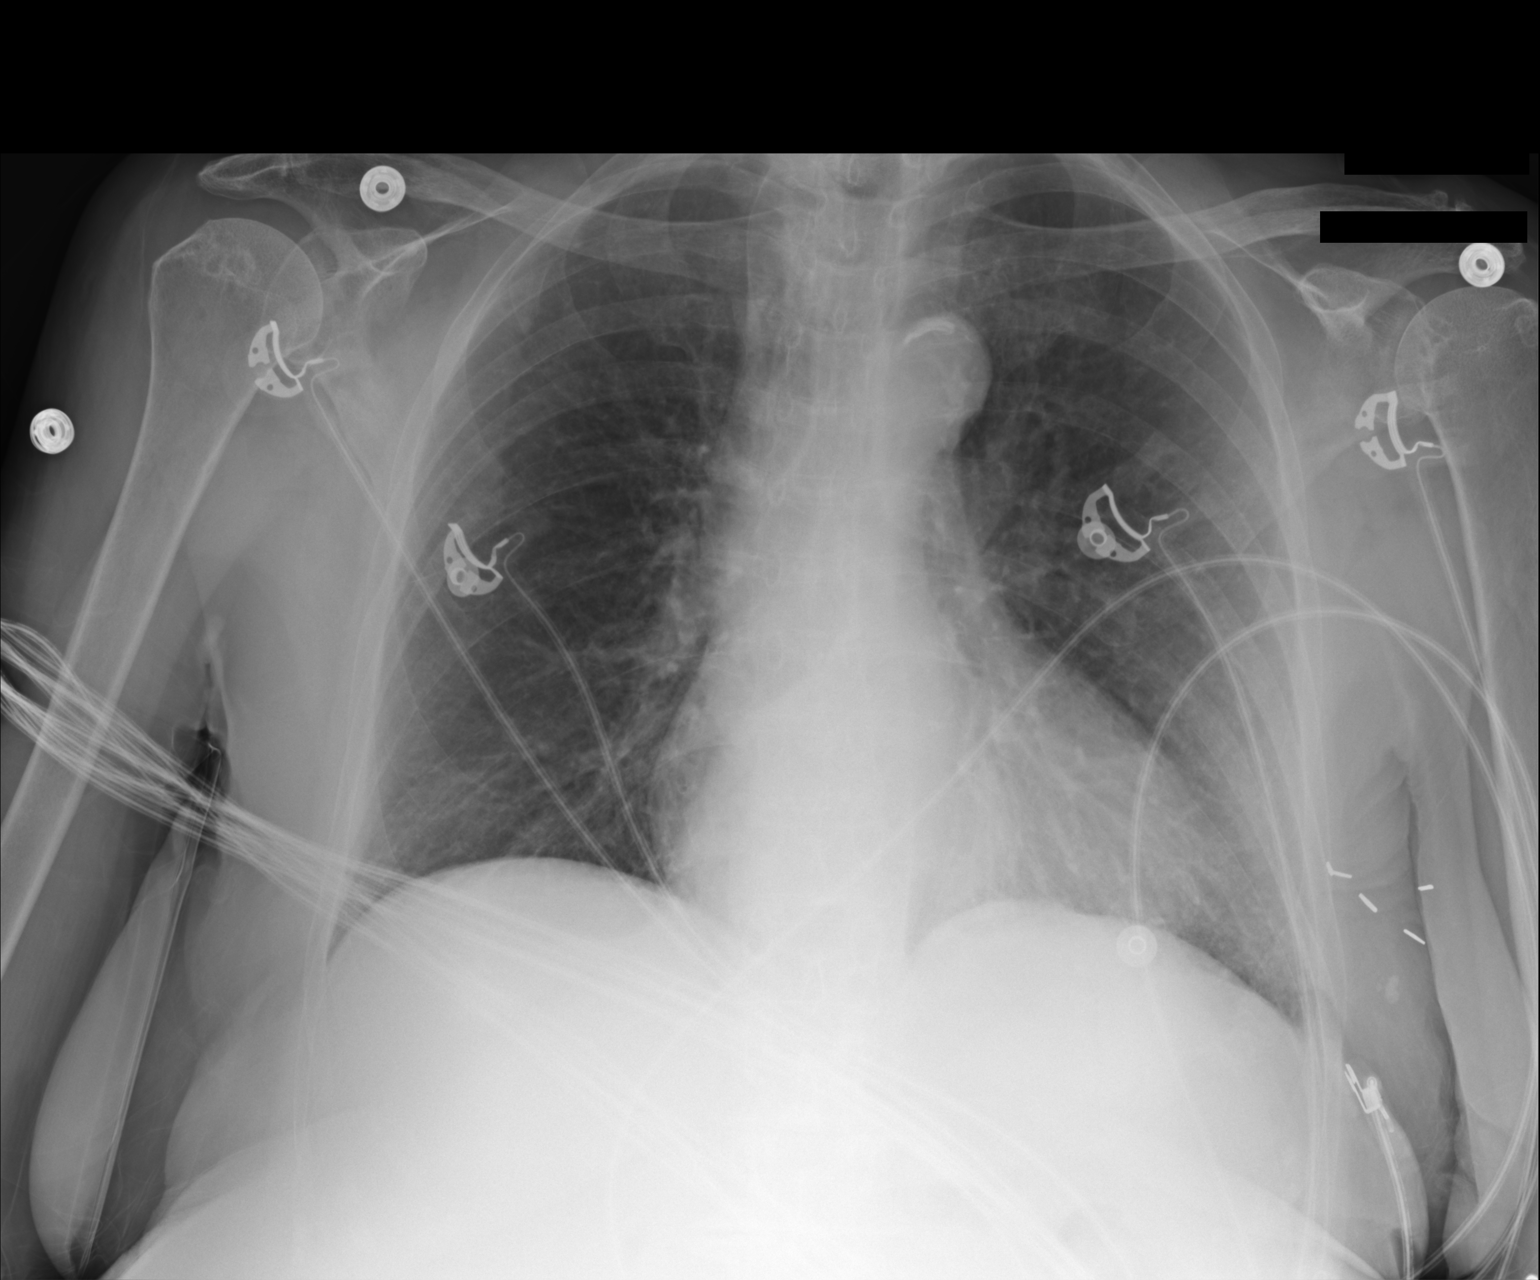

[1 of 1 positions shown; findings below may reference images not displayed]

FINDINGS: Stable normal cardiac silhouette. Large hiatal hernia. Aortic
atherosclerosis with calcification. Clear lungs. No pleural effusion
or pneumothorax. Bones are unremarkable.
IMPRESSION: No active disease.  Large hiatal hernia.  Aortic atherosclerosis.

By: Chuyita Bustillo M.D.

## 2017-07-01 DIAGNOSIS — I10 Essential (primary) hypertension: Secondary | ICD-10-CM | POA: Diagnosis not present

## 2017-07-02 DIAGNOSIS — I10 Essential (primary) hypertension: Secondary | ICD-10-CM | POA: Diagnosis not present

## 2017-07-03 DIAGNOSIS — I10 Essential (primary) hypertension: Secondary | ICD-10-CM | POA: Diagnosis not present

## 2017-07-04 DIAGNOSIS — I10 Essential (primary) hypertension: Secondary | ICD-10-CM | POA: Diagnosis not present

## 2017-07-05 DIAGNOSIS — I10 Essential (primary) hypertension: Secondary | ICD-10-CM | POA: Diagnosis not present

## 2017-07-06 DIAGNOSIS — I10 Essential (primary) hypertension: Secondary | ICD-10-CM | POA: Diagnosis not present

## 2017-07-07 DIAGNOSIS — I10 Essential (primary) hypertension: Secondary | ICD-10-CM | POA: Diagnosis not present

## 2017-07-08 DIAGNOSIS — I10 Essential (primary) hypertension: Secondary | ICD-10-CM | POA: Diagnosis not present

## 2017-07-09 DIAGNOSIS — I10 Essential (primary) hypertension: Secondary | ICD-10-CM | POA: Diagnosis not present

## 2017-07-10 DIAGNOSIS — I10 Essential (primary) hypertension: Secondary | ICD-10-CM | POA: Diagnosis not present

## 2017-07-11 DIAGNOSIS — I10 Essential (primary) hypertension: Secondary | ICD-10-CM | POA: Diagnosis not present

## 2017-07-12 DIAGNOSIS — I10 Essential (primary) hypertension: Secondary | ICD-10-CM | POA: Diagnosis not present

## 2017-07-13 DIAGNOSIS — I10 Essential (primary) hypertension: Secondary | ICD-10-CM | POA: Diagnosis not present

## 2017-07-14 DIAGNOSIS — I10 Essential (primary) hypertension: Secondary | ICD-10-CM | POA: Diagnosis not present

## 2017-07-15 DIAGNOSIS — I10 Essential (primary) hypertension: Secondary | ICD-10-CM | POA: Diagnosis not present

## 2017-07-16 DIAGNOSIS — I10 Essential (primary) hypertension: Secondary | ICD-10-CM | POA: Diagnosis not present

## 2017-07-17 DIAGNOSIS — I10 Essential (primary) hypertension: Secondary | ICD-10-CM | POA: Diagnosis not present

## 2017-07-18 DIAGNOSIS — I10 Essential (primary) hypertension: Secondary | ICD-10-CM | POA: Diagnosis not present

## 2017-07-19 DIAGNOSIS — I10 Essential (primary) hypertension: Secondary | ICD-10-CM | POA: Diagnosis not present

## 2017-07-20 DIAGNOSIS — I10 Essential (primary) hypertension: Secondary | ICD-10-CM | POA: Diagnosis not present

## 2017-07-21 DIAGNOSIS — I10 Essential (primary) hypertension: Secondary | ICD-10-CM | POA: Diagnosis not present

## 2017-07-22 DIAGNOSIS — I10 Essential (primary) hypertension: Secondary | ICD-10-CM | POA: Diagnosis not present

## 2017-07-23 DIAGNOSIS — I10 Essential (primary) hypertension: Secondary | ICD-10-CM | POA: Diagnosis not present

## 2017-07-24 DIAGNOSIS — I10 Essential (primary) hypertension: Secondary | ICD-10-CM | POA: Diagnosis not present

## 2017-07-25 DIAGNOSIS — I10 Essential (primary) hypertension: Secondary | ICD-10-CM | POA: Diagnosis not present

## 2017-07-26 DIAGNOSIS — I10 Essential (primary) hypertension: Secondary | ICD-10-CM | POA: Diagnosis not present

## 2017-07-27 DIAGNOSIS — I10 Essential (primary) hypertension: Secondary | ICD-10-CM | POA: Diagnosis not present

## 2017-07-28 DIAGNOSIS — I10 Essential (primary) hypertension: Secondary | ICD-10-CM | POA: Diagnosis not present

## 2017-07-29 DIAGNOSIS — I10 Essential (primary) hypertension: Secondary | ICD-10-CM | POA: Diagnosis not present

## 2017-07-30 DIAGNOSIS — I10 Essential (primary) hypertension: Secondary | ICD-10-CM | POA: Diagnosis not present

## 2017-07-31 DIAGNOSIS — I10 Essential (primary) hypertension: Secondary | ICD-10-CM | POA: Diagnosis not present

## 2017-08-01 DIAGNOSIS — I10 Essential (primary) hypertension: Secondary | ICD-10-CM | POA: Diagnosis not present

## 2017-08-02 DIAGNOSIS — I10 Essential (primary) hypertension: Secondary | ICD-10-CM | POA: Diagnosis not present

## 2017-08-03 DIAGNOSIS — I10 Essential (primary) hypertension: Secondary | ICD-10-CM | POA: Diagnosis not present

## 2017-08-04 DIAGNOSIS — I10 Essential (primary) hypertension: Secondary | ICD-10-CM | POA: Diagnosis not present

## 2017-08-05 DIAGNOSIS — I10 Essential (primary) hypertension: Secondary | ICD-10-CM | POA: Diagnosis not present

## 2017-08-06 DIAGNOSIS — I10 Essential (primary) hypertension: Secondary | ICD-10-CM | POA: Diagnosis not present

## 2017-08-07 DIAGNOSIS — I10 Essential (primary) hypertension: Secondary | ICD-10-CM | POA: Diagnosis not present

## 2017-08-08 DIAGNOSIS — I10 Essential (primary) hypertension: Secondary | ICD-10-CM | POA: Diagnosis not present

## 2017-08-09 DIAGNOSIS — I10 Essential (primary) hypertension: Secondary | ICD-10-CM | POA: Diagnosis not present

## 2017-08-10 DIAGNOSIS — I10 Essential (primary) hypertension: Secondary | ICD-10-CM | POA: Diagnosis not present

## 2017-08-11 DIAGNOSIS — I10 Essential (primary) hypertension: Secondary | ICD-10-CM | POA: Diagnosis not present

## 2017-08-12 DIAGNOSIS — I10 Essential (primary) hypertension: Secondary | ICD-10-CM | POA: Diagnosis not present

## 2017-08-13 DIAGNOSIS — I10 Essential (primary) hypertension: Secondary | ICD-10-CM | POA: Diagnosis not present

## 2017-08-14 DIAGNOSIS — I10 Essential (primary) hypertension: Secondary | ICD-10-CM | POA: Diagnosis not present

## 2017-08-15 DIAGNOSIS — I10 Essential (primary) hypertension: Secondary | ICD-10-CM | POA: Diagnosis not present

## 2017-08-16 DIAGNOSIS — I10 Essential (primary) hypertension: Secondary | ICD-10-CM | POA: Diagnosis not present

## 2017-08-17 DIAGNOSIS — I10 Essential (primary) hypertension: Secondary | ICD-10-CM | POA: Diagnosis not present

## 2017-08-18 DIAGNOSIS — I10 Essential (primary) hypertension: Secondary | ICD-10-CM | POA: Diagnosis not present

## 2017-08-19 DIAGNOSIS — I10 Essential (primary) hypertension: Secondary | ICD-10-CM | POA: Diagnosis not present

## 2017-08-20 DIAGNOSIS — I10 Essential (primary) hypertension: Secondary | ICD-10-CM | POA: Diagnosis not present

## 2017-08-21 DIAGNOSIS — R262 Difficulty in walking, not elsewhere classified: Secondary | ICD-10-CM | POA: Diagnosis not present

## 2017-08-21 DIAGNOSIS — D500 Iron deficiency anemia secondary to blood loss (chronic): Secondary | ICD-10-CM | POA: Diagnosis not present

## 2017-08-21 DIAGNOSIS — M170 Bilateral primary osteoarthritis of knee: Secondary | ICD-10-CM | POA: Diagnosis not present

## 2017-08-21 DIAGNOSIS — I5030 Unspecified diastolic (congestive) heart failure: Secondary | ICD-10-CM | POA: Diagnosis not present

## 2017-08-21 DIAGNOSIS — M6281 Muscle weakness (generalized): Secondary | ICD-10-CM | POA: Diagnosis not present

## 2017-08-21 DIAGNOSIS — I10 Essential (primary) hypertension: Secondary | ICD-10-CM | POA: Diagnosis not present

## 2017-08-22 DIAGNOSIS — I10 Essential (primary) hypertension: Secondary | ICD-10-CM | POA: Diagnosis not present

## 2017-08-23 DIAGNOSIS — I10 Essential (primary) hypertension: Secondary | ICD-10-CM | POA: Diagnosis not present

## 2017-08-24 DIAGNOSIS — I10 Essential (primary) hypertension: Secondary | ICD-10-CM | POA: Diagnosis not present

## 2017-08-25 DIAGNOSIS — I10 Essential (primary) hypertension: Secondary | ICD-10-CM | POA: Diagnosis not present

## 2017-08-26 DIAGNOSIS — I10 Essential (primary) hypertension: Secondary | ICD-10-CM | POA: Diagnosis not present

## 2017-08-27 DIAGNOSIS — I10 Essential (primary) hypertension: Secondary | ICD-10-CM | POA: Diagnosis not present

## 2017-08-28 DIAGNOSIS — I10 Essential (primary) hypertension: Secondary | ICD-10-CM | POA: Diagnosis not present

## 2017-08-29 DIAGNOSIS — I10 Essential (primary) hypertension: Secondary | ICD-10-CM | POA: Diagnosis not present

## 2017-08-30 DIAGNOSIS — I10 Essential (primary) hypertension: Secondary | ICD-10-CM | POA: Diagnosis not present

## 2017-08-31 DIAGNOSIS — I10 Essential (primary) hypertension: Secondary | ICD-10-CM | POA: Diagnosis not present

## 2017-09-01 DIAGNOSIS — I10 Essential (primary) hypertension: Secondary | ICD-10-CM | POA: Diagnosis not present

## 2017-09-02 DIAGNOSIS — I10 Essential (primary) hypertension: Secondary | ICD-10-CM | POA: Diagnosis not present

## 2017-09-03 DIAGNOSIS — I10 Essential (primary) hypertension: Secondary | ICD-10-CM | POA: Diagnosis not present

## 2017-09-04 DIAGNOSIS — I10 Essential (primary) hypertension: Secondary | ICD-10-CM | POA: Diagnosis not present

## 2017-09-05 DIAGNOSIS — I10 Essential (primary) hypertension: Secondary | ICD-10-CM | POA: Diagnosis not present

## 2017-09-06 DIAGNOSIS — I10 Essential (primary) hypertension: Secondary | ICD-10-CM | POA: Diagnosis not present

## 2017-09-07 DIAGNOSIS — I10 Essential (primary) hypertension: Secondary | ICD-10-CM | POA: Diagnosis not present

## 2017-09-08 DIAGNOSIS — I10 Essential (primary) hypertension: Secondary | ICD-10-CM | POA: Diagnosis not present

## 2017-09-09 DIAGNOSIS — I10 Essential (primary) hypertension: Secondary | ICD-10-CM | POA: Diagnosis not present

## 2017-09-10 DIAGNOSIS — I10 Essential (primary) hypertension: Secondary | ICD-10-CM | POA: Diagnosis not present

## 2017-09-11 DIAGNOSIS — I10 Essential (primary) hypertension: Secondary | ICD-10-CM | POA: Diagnosis not present

## 2017-09-12 DIAGNOSIS — I10 Essential (primary) hypertension: Secondary | ICD-10-CM | POA: Diagnosis not present

## 2017-09-13 DIAGNOSIS — I10 Essential (primary) hypertension: Secondary | ICD-10-CM | POA: Diagnosis not present

## 2017-09-14 DIAGNOSIS — I10 Essential (primary) hypertension: Secondary | ICD-10-CM | POA: Diagnosis not present

## 2017-09-15 DIAGNOSIS — I10 Essential (primary) hypertension: Secondary | ICD-10-CM | POA: Diagnosis not present

## 2017-09-16 DIAGNOSIS — I10 Essential (primary) hypertension: Secondary | ICD-10-CM | POA: Diagnosis not present

## 2017-09-17 DIAGNOSIS — L299 Pruritus, unspecified: Secondary | ICD-10-CM | POA: Diagnosis not present

## 2017-09-17 DIAGNOSIS — I10 Essential (primary) hypertension: Secondary | ICD-10-CM | POA: Diagnosis not present

## 2017-09-17 DIAGNOSIS — L239 Allergic contact dermatitis, unspecified cause: Secondary | ICD-10-CM | POA: Diagnosis not present

## 2017-09-17 DIAGNOSIS — Z66 Do not resuscitate: Secondary | ICD-10-CM | POA: Diagnosis not present

## 2017-09-17 DIAGNOSIS — G301 Alzheimer's disease with late onset: Secondary | ICD-10-CM | POA: Diagnosis not present

## 2017-09-18 DIAGNOSIS — I10 Essential (primary) hypertension: Secondary | ICD-10-CM | POA: Diagnosis not present

## 2017-09-19 DIAGNOSIS — I10 Essential (primary) hypertension: Secondary | ICD-10-CM | POA: Diagnosis not present

## 2017-09-20 DIAGNOSIS — I10 Essential (primary) hypertension: Secondary | ICD-10-CM | POA: Diagnosis not present

## 2017-09-21 DIAGNOSIS — I10 Essential (primary) hypertension: Secondary | ICD-10-CM | POA: Diagnosis not present

## 2017-09-22 DIAGNOSIS — I10 Essential (primary) hypertension: Secondary | ICD-10-CM | POA: Diagnosis not present

## 2017-09-23 DIAGNOSIS — I10 Essential (primary) hypertension: Secondary | ICD-10-CM | POA: Diagnosis not present

## 2017-09-24 DIAGNOSIS — I10 Essential (primary) hypertension: Secondary | ICD-10-CM | POA: Diagnosis not present

## 2017-09-25 DIAGNOSIS — I10 Essential (primary) hypertension: Secondary | ICD-10-CM | POA: Diagnosis not present

## 2017-09-26 DIAGNOSIS — I10 Essential (primary) hypertension: Secondary | ICD-10-CM | POA: Diagnosis not present

## 2017-09-27 DIAGNOSIS — I10 Essential (primary) hypertension: Secondary | ICD-10-CM | POA: Diagnosis not present

## 2017-09-28 DIAGNOSIS — I10 Essential (primary) hypertension: Secondary | ICD-10-CM | POA: Diagnosis not present

## 2017-09-29 DIAGNOSIS — I10 Essential (primary) hypertension: Secondary | ICD-10-CM | POA: Diagnosis not present

## 2017-09-30 DIAGNOSIS — I10 Essential (primary) hypertension: Secondary | ICD-10-CM | POA: Diagnosis not present

## 2017-10-01 DIAGNOSIS — I10 Essential (primary) hypertension: Secondary | ICD-10-CM | POA: Diagnosis not present

## 2017-10-02 DIAGNOSIS — I10 Essential (primary) hypertension: Secondary | ICD-10-CM | POA: Diagnosis not present

## 2017-10-03 DIAGNOSIS — I10 Essential (primary) hypertension: Secondary | ICD-10-CM | POA: Diagnosis not present

## 2017-10-04 DIAGNOSIS — I10 Essential (primary) hypertension: Secondary | ICD-10-CM | POA: Diagnosis not present

## 2017-10-04 DIAGNOSIS — G301 Alzheimer's disease with late onset: Secondary | ICD-10-CM | POA: Diagnosis not present

## 2017-10-04 DIAGNOSIS — Z66 Do not resuscitate: Secondary | ICD-10-CM | POA: Diagnosis not present

## 2017-10-05 DIAGNOSIS — I10 Essential (primary) hypertension: Secondary | ICD-10-CM | POA: Diagnosis not present

## 2017-10-06 DIAGNOSIS — I10 Essential (primary) hypertension: Secondary | ICD-10-CM | POA: Diagnosis not present

## 2017-10-07 DIAGNOSIS — I10 Essential (primary) hypertension: Secondary | ICD-10-CM | POA: Diagnosis not present

## 2017-10-08 DIAGNOSIS — I10 Essential (primary) hypertension: Secondary | ICD-10-CM | POA: Diagnosis not present

## 2017-10-08 DIAGNOSIS — Z66 Do not resuscitate: Secondary | ICD-10-CM | POA: Diagnosis not present

## 2017-10-08 DIAGNOSIS — G301 Alzheimer's disease with late onset: Secondary | ICD-10-CM | POA: Diagnosis not present

## 2017-10-09 DIAGNOSIS — I10 Essential (primary) hypertension: Secondary | ICD-10-CM | POA: Diagnosis not present

## 2017-10-10 DIAGNOSIS — I10 Essential (primary) hypertension: Secondary | ICD-10-CM | POA: Diagnosis not present

## 2017-10-11 DIAGNOSIS — I10 Essential (primary) hypertension: Secondary | ICD-10-CM | POA: Diagnosis not present

## 2017-10-12 DIAGNOSIS — I10 Essential (primary) hypertension: Secondary | ICD-10-CM | POA: Diagnosis not present

## 2017-10-13 DIAGNOSIS — I10 Essential (primary) hypertension: Secondary | ICD-10-CM | POA: Diagnosis not present

## 2017-10-14 DIAGNOSIS — I10 Essential (primary) hypertension: Secondary | ICD-10-CM | POA: Diagnosis not present

## 2017-10-15 DIAGNOSIS — R600 Localized edema: Secondary | ICD-10-CM | POA: Diagnosis not present

## 2017-10-15 DIAGNOSIS — I10 Essential (primary) hypertension: Secondary | ICD-10-CM | POA: Diagnosis not present

## 2017-10-15 DIAGNOSIS — G301 Alzheimer's disease with late onset: Secondary | ICD-10-CM | POA: Diagnosis not present

## 2017-10-15 DIAGNOSIS — L239 Allergic contact dermatitis, unspecified cause: Secondary | ICD-10-CM | POA: Diagnosis not present

## 2017-10-15 DIAGNOSIS — Z66 Do not resuscitate: Secondary | ICD-10-CM | POA: Diagnosis not present

## 2017-10-16 DIAGNOSIS — I10 Essential (primary) hypertension: Secondary | ICD-10-CM | POA: Diagnosis not present

## 2017-10-17 DIAGNOSIS — I10 Essential (primary) hypertension: Secondary | ICD-10-CM | POA: Diagnosis not present

## 2017-10-18 DIAGNOSIS — I10 Essential (primary) hypertension: Secondary | ICD-10-CM | POA: Diagnosis not present

## 2017-10-19 DIAGNOSIS — I10 Essential (primary) hypertension: Secondary | ICD-10-CM | POA: Diagnosis not present

## 2017-10-20 DIAGNOSIS — I10 Essential (primary) hypertension: Secondary | ICD-10-CM | POA: Diagnosis not present

## 2017-10-21 DIAGNOSIS — I10 Essential (primary) hypertension: Secondary | ICD-10-CM | POA: Diagnosis not present

## 2017-10-22 DIAGNOSIS — B354 Tinea corporis: Secondary | ICD-10-CM | POA: Diagnosis not present

## 2017-10-22 DIAGNOSIS — L239 Allergic contact dermatitis, unspecified cause: Secondary | ICD-10-CM | POA: Diagnosis not present

## 2017-10-22 DIAGNOSIS — I10 Essential (primary) hypertension: Secondary | ICD-10-CM | POA: Diagnosis not present

## 2017-10-22 DIAGNOSIS — G301 Alzheimer's disease with late onset: Secondary | ICD-10-CM | POA: Diagnosis not present

## 2017-10-22 DIAGNOSIS — Z66 Do not resuscitate: Secondary | ICD-10-CM | POA: Diagnosis not present

## 2017-10-23 DIAGNOSIS — I10 Essential (primary) hypertension: Secondary | ICD-10-CM | POA: Diagnosis not present

## 2017-10-24 DIAGNOSIS — I10 Essential (primary) hypertension: Secondary | ICD-10-CM | POA: Diagnosis not present

## 2017-10-25 DIAGNOSIS — I10 Essential (primary) hypertension: Secondary | ICD-10-CM | POA: Diagnosis not present

## 2017-10-26 DIAGNOSIS — S60212D Contusion of left wrist, subsequent encounter: Secondary | ICD-10-CM | POA: Diagnosis not present

## 2017-10-26 DIAGNOSIS — I10 Essential (primary) hypertension: Secondary | ICD-10-CM | POA: Diagnosis not present

## 2017-10-26 DIAGNOSIS — I4891 Unspecified atrial fibrillation: Secondary | ICD-10-CM | POA: Diagnosis not present

## 2017-10-26 DIAGNOSIS — I951 Orthostatic hypotension: Secondary | ICD-10-CM | POA: Diagnosis not present

## 2017-10-26 DIAGNOSIS — Z87440 Personal history of urinary (tract) infections: Secondary | ICD-10-CM | POA: Diagnosis not present

## 2017-10-26 DIAGNOSIS — S91301D Unspecified open wound, right foot, subsequent encounter: Secondary | ICD-10-CM | POA: Diagnosis not present

## 2017-10-26 DIAGNOSIS — I5030 Unspecified diastolic (congestive) heart failure: Secondary | ICD-10-CM | POA: Diagnosis not present

## 2017-10-26 DIAGNOSIS — F0390 Unspecified dementia without behavioral disturbance: Secondary | ICD-10-CM | POA: Diagnosis not present

## 2017-10-26 DIAGNOSIS — M1990 Unspecified osteoarthritis, unspecified site: Secondary | ICD-10-CM | POA: Diagnosis not present

## 2017-10-26 DIAGNOSIS — Z7982 Long term (current) use of aspirin: Secondary | ICD-10-CM | POA: Diagnosis not present

## 2017-10-27 DIAGNOSIS — I10 Essential (primary) hypertension: Secondary | ICD-10-CM | POA: Diagnosis not present

## 2017-10-28 DIAGNOSIS — Z87440 Personal history of urinary (tract) infections: Secondary | ICD-10-CM | POA: Diagnosis not present

## 2017-10-28 DIAGNOSIS — Z7982 Long term (current) use of aspirin: Secondary | ICD-10-CM | POA: Diagnosis not present

## 2017-10-28 DIAGNOSIS — F0390 Unspecified dementia without behavioral disturbance: Secondary | ICD-10-CM | POA: Diagnosis not present

## 2017-10-28 DIAGNOSIS — I951 Orthostatic hypotension: Secondary | ICD-10-CM | POA: Diagnosis not present

## 2017-10-28 DIAGNOSIS — I4891 Unspecified atrial fibrillation: Secondary | ICD-10-CM | POA: Diagnosis not present

## 2017-10-28 DIAGNOSIS — I10 Essential (primary) hypertension: Secondary | ICD-10-CM | POA: Diagnosis not present

## 2017-10-28 DIAGNOSIS — I5030 Unspecified diastolic (congestive) heart failure: Secondary | ICD-10-CM | POA: Diagnosis not present

## 2017-10-28 DIAGNOSIS — S60212D Contusion of left wrist, subsequent encounter: Secondary | ICD-10-CM | POA: Diagnosis not present

## 2017-10-28 DIAGNOSIS — S91301D Unspecified open wound, right foot, subsequent encounter: Secondary | ICD-10-CM | POA: Diagnosis not present

## 2017-10-28 DIAGNOSIS — M1990 Unspecified osteoarthritis, unspecified site: Secondary | ICD-10-CM | POA: Diagnosis not present

## 2017-10-29 DIAGNOSIS — I10 Essential (primary) hypertension: Secondary | ICD-10-CM | POA: Diagnosis not present

## 2017-10-30 DIAGNOSIS — I4891 Unspecified atrial fibrillation: Secondary | ICD-10-CM | POA: Diagnosis not present

## 2017-10-30 DIAGNOSIS — Z87440 Personal history of urinary (tract) infections: Secondary | ICD-10-CM | POA: Diagnosis not present

## 2017-10-30 DIAGNOSIS — S91301D Unspecified open wound, right foot, subsequent encounter: Secondary | ICD-10-CM | POA: Diagnosis not present

## 2017-10-30 DIAGNOSIS — I10 Essential (primary) hypertension: Secondary | ICD-10-CM | POA: Diagnosis not present

## 2017-10-30 DIAGNOSIS — M1990 Unspecified osteoarthritis, unspecified site: Secondary | ICD-10-CM | POA: Diagnosis not present

## 2017-10-30 DIAGNOSIS — I951 Orthostatic hypotension: Secondary | ICD-10-CM | POA: Diagnosis not present

## 2017-10-30 DIAGNOSIS — F0390 Unspecified dementia without behavioral disturbance: Secondary | ICD-10-CM | POA: Diagnosis not present

## 2017-10-30 DIAGNOSIS — I5030 Unspecified diastolic (congestive) heart failure: Secondary | ICD-10-CM | POA: Diagnosis not present

## 2017-10-30 DIAGNOSIS — Z7982 Long term (current) use of aspirin: Secondary | ICD-10-CM | POA: Diagnosis not present

## 2017-10-30 DIAGNOSIS — S60212D Contusion of left wrist, subsequent encounter: Secondary | ICD-10-CM | POA: Diagnosis not present

## 2017-10-31 DIAGNOSIS — I10 Essential (primary) hypertension: Secondary | ICD-10-CM | POA: Diagnosis not present

## 2017-11-01 DIAGNOSIS — Z7982 Long term (current) use of aspirin: Secondary | ICD-10-CM | POA: Diagnosis not present

## 2017-11-01 DIAGNOSIS — S60212D Contusion of left wrist, subsequent encounter: Secondary | ICD-10-CM | POA: Diagnosis not present

## 2017-11-01 DIAGNOSIS — S91301D Unspecified open wound, right foot, subsequent encounter: Secondary | ICD-10-CM | POA: Diagnosis not present

## 2017-11-01 DIAGNOSIS — F0390 Unspecified dementia without behavioral disturbance: Secondary | ICD-10-CM | POA: Diagnosis not present

## 2017-11-01 DIAGNOSIS — I10 Essential (primary) hypertension: Secondary | ICD-10-CM | POA: Diagnosis not present

## 2017-11-01 DIAGNOSIS — I951 Orthostatic hypotension: Secondary | ICD-10-CM | POA: Diagnosis not present

## 2017-11-01 DIAGNOSIS — Z87440 Personal history of urinary (tract) infections: Secondary | ICD-10-CM | POA: Diagnosis not present

## 2017-11-01 DIAGNOSIS — I4891 Unspecified atrial fibrillation: Secondary | ICD-10-CM | POA: Diagnosis not present

## 2017-11-01 DIAGNOSIS — I5030 Unspecified diastolic (congestive) heart failure: Secondary | ICD-10-CM | POA: Diagnosis not present

## 2017-11-01 DIAGNOSIS — M1990 Unspecified osteoarthritis, unspecified site: Secondary | ICD-10-CM | POA: Diagnosis not present

## 2017-11-02 DIAGNOSIS — I4891 Unspecified atrial fibrillation: Secondary | ICD-10-CM | POA: Diagnosis not present

## 2017-11-02 DIAGNOSIS — I5030 Unspecified diastolic (congestive) heart failure: Secondary | ICD-10-CM | POA: Diagnosis not present

## 2017-11-02 DIAGNOSIS — S91301D Unspecified open wound, right foot, subsequent encounter: Secondary | ICD-10-CM | POA: Diagnosis not present

## 2017-11-02 DIAGNOSIS — Z87440 Personal history of urinary (tract) infections: Secondary | ICD-10-CM | POA: Diagnosis not present

## 2017-11-02 DIAGNOSIS — I951 Orthostatic hypotension: Secondary | ICD-10-CM | POA: Diagnosis not present

## 2017-11-02 DIAGNOSIS — M1990 Unspecified osteoarthritis, unspecified site: Secondary | ICD-10-CM | POA: Diagnosis not present

## 2017-11-02 DIAGNOSIS — S60212D Contusion of left wrist, subsequent encounter: Secondary | ICD-10-CM | POA: Diagnosis not present

## 2017-11-02 DIAGNOSIS — Z7982 Long term (current) use of aspirin: Secondary | ICD-10-CM | POA: Diagnosis not present

## 2017-11-02 DIAGNOSIS — F0390 Unspecified dementia without behavioral disturbance: Secondary | ICD-10-CM | POA: Diagnosis not present

## 2017-11-02 DIAGNOSIS — I10 Essential (primary) hypertension: Secondary | ICD-10-CM | POA: Diagnosis not present

## 2017-11-03 DIAGNOSIS — I10 Essential (primary) hypertension: Secondary | ICD-10-CM | POA: Diagnosis not present

## 2017-11-04 DIAGNOSIS — I10 Essential (primary) hypertension: Secondary | ICD-10-CM | POA: Diagnosis not present

## 2017-11-04 IMAGING — DX DG CHEST 2V
2 series · 2 of 2 positions shown · non-contrast
Comparison: 09/11/2016

CLINICAL DATA: Chest pain with increased heart rate.  SVT.

EXAM:
CHEST  2 VIEW

[chest lat]
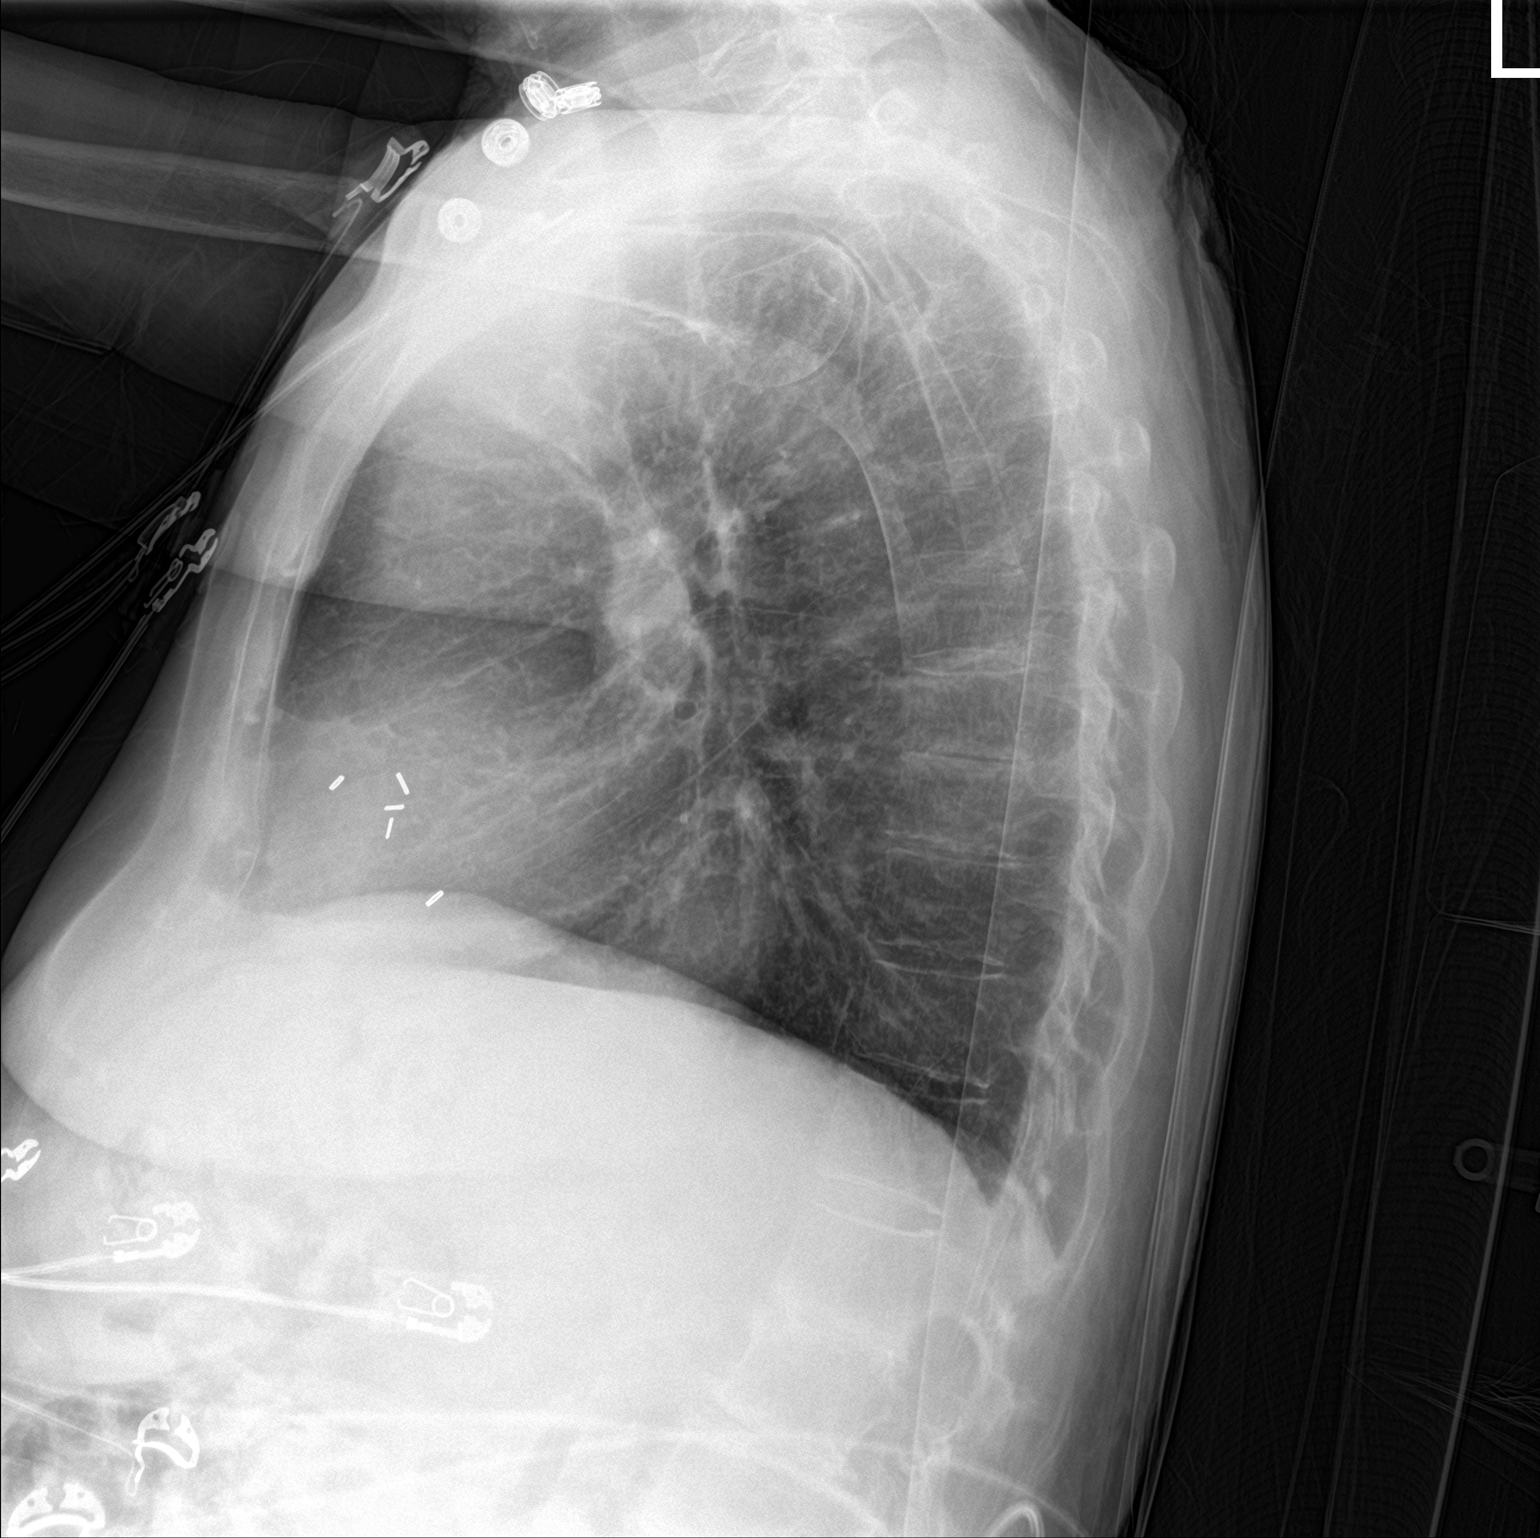

[chest ap]
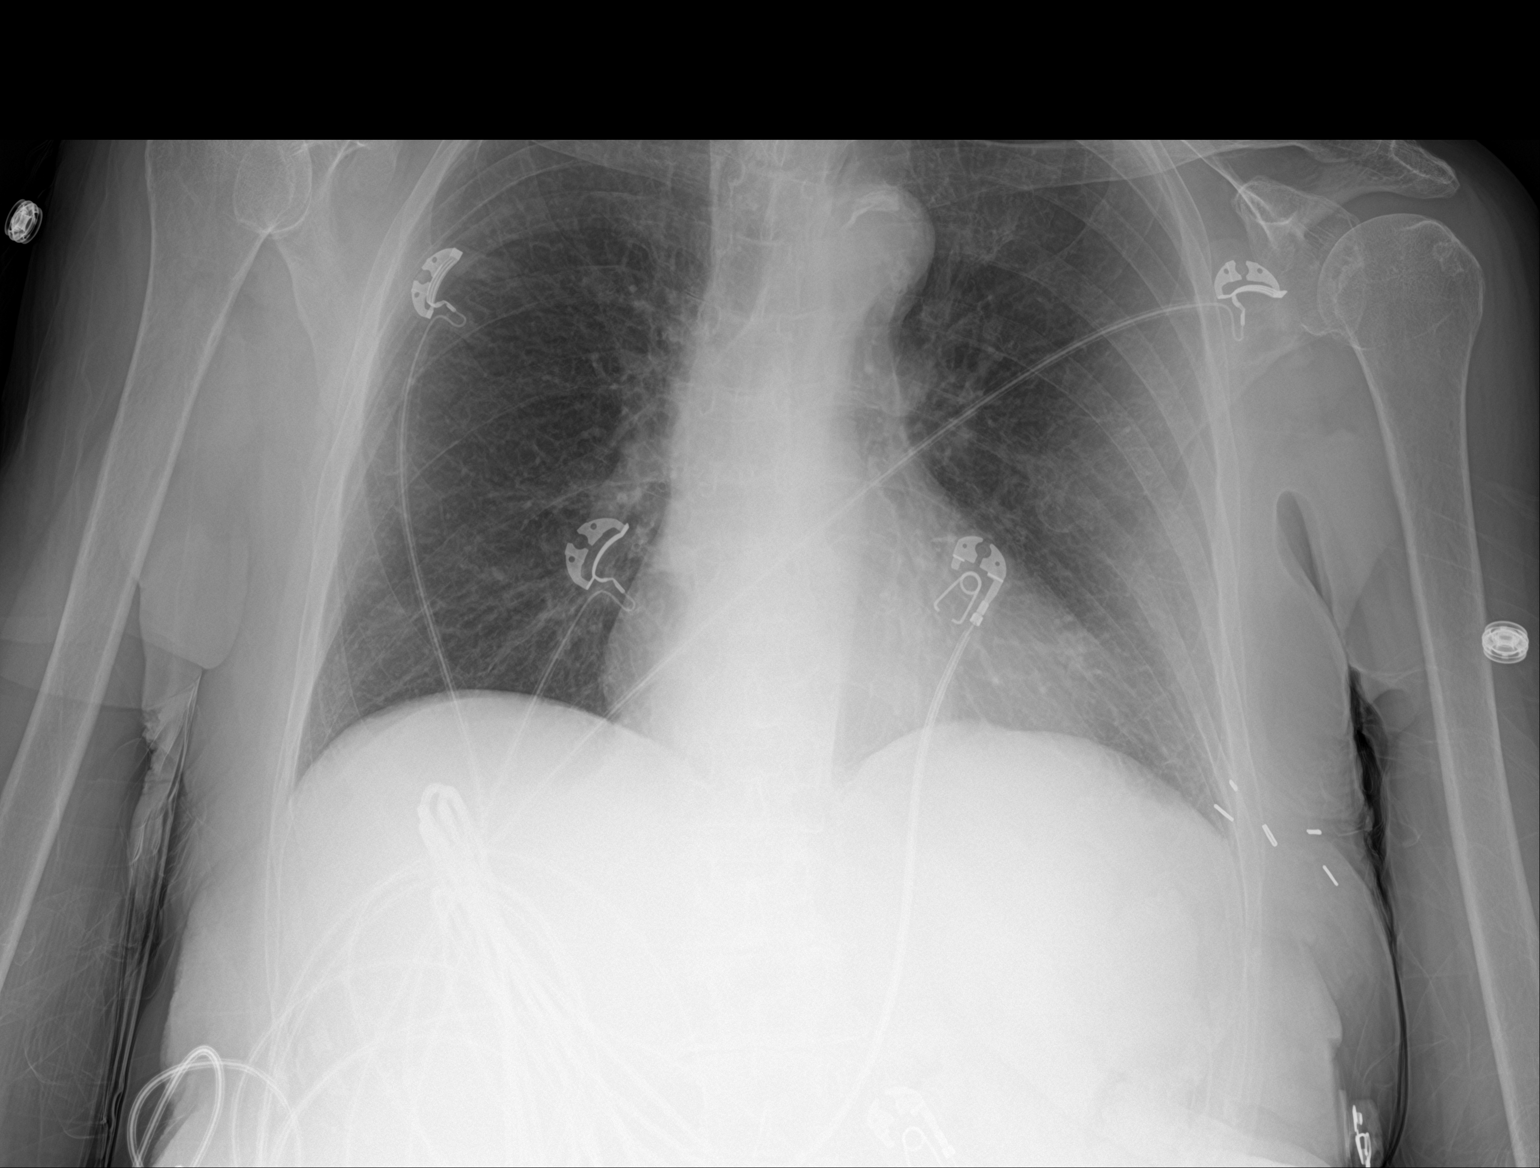

[2 of 2 positions shown; findings below may reference images not displayed]

FINDINGS: Normal heart size and pulmonary vascularity. No focal airspace
disease or consolidation in the lungs. No blunting of costophrenic
angles. No pneumothorax. Mediastinal contours appear intact. Azygos
lobe. Calcification of the aorta. Degenerative changes in the spine
and shoulders. Surgical clips in the left breast.
IMPRESSION: No active cardiopulmonary disease.

## 2017-11-05 DIAGNOSIS — I10 Essential (primary) hypertension: Secondary | ICD-10-CM | POA: Diagnosis not present

## 2017-11-06 DIAGNOSIS — I4891 Unspecified atrial fibrillation: Secondary | ICD-10-CM | POA: Diagnosis not present

## 2017-11-06 DIAGNOSIS — S91301D Unspecified open wound, right foot, subsequent encounter: Secondary | ICD-10-CM | POA: Diagnosis not present

## 2017-11-06 DIAGNOSIS — Z87440 Personal history of urinary (tract) infections: Secondary | ICD-10-CM | POA: Diagnosis not present

## 2017-11-06 DIAGNOSIS — F0390 Unspecified dementia without behavioral disturbance: Secondary | ICD-10-CM | POA: Diagnosis not present

## 2017-11-06 DIAGNOSIS — Z7982 Long term (current) use of aspirin: Secondary | ICD-10-CM | POA: Diagnosis not present

## 2017-11-06 DIAGNOSIS — I951 Orthostatic hypotension: Secondary | ICD-10-CM | POA: Diagnosis not present

## 2017-11-06 DIAGNOSIS — M1990 Unspecified osteoarthritis, unspecified site: Secondary | ICD-10-CM | POA: Diagnosis not present

## 2017-11-06 DIAGNOSIS — I10 Essential (primary) hypertension: Secondary | ICD-10-CM | POA: Diagnosis not present

## 2017-11-06 DIAGNOSIS — S60212D Contusion of left wrist, subsequent encounter: Secondary | ICD-10-CM | POA: Diagnosis not present

## 2017-11-06 DIAGNOSIS — I5030 Unspecified diastolic (congestive) heart failure: Secondary | ICD-10-CM | POA: Diagnosis not present

## 2017-11-07 DIAGNOSIS — S60212D Contusion of left wrist, subsequent encounter: Secondary | ICD-10-CM | POA: Diagnosis not present

## 2017-11-07 DIAGNOSIS — Z87440 Personal history of urinary (tract) infections: Secondary | ICD-10-CM | POA: Diagnosis not present

## 2017-11-07 DIAGNOSIS — I951 Orthostatic hypotension: Secondary | ICD-10-CM | POA: Diagnosis not present

## 2017-11-07 DIAGNOSIS — Z7982 Long term (current) use of aspirin: Secondary | ICD-10-CM | POA: Diagnosis not present

## 2017-11-07 DIAGNOSIS — S91301D Unspecified open wound, right foot, subsequent encounter: Secondary | ICD-10-CM | POA: Diagnosis not present

## 2017-11-07 DIAGNOSIS — M1990 Unspecified osteoarthritis, unspecified site: Secondary | ICD-10-CM | POA: Diagnosis not present

## 2017-11-07 DIAGNOSIS — I4891 Unspecified atrial fibrillation: Secondary | ICD-10-CM | POA: Diagnosis not present

## 2017-11-07 DIAGNOSIS — I5030 Unspecified diastolic (congestive) heart failure: Secondary | ICD-10-CM | POA: Diagnosis not present

## 2017-11-07 DIAGNOSIS — I10 Essential (primary) hypertension: Secondary | ICD-10-CM | POA: Diagnosis not present

## 2017-11-07 DIAGNOSIS — F0390 Unspecified dementia without behavioral disturbance: Secondary | ICD-10-CM | POA: Diagnosis not present

## 2017-11-08 DIAGNOSIS — S91301D Unspecified open wound, right foot, subsequent encounter: Secondary | ICD-10-CM | POA: Diagnosis not present

## 2017-11-08 DIAGNOSIS — F0390 Unspecified dementia without behavioral disturbance: Secondary | ICD-10-CM | POA: Diagnosis not present

## 2017-11-08 DIAGNOSIS — I5030 Unspecified diastolic (congestive) heart failure: Secondary | ICD-10-CM | POA: Diagnosis not present

## 2017-11-08 DIAGNOSIS — Z7982 Long term (current) use of aspirin: Secondary | ICD-10-CM | POA: Diagnosis not present

## 2017-11-08 DIAGNOSIS — I4891 Unspecified atrial fibrillation: Secondary | ICD-10-CM | POA: Diagnosis not present

## 2017-11-08 DIAGNOSIS — I10 Essential (primary) hypertension: Secondary | ICD-10-CM | POA: Diagnosis not present

## 2017-11-08 DIAGNOSIS — I951 Orthostatic hypotension: Secondary | ICD-10-CM | POA: Diagnosis not present

## 2017-11-08 DIAGNOSIS — S60212D Contusion of left wrist, subsequent encounter: Secondary | ICD-10-CM | POA: Diagnosis not present

## 2017-11-08 DIAGNOSIS — Z87440 Personal history of urinary (tract) infections: Secondary | ICD-10-CM | POA: Diagnosis not present

## 2017-11-08 DIAGNOSIS — M1990 Unspecified osteoarthritis, unspecified site: Secondary | ICD-10-CM | POA: Diagnosis not present

## 2017-11-09 DIAGNOSIS — I10 Essential (primary) hypertension: Secondary | ICD-10-CM | POA: Diagnosis not present

## 2017-11-10 DIAGNOSIS — I10 Essential (primary) hypertension: Secondary | ICD-10-CM | POA: Diagnosis not present

## 2017-11-11 DIAGNOSIS — I10 Essential (primary) hypertension: Secondary | ICD-10-CM | POA: Diagnosis not present

## 2017-11-12 DIAGNOSIS — I10 Essential (primary) hypertension: Secondary | ICD-10-CM | POA: Diagnosis not present

## 2017-11-13 DIAGNOSIS — I10 Essential (primary) hypertension: Secondary | ICD-10-CM | POA: Diagnosis not present

## 2017-11-14 DIAGNOSIS — I10 Essential (primary) hypertension: Secondary | ICD-10-CM | POA: Diagnosis not present

## 2017-11-15 DIAGNOSIS — F0390 Unspecified dementia without behavioral disturbance: Secondary | ICD-10-CM | POA: Diagnosis not present

## 2017-11-15 DIAGNOSIS — S60212D Contusion of left wrist, subsequent encounter: Secondary | ICD-10-CM | POA: Diagnosis not present

## 2017-11-15 DIAGNOSIS — I10 Essential (primary) hypertension: Secondary | ICD-10-CM | POA: Diagnosis not present

## 2017-11-15 DIAGNOSIS — I951 Orthostatic hypotension: Secondary | ICD-10-CM | POA: Diagnosis not present

## 2017-11-15 DIAGNOSIS — Z7982 Long term (current) use of aspirin: Secondary | ICD-10-CM | POA: Diagnosis not present

## 2017-11-15 DIAGNOSIS — Z87440 Personal history of urinary (tract) infections: Secondary | ICD-10-CM | POA: Diagnosis not present

## 2017-11-15 DIAGNOSIS — S91301D Unspecified open wound, right foot, subsequent encounter: Secondary | ICD-10-CM | POA: Diagnosis not present

## 2017-11-15 DIAGNOSIS — I4891 Unspecified atrial fibrillation: Secondary | ICD-10-CM | POA: Diagnosis not present

## 2017-11-15 DIAGNOSIS — M1990 Unspecified osteoarthritis, unspecified site: Secondary | ICD-10-CM | POA: Diagnosis not present

## 2017-11-15 DIAGNOSIS — I5030 Unspecified diastolic (congestive) heart failure: Secondary | ICD-10-CM | POA: Diagnosis not present

## 2017-11-16 DIAGNOSIS — S60212D Contusion of left wrist, subsequent encounter: Secondary | ICD-10-CM | POA: Diagnosis not present

## 2017-11-16 DIAGNOSIS — F0390 Unspecified dementia without behavioral disturbance: Secondary | ICD-10-CM | POA: Diagnosis not present

## 2017-11-16 DIAGNOSIS — I10 Essential (primary) hypertension: Secondary | ICD-10-CM | POA: Diagnosis not present

## 2017-11-16 DIAGNOSIS — I4891 Unspecified atrial fibrillation: Secondary | ICD-10-CM | POA: Diagnosis not present

## 2017-11-16 DIAGNOSIS — I5030 Unspecified diastolic (congestive) heart failure: Secondary | ICD-10-CM | POA: Diagnosis not present

## 2017-11-16 DIAGNOSIS — Z7982 Long term (current) use of aspirin: Secondary | ICD-10-CM | POA: Diagnosis not present

## 2017-11-16 DIAGNOSIS — S91301D Unspecified open wound, right foot, subsequent encounter: Secondary | ICD-10-CM | POA: Diagnosis not present

## 2017-11-16 DIAGNOSIS — I951 Orthostatic hypotension: Secondary | ICD-10-CM | POA: Diagnosis not present

## 2017-11-16 DIAGNOSIS — Z87440 Personal history of urinary (tract) infections: Secondary | ICD-10-CM | POA: Diagnosis not present

## 2017-11-16 DIAGNOSIS — M1990 Unspecified osteoarthritis, unspecified site: Secondary | ICD-10-CM | POA: Diagnosis not present

## 2017-11-17 DIAGNOSIS — I10 Essential (primary) hypertension: Secondary | ICD-10-CM | POA: Diagnosis not present

## 2017-11-18 DIAGNOSIS — I10 Essential (primary) hypertension: Secondary | ICD-10-CM | POA: Diagnosis not present

## 2017-11-19 DIAGNOSIS — I4891 Unspecified atrial fibrillation: Secondary | ICD-10-CM | POA: Diagnosis not present

## 2017-11-19 DIAGNOSIS — I951 Orthostatic hypotension: Secondary | ICD-10-CM | POA: Diagnosis not present

## 2017-11-19 DIAGNOSIS — I10 Essential (primary) hypertension: Secondary | ICD-10-CM | POA: Diagnosis not present

## 2017-11-19 DIAGNOSIS — M1990 Unspecified osteoarthritis, unspecified site: Secondary | ICD-10-CM | POA: Diagnosis not present

## 2017-11-19 DIAGNOSIS — S60212D Contusion of left wrist, subsequent encounter: Secondary | ICD-10-CM | POA: Diagnosis not present

## 2017-11-19 DIAGNOSIS — S91301D Unspecified open wound, right foot, subsequent encounter: Secondary | ICD-10-CM | POA: Diagnosis not present

## 2017-11-19 DIAGNOSIS — Z87440 Personal history of urinary (tract) infections: Secondary | ICD-10-CM | POA: Diagnosis not present

## 2017-11-19 DIAGNOSIS — I5030 Unspecified diastolic (congestive) heart failure: Secondary | ICD-10-CM | POA: Diagnosis not present

## 2017-11-19 DIAGNOSIS — Z7982 Long term (current) use of aspirin: Secondary | ICD-10-CM | POA: Diagnosis not present

## 2017-11-19 DIAGNOSIS — F0390 Unspecified dementia without behavioral disturbance: Secondary | ICD-10-CM | POA: Diagnosis not present

## 2017-11-20 DIAGNOSIS — I10 Essential (primary) hypertension: Secondary | ICD-10-CM | POA: Diagnosis not present

## 2017-11-21 DIAGNOSIS — I5030 Unspecified diastolic (congestive) heart failure: Secondary | ICD-10-CM | POA: Diagnosis not present

## 2017-11-21 DIAGNOSIS — Z7982 Long term (current) use of aspirin: Secondary | ICD-10-CM | POA: Diagnosis not present

## 2017-11-21 DIAGNOSIS — S91301D Unspecified open wound, right foot, subsequent encounter: Secondary | ICD-10-CM | POA: Diagnosis not present

## 2017-11-21 DIAGNOSIS — I4891 Unspecified atrial fibrillation: Secondary | ICD-10-CM | POA: Diagnosis not present

## 2017-11-21 DIAGNOSIS — M1990 Unspecified osteoarthritis, unspecified site: Secondary | ICD-10-CM | POA: Diagnosis not present

## 2017-11-21 DIAGNOSIS — S60212D Contusion of left wrist, subsequent encounter: Secondary | ICD-10-CM | POA: Diagnosis not present

## 2017-11-21 DIAGNOSIS — I10 Essential (primary) hypertension: Secondary | ICD-10-CM | POA: Diagnosis not present

## 2017-11-21 DIAGNOSIS — I951 Orthostatic hypotension: Secondary | ICD-10-CM | POA: Diagnosis not present

## 2017-11-21 DIAGNOSIS — F0390 Unspecified dementia without behavioral disturbance: Secondary | ICD-10-CM | POA: Diagnosis not present

## 2017-11-21 DIAGNOSIS — Z87440 Personal history of urinary (tract) infections: Secondary | ICD-10-CM | POA: Diagnosis not present

## 2017-11-22 DIAGNOSIS — I10 Essential (primary) hypertension: Secondary | ICD-10-CM | POA: Diagnosis not present

## 2017-11-23 DIAGNOSIS — R21 Rash and other nonspecific skin eruption: Secondary | ICD-10-CM | POA: Diagnosis not present

## 2017-11-23 DIAGNOSIS — I10 Essential (primary) hypertension: Secondary | ICD-10-CM | POA: Diagnosis not present

## 2017-11-24 DIAGNOSIS — I10 Essential (primary) hypertension: Secondary | ICD-10-CM | POA: Diagnosis not present

## 2017-11-25 DIAGNOSIS — I10 Essential (primary) hypertension: Secondary | ICD-10-CM | POA: Diagnosis not present

## 2017-11-26 ENCOUNTER — Emergency Department (HOSPITAL_COMMUNITY)
Admission: EM | Admit: 2017-11-26 | Discharge: 2017-11-27 | Disposition: A | Discharge status: Home / Self Care | Payer: Medicare HMO | Attending: Emergency Medicine | Admitting: Emergency Medicine

## 2017-11-26 DIAGNOSIS — Z87891 Personal history of nicotine dependence: Secondary | ICD-10-CM | POA: Insufficient documentation

## 2017-11-26 DIAGNOSIS — I5030 Unspecified diastolic (congestive) heart failure: Secondary | ICD-10-CM | POA: Diagnosis not present

## 2017-11-26 DIAGNOSIS — R0789 Other chest pain: Secondary | ICD-10-CM | POA: Diagnosis not present

## 2017-11-26 DIAGNOSIS — I10 Essential (primary) hypertension: Secondary | ICD-10-CM | POA: Diagnosis not present

## 2017-11-26 DIAGNOSIS — I5032 Chronic diastolic (congestive) heart failure: Secondary | ICD-10-CM | POA: Insufficient documentation

## 2017-11-26 DIAGNOSIS — I4891 Unspecified atrial fibrillation: Secondary | ICD-10-CM | POA: Insufficient documentation

## 2017-11-26 DIAGNOSIS — I951 Orthostatic hypotension: Secondary | ICD-10-CM | POA: Diagnosis not present

## 2017-11-26 DIAGNOSIS — Z853 Personal history of malignant neoplasm of breast: Secondary | ICD-10-CM | POA: Diagnosis not present

## 2017-11-26 DIAGNOSIS — F0390 Unspecified dementia without behavioral disturbance: Secondary | ICD-10-CM | POA: Insufficient documentation

## 2017-11-26 DIAGNOSIS — I471 Supraventricular tachycardia: Secondary | ICD-10-CM | POA: Insufficient documentation

## 2017-11-26 DIAGNOSIS — E785 Hyperlipidemia, unspecified: Secondary | ICD-10-CM | POA: Diagnosis not present

## 2017-11-26 DIAGNOSIS — Z8673 Personal history of transient ischemic attack (TIA), and cerebral infarction without residual deficits: Secondary | ICD-10-CM | POA: Insufficient documentation

## 2017-11-26 DIAGNOSIS — S60212D Contusion of left wrist, subsequent encounter: Secondary | ICD-10-CM | POA: Diagnosis not present

## 2017-11-26 DIAGNOSIS — I110 Hypertensive heart disease with heart failure: Secondary | ICD-10-CM | POA: Insufficient documentation

## 2017-11-26 DIAGNOSIS — Z87440 Personal history of urinary (tract) infections: Secondary | ICD-10-CM | POA: Diagnosis not present

## 2017-11-26 DIAGNOSIS — M1990 Unspecified osteoarthritis, unspecified site: Secondary | ICD-10-CM | POA: Diagnosis not present

## 2017-11-26 DIAGNOSIS — Z79899 Other long term (current) drug therapy: Secondary | ICD-10-CM | POA: Diagnosis not present

## 2017-11-26 DIAGNOSIS — Z7982 Long term (current) use of aspirin: Secondary | ICD-10-CM | POA: Insufficient documentation

## 2017-11-26 DIAGNOSIS — R404 Transient alteration of awareness: Secondary | ICD-10-CM | POA: Diagnosis not present

## 2017-11-26 DIAGNOSIS — R079 Chest pain, unspecified: Secondary | ICD-10-CM | POA: Diagnosis not present

## 2017-11-26 DIAGNOSIS — S91301D Unspecified open wound, right foot, subsequent encounter: Secondary | ICD-10-CM | POA: Diagnosis not present

## 2017-11-26 DIAGNOSIS — R410 Disorientation, unspecified: Secondary | ICD-10-CM | POA: Diagnosis not present

## 2017-11-26 NOTE — ED Provider Notes (Signed)
[facility] EMERGENCY DEPARTMENT Provider Note   CSN: [id number] Arrival date & time: 11/26/17  2141   LEVEL 5 CAVEAT - DEMENTIA   History   Chief Complaint Chief Complaint  Patient presents with  . Tachycardia    HPI Alexis Winters is a 84 y.o. female.  HPI  84 year old female presents with SVT. History is from daughter at bedside.  Has had SVT many times before.  Complained that her chest was hurting to the nursing home staff and when EMS was called was found to be in SVT.  Staff had previously tried to the home maneuver such as cold water without relief.  SVT broke with adenosine by EMS.  Currently has no complaints and is acting at her baseline according to daughter.  Patient has not been sick recently, including no fever, diarrhea, vomiting, etc.  Past Medical History:  Diagnosis Date  . Allergic rhinitis   . Arrhythmia    a. office to call pt on 09/15/13 for 30 day event monitor  . Atrial fibrillation ([place])    a. h/o PAF, in NSR during admission 09/11/13  . Breast cancer ([place])   . Chronic diastolic CHF (congestive heart failure) ([place])    a. echo 09/12/13 EF 03-47%, grade 1 diastolic dysf, mild LVOT obstr, moderate MR, elevated filling pressure  . Dementia   . Dyslipidemia   . Enlarged liver   . Frequent UTI   . GERD (gastroesophageal reflux disease)   . Hard of hearing   . Hemorrhoids   . Hypertension   . Hypoglycemia   . Mitral regurgitation    a. moderate MR by echo 09/12/2013  . OA (osteoarthritis)    hip and back  . Orthostatic hypotension   . Phlebitis    l leg  . Prediabetes     Patient Active Problem List   Diagnosis Date Noted  . TIA (transient ischemic attack) 11/15/2016  . GERD (gastroesophageal reflux disease) 11/15/2016  . Atrial fibrillation ([place])   . SVT (supraventricular tachycardia) ([place]) 08/18/2016  . Dementia without behavioral disturbance 08/18/2016  . UTI (urinary tract infection) 05/29/2016  . Hypokalemia 04/28/2016    . Rash and nonspecific skin eruption 04/28/2016  . Bradycardia 04/27/2016  . Atrial flutter, paroxysmal ([place]) 06/22/2015  . Near syncope 01/10/2015  . Neck pain on left side 01/10/2015  . Pulmonary nodule, right 01/10/2015  . Leukocytosis 01/10/2015  . Hypocalcemia 01/10/2015  . Dyspnea 01/10/2015  . Chest pain 09/11/2013  . Bilateral neck pain 09/11/2013  . Angina at rest [facility]) 09/11/2013  . Leg edema 09/05/2013  . Failure to thrive 09/05/2013  . Paroxysmal atrial fibrillation ([place]) 09/05/2013  . Chronic diastolic CHF (congestive heart failure) ([place]) 09/05/2013    Past Surgical History:  Procedure Laterality Date  . ABDOMINAL HYSTERECTOMY    . BLADDER SUSPENSION    . BREAST LUMPECTOMY    . CARDIAC CATHETERIZATION  1980's   [facility]  . FOOT SURGERY     left foot  . INNER EAR SURGERY       OB History   None      Home Medications    Prior to Admission medications   Medication Sig Start Date End Date Taking? Authorizing Provider  acetaminophen (TYLENOL) 500 MG tablet Take 500 mg by mouth every 4 (four) hours as needed for mild pain, fever or headache.     [provider]  alum & mag hydroxide-simeth ([place]) 200-200-20 MG/5ML suspension Take 30 mLs by mouth  every 6 (six) hours as needed for indigestion or heartburn.    [provider]  aspirin 325 MG tablet Take 1 tablet (325 mg total) by mouth daily. 11/17/16   Dhungel, Flonnie Overman, MD  atorvastatin (LIPITOR) 40 MG tablet Take 1 tablet (40 mg total) by mouth daily at 6 PM. 11/16/16   Dhungel, Nishant, MD  carvedilol (COREG) 3.125 MG tablet Take 1 tablet (3.125 mg total) by mouth 2 (two) times daily. 02/13/17 05/14/17  Nahser, Wonda Cheng, MD  cephALEXin (KEFLEX) 500 MG capsule Take 1 capsule (500 mg total) by mouth 4 (four) times daily. 03/03/17   Janne Napoleon, NP  Cranberry 450 MG TABS Take 450 mg by mouth 2 (two) times daily.     [provider]  furosemide (LASIX) 40 MG tablet Take 40 mg by mouth 2 (two)  times daily.  04/09/12   [provider]  guaifenesin (ROBITUSSIN) 100 MG/5ML syrup Take 200 mg by mouth every 6 (six) hours as needed for cough.    [provider]  ibuprofen (ADVIL,MOTRIN) 200 MG tablet Take 200 mg by mouth every 6 (six) hours as needed (back pain). Reported on 06/22/2015    [provider]  isosorbide mononitrate (IMDUR) 30 MG 24 hr tablet Take 1 tablet (30 mg total) by mouth daily. 10/13/15   Nahser, Wonda Cheng, MD  loperamide (IMODIUM) 2 MG capsule Take 2 mg by mouth daily as needed for diarrhea or loose stools.    [provider]  magnesium hydroxide (MILK OF MAGNESIA) 400 MG/5ML suspension Take 30 mLs by mouth at bedtime as needed for mild constipation.    [provider]  methenamine (MANDELAMINE) 1 g tablet Take 1,000 mg by mouth 2 (two) times daily.    [provider]  mirabegron ER (MYRBETRIQ) 25 MG TB24 tablet Take 25 mg by mouth daily.    [provider]  Neomycin-Bacitracin-Polymyxin (HCA TRIPLE ANTIBIOTIC OINTMENT EX) Apply 1 application topically daily as needed (skin tears/abrasions).     [provider]  pantoprazole (PROTONIX) 40 MG tablet Take 40 mg by mouth daily. 10/19/14   [provider]  potassium chloride (K-DUR) 10 MEQ tablet Take 2 tablets (20 mEq total) by mouth 3 (three) times daily. 06/21/16   Sherwood Gambler, MD  vitamin C (ASCORBIC ACID) 500 MG tablet Take 500 mg by mouth daily.    [provider]    Family History Family History  Problem Relation Age of Onset  . Cerebral palsy Sister   . Alzheimer's disease Brother   . Hypertension Father   . Heart disease Brother     Social History Social History   Tobacco Use  . Smoking status: Former Research scientist (life sciences)  . Smokeless tobacco: Never Used  Substance Use Topics  . Alcohol use: No  . Drug use: No     Allergies   Contrast media [iodinated diagnostic agents]; Metoprolol; Pyridium [phenazopyridine hcl]; Levaquin  [levofloxacin in d5w]; Macrodantin [nitrofurantoin macrocrystal]; Other; Tape; and Sulfa antibiotics   Review of Systems Review of Systems  Unable to perform ROS: Dementia     Physical Exam Updated Vital Signs BP 127/68 (BP Location: Right Arm)   Pulse 70   Resp 17   SpO2 99%   Physical Exam  Constitutional: She appears well-developed and well-nourished.  HENT:  Head: Normocephalic and atraumatic.  Right Ear: External ear normal.  Left Ear: External ear normal.  Nose: Nose normal.  Eyes: Right eye exhibits no discharge. Left eye exhibits no discharge.  Cardiovascular: Normal rate, regular rhythm and normal heart sounds.  Pulmonary/Chest: Effort normal and breath sounds normal.  Abdominal: Soft. There is no tenderness.  Neurological: She is alert. She is disoriented.  Skin: Skin is warm and dry.  Nursing note and vitals reviewed.    ED Treatments / Results  Labs (all labs ordered are listed, but only abnormal results are displayed) Labs Reviewed - No data to display  EKG EKG Interpretation  Date/Time:  Monday November 26 2017 22:14:53 EDT Ventricular Rate:  70 PR Interval:    QRS Duration: 91 QT Interval:  409 QTC Calculation: 442 R Axis:   39 Text Interpretation:  Sinus rhythm Abnormal R-wave progression, early transition Borderline ST elevation, anterior leads no significant change since Feb 2019 Confirmed by Sherwood Gambler [phone]) on 11/26/2017 10:17:47 PM   Radiology No results found.  Procedures Procedures (including critical care time)  Medications Ordered in ED Medications - No data to display   Initial Impression / Assessment and Plan / ED Course  I have reviewed the triage vital signs and the nursing notes.  Pertinent labs & imaging results that were available during my care of the patient were reviewed by me and considered in my medical decision making (see chart for details).     The patient is in normal sinus rhythm currently.  She is at  her mental baseline according to daughter.  Daughter requests no tests as she has been in SVT many times before and this is all very similar.  I think this is reasonable as I think ACS is much less likely.  I did offer testing for electrolytes but more likely than not, she went into SVT due to the nature of the disease rather than a significant cause.  She was offered this multiple times and declines.  I think this is reasonable given age and care.  Discharged to facility with return precautions.  Final Clinical Impressions(s) / ED Diagnoses   Final diagnoses:  SVT (supraventricular tachycardia) [facility])    ED Discharge Orders    None       Sherwood Gambler, MD 11/26/17 2347

## 2017-11-26 NOTE — Discharge Instructions (Addendum)
If you develop recurrent chest pain, palpitations, SVT, or any other new/concerning symptoms and return to the ER for evaluation.

## 2017-11-26 NOTE — ED Triage Notes (Signed)
Pt arrives via [facility], SVT with hx of same. Initial HR 160, converted with 6mg  adenosine and now HR 73, NSR with PAC's. Dementia at baseline, 18 LFA  98%RA, 136/90,

## 2017-11-27 DIAGNOSIS — I10 Essential (primary) hypertension: Secondary | ICD-10-CM | POA: Diagnosis not present

## 2017-11-27 DIAGNOSIS — I5030 Unspecified diastolic (congestive) heart failure: Secondary | ICD-10-CM | POA: Diagnosis not present

## 2017-11-27 DIAGNOSIS — S60212D Contusion of left wrist, subsequent encounter: Secondary | ICD-10-CM | POA: Diagnosis not present

## 2017-11-27 DIAGNOSIS — I4891 Unspecified atrial fibrillation: Secondary | ICD-10-CM | POA: Diagnosis not present

## 2017-11-27 DIAGNOSIS — Z87440 Personal history of urinary (tract) infections: Secondary | ICD-10-CM | POA: Diagnosis not present

## 2017-11-27 DIAGNOSIS — Z7982 Long term (current) use of aspirin: Secondary | ICD-10-CM | POA: Diagnosis not present

## 2017-11-27 DIAGNOSIS — I951 Orthostatic hypotension: Secondary | ICD-10-CM | POA: Diagnosis not present

## 2017-11-27 DIAGNOSIS — F0390 Unspecified dementia without behavioral disturbance: Secondary | ICD-10-CM | POA: Diagnosis not present

## 2017-11-27 DIAGNOSIS — M1990 Unspecified osteoarthritis, unspecified site: Secondary | ICD-10-CM | POA: Diagnosis not present

## 2017-11-27 DIAGNOSIS — S91301D Unspecified open wound, right foot, subsequent encounter: Secondary | ICD-10-CM | POA: Diagnosis not present

## 2017-11-28 DIAGNOSIS — I10 Essential (primary) hypertension: Secondary | ICD-10-CM | POA: Diagnosis not present

## 2017-11-29 DIAGNOSIS — I10 Essential (primary) hypertension: Secondary | ICD-10-CM | POA: Diagnosis not present

## 2017-11-30 DIAGNOSIS — I10 Essential (primary) hypertension: Secondary | ICD-10-CM | POA: Diagnosis not present

## 2017-12-01 DIAGNOSIS — I10 Essential (primary) hypertension: Secondary | ICD-10-CM | POA: Diagnosis not present

## 2017-12-02 DIAGNOSIS — I10 Essential (primary) hypertension: Secondary | ICD-10-CM | POA: Diagnosis not present

## 2017-12-03 DIAGNOSIS — G301 Alzheimer's disease with late onset: Secondary | ICD-10-CM | POA: Diagnosis not present

## 2017-12-03 DIAGNOSIS — Z66 Do not resuscitate: Secondary | ICD-10-CM | POA: Diagnosis not present

## 2017-12-03 DIAGNOSIS — B354 Tinea corporis: Secondary | ICD-10-CM | POA: Diagnosis not present

## 2017-12-10 DIAGNOSIS — I10 Essential (primary) hypertension: Secondary | ICD-10-CM | POA: Diagnosis not present

## 2017-12-11 DIAGNOSIS — I10 Essential (primary) hypertension: Secondary | ICD-10-CM | POA: Diagnosis not present

## 2017-12-12 DIAGNOSIS — I4891 Unspecified atrial fibrillation: Secondary | ICD-10-CM | POA: Diagnosis not present

## 2017-12-12 DIAGNOSIS — S91301D Unspecified open wound, right foot, subsequent encounter: Secondary | ICD-10-CM | POA: Diagnosis not present

## 2017-12-12 DIAGNOSIS — I10 Essential (primary) hypertension: Secondary | ICD-10-CM | POA: Diagnosis not present

## 2017-12-12 DIAGNOSIS — M1990 Unspecified osteoarthritis, unspecified site: Secondary | ICD-10-CM | POA: Diagnosis not present

## 2017-12-12 DIAGNOSIS — I951 Orthostatic hypotension: Secondary | ICD-10-CM | POA: Diagnosis not present

## 2017-12-12 DIAGNOSIS — F0390 Unspecified dementia without behavioral disturbance: Secondary | ICD-10-CM | POA: Diagnosis not present

## 2017-12-12 DIAGNOSIS — S60212D Contusion of left wrist, subsequent encounter: Secondary | ICD-10-CM | POA: Diagnosis not present

## 2017-12-12 DIAGNOSIS — Z7982 Long term (current) use of aspirin: Secondary | ICD-10-CM | POA: Diagnosis not present

## 2017-12-12 DIAGNOSIS — Z87440 Personal history of urinary (tract) infections: Secondary | ICD-10-CM | POA: Diagnosis not present

## 2017-12-12 DIAGNOSIS — I5030 Unspecified diastolic (congestive) heart failure: Secondary | ICD-10-CM | POA: Diagnosis not present

## 2017-12-13 DIAGNOSIS — I10 Essential (primary) hypertension: Secondary | ICD-10-CM | POA: Diagnosis not present

## 2017-12-14 DIAGNOSIS — R21 Rash and other nonspecific skin eruption: Secondary | ICD-10-CM | POA: Diagnosis not present

## 2017-12-14 DIAGNOSIS — I10 Essential (primary) hypertension: Secondary | ICD-10-CM | POA: Diagnosis not present

## 2017-12-14 DIAGNOSIS — L989 Disorder of the skin and subcutaneous tissue, unspecified: Secondary | ICD-10-CM | POA: Diagnosis not present

## 2017-12-15 DIAGNOSIS — I10 Essential (primary) hypertension: Secondary | ICD-10-CM | POA: Diagnosis not present

## 2017-12-16 DIAGNOSIS — I10 Essential (primary) hypertension: Secondary | ICD-10-CM | POA: Diagnosis not present

## 2017-12-17 DIAGNOSIS — I10 Essential (primary) hypertension: Secondary | ICD-10-CM | POA: Diagnosis not present

## 2017-12-18 DIAGNOSIS — I10 Essential (primary) hypertension: Secondary | ICD-10-CM | POA: Diagnosis not present

## 2017-12-19 DIAGNOSIS — I10 Essential (primary) hypertension: Secondary | ICD-10-CM | POA: Diagnosis not present

## 2017-12-20 DIAGNOSIS — I10 Essential (primary) hypertension: Secondary | ICD-10-CM | POA: Diagnosis not present

## 2017-12-21 DIAGNOSIS — I10 Essential (primary) hypertension: Secondary | ICD-10-CM | POA: Diagnosis not present

## 2017-12-22 DIAGNOSIS — I10 Essential (primary) hypertension: Secondary | ICD-10-CM | POA: Diagnosis not present

## 2017-12-23 DIAGNOSIS — I10 Essential (primary) hypertension: Secondary | ICD-10-CM | POA: Diagnosis not present

## 2017-12-24 DIAGNOSIS — I10 Essential (primary) hypertension: Secondary | ICD-10-CM | POA: Diagnosis not present

## 2017-12-25 DIAGNOSIS — I10 Essential (primary) hypertension: Secondary | ICD-10-CM | POA: Diagnosis not present

## 2017-12-26 DIAGNOSIS — I10 Essential (primary) hypertension: Secondary | ICD-10-CM | POA: Diagnosis not present

## 2017-12-27 DIAGNOSIS — I10 Essential (primary) hypertension: Secondary | ICD-10-CM | POA: Diagnosis not present

## 2018-01-03 DIAGNOSIS — Z79899 Other long term (current) drug therapy: Secondary | ICD-10-CM | POA: Diagnosis not present

## 2018-01-04 DIAGNOSIS — F0390 Unspecified dementia without behavioral disturbance: Secondary | ICD-10-CM | POA: Diagnosis not present

## 2018-01-05 DIAGNOSIS — F0390 Unspecified dementia without behavioral disturbance: Secondary | ICD-10-CM | POA: Diagnosis not present

## 2018-01-06 DIAGNOSIS — F0390 Unspecified dementia without behavioral disturbance: Secondary | ICD-10-CM | POA: Diagnosis not present

## 2018-01-07 DIAGNOSIS — I872 Venous insufficiency (chronic) (peripheral): Secondary | ICD-10-CM | POA: Diagnosis not present

## 2018-01-07 DIAGNOSIS — F0390 Unspecified dementia without behavioral disturbance: Secondary | ICD-10-CM | POA: Diagnosis not present

## 2018-01-07 DIAGNOSIS — L03116 Cellulitis of left lower limb: Secondary | ICD-10-CM | POA: Diagnosis not present

## 2018-01-07 DIAGNOSIS — R21 Rash and other nonspecific skin eruption: Secondary | ICD-10-CM | POA: Diagnosis not present

## 2018-01-08 DIAGNOSIS — I890 Lymphedema, not elsewhere classified: Secondary | ICD-10-CM | POA: Diagnosis not present

## 2018-01-08 DIAGNOSIS — C44329 Squamous cell carcinoma of skin of other parts of face: Secondary | ICD-10-CM | POA: Diagnosis not present

## 2018-01-08 DIAGNOSIS — I872 Venous insufficiency (chronic) (peripheral): Secondary | ICD-10-CM | POA: Diagnosis not present

## 2018-01-08 DIAGNOSIS — F0390 Unspecified dementia without behavioral disturbance: Secondary | ICD-10-CM | POA: Diagnosis not present

## 2018-01-08 DIAGNOSIS — C44629 Squamous cell carcinoma of skin of left upper limb, including shoulder: Secondary | ICD-10-CM | POA: Diagnosis not present

## 2018-01-09 DIAGNOSIS — F0390 Unspecified dementia without behavioral disturbance: Secondary | ICD-10-CM | POA: Diagnosis not present

## 2018-01-10 DIAGNOSIS — F0390 Unspecified dementia without behavioral disturbance: Secondary | ICD-10-CM | POA: Diagnosis not present

## 2018-01-11 DIAGNOSIS — F0390 Unspecified dementia without behavioral disturbance: Secondary | ICD-10-CM | POA: Diagnosis not present

## 2018-01-12 DIAGNOSIS — F0390 Unspecified dementia without behavioral disturbance: Secondary | ICD-10-CM | POA: Diagnosis not present

## 2018-01-13 DIAGNOSIS — F0390 Unspecified dementia without behavioral disturbance: Secondary | ICD-10-CM | POA: Diagnosis not present

## 2018-01-14 DIAGNOSIS — F0390 Unspecified dementia without behavioral disturbance: Secondary | ICD-10-CM | POA: Diagnosis not present

## 2018-01-15 DIAGNOSIS — F0390 Unspecified dementia without behavioral disturbance: Secondary | ICD-10-CM | POA: Diagnosis not present

## 2018-01-16 DIAGNOSIS — F0390 Unspecified dementia without behavioral disturbance: Secondary | ICD-10-CM | POA: Diagnosis not present

## 2018-01-17 DIAGNOSIS — F0390 Unspecified dementia without behavioral disturbance: Secondary | ICD-10-CM | POA: Diagnosis not present

## 2018-01-18 DIAGNOSIS — F0390 Unspecified dementia without behavioral disturbance: Secondary | ICD-10-CM | POA: Diagnosis not present

## 2018-01-19 DIAGNOSIS — F0390 Unspecified dementia without behavioral disturbance: Secondary | ICD-10-CM | POA: Diagnosis not present

## 2018-01-20 DIAGNOSIS — I110 Hypertensive heart disease with heart failure: Secondary | ICD-10-CM | POA: Diagnosis not present

## 2018-01-20 DIAGNOSIS — F0390 Unspecified dementia without behavioral disturbance: Secondary | ICD-10-CM | POA: Diagnosis not present

## 2018-01-20 DIAGNOSIS — M479 Spondylosis, unspecified: Secondary | ICD-10-CM | POA: Diagnosis not present

## 2018-01-20 DIAGNOSIS — R7303 Prediabetes: Secondary | ICD-10-CM | POA: Diagnosis not present

## 2018-01-20 DIAGNOSIS — I480 Paroxysmal atrial fibrillation: Secondary | ICD-10-CM | POA: Diagnosis not present

## 2018-01-20 DIAGNOSIS — R21 Rash and other nonspecific skin eruption: Secondary | ICD-10-CM | POA: Diagnosis not present

## 2018-01-20 DIAGNOSIS — I5032 Chronic diastolic (congestive) heart failure: Secondary | ICD-10-CM | POA: Diagnosis not present

## 2018-01-20 DIAGNOSIS — I340 Nonrheumatic mitral (valve) insufficiency: Secondary | ICD-10-CM | POA: Diagnosis not present

## 2018-01-20 DIAGNOSIS — M169 Osteoarthritis of hip, unspecified: Secondary | ICD-10-CM | POA: Diagnosis not present

## 2018-01-21 DIAGNOSIS — F0390 Unspecified dementia without behavioral disturbance: Secondary | ICD-10-CM | POA: Diagnosis not present

## 2018-01-22 DIAGNOSIS — F0390 Unspecified dementia without behavioral disturbance: Secondary | ICD-10-CM | POA: Diagnosis not present

## 2018-01-23 DIAGNOSIS — F0390 Unspecified dementia without behavioral disturbance: Secondary | ICD-10-CM | POA: Diagnosis not present

## 2018-01-24 DIAGNOSIS — F0390 Unspecified dementia without behavioral disturbance: Secondary | ICD-10-CM | POA: Diagnosis not present

## 2018-01-25 DIAGNOSIS — F0390 Unspecified dementia without behavioral disturbance: Secondary | ICD-10-CM | POA: Diagnosis not present

## 2018-01-26 DIAGNOSIS — F0390 Unspecified dementia without behavioral disturbance: Secondary | ICD-10-CM | POA: Diagnosis not present

## 2018-01-27 DIAGNOSIS — F0390 Unspecified dementia without behavioral disturbance: Secondary | ICD-10-CM | POA: Diagnosis not present

## 2018-01-28 DIAGNOSIS — F0390 Unspecified dementia without behavioral disturbance: Secondary | ICD-10-CM | POA: Diagnosis not present

## 2018-01-29 DIAGNOSIS — F0390 Unspecified dementia without behavioral disturbance: Secondary | ICD-10-CM | POA: Diagnosis not present

## 2018-01-30 DIAGNOSIS — F0390 Unspecified dementia without behavioral disturbance: Secondary | ICD-10-CM | POA: Diagnosis not present

## 2018-01-31 DIAGNOSIS — F0390 Unspecified dementia without behavioral disturbance: Secondary | ICD-10-CM | POA: Diagnosis not present

## 2018-02-01 DIAGNOSIS — I5030 Unspecified diastolic (congestive) heart failure: Secondary | ICD-10-CM | POA: Diagnosis not present

## 2018-02-01 DIAGNOSIS — F0390 Unspecified dementia without behavioral disturbance: Secondary | ICD-10-CM | POA: Diagnosis not present

## 2018-02-01 DIAGNOSIS — I872 Venous insufficiency (chronic) (peripheral): Secondary | ICD-10-CM | POA: Diagnosis not present

## 2018-02-02 DIAGNOSIS — F0390 Unspecified dementia without behavioral disturbance: Secondary | ICD-10-CM | POA: Diagnosis not present

## 2018-02-03 DIAGNOSIS — F0390 Unspecified dementia without behavioral disturbance: Secondary | ICD-10-CM | POA: Diagnosis not present

## 2018-02-04 DIAGNOSIS — F0390 Unspecified dementia without behavioral disturbance: Secondary | ICD-10-CM | POA: Diagnosis not present

## 2018-02-05 DIAGNOSIS — F0390 Unspecified dementia without behavioral disturbance: Secondary | ICD-10-CM | POA: Diagnosis not present

## 2018-02-06 ENCOUNTER — Emergency Department
Admission: EM | Admit: 2018-02-06 | Discharge: 2018-02-06 | Disposition: A | Discharge status: Home / Self Care | Payer: Medicare HMO | Attending: Emergency Medicine | Admitting: Emergency Medicine

## 2018-02-06 ENCOUNTER — Emergency Department: Payer: Medicare HMO

## 2018-02-06 ENCOUNTER — Other Ambulatory Visit: Payer: Self-pay

## 2018-02-06 DIAGNOSIS — F0390 Unspecified dementia without behavioral disturbance: Secondary | ICD-10-CM | POA: Insufficient documentation

## 2018-02-06 DIAGNOSIS — R002 Palpitations: Secondary | ICD-10-CM | POA: Diagnosis not present

## 2018-02-06 DIAGNOSIS — Z8673 Personal history of transient ischemic attack (TIA), and cerebral infarction without residual deficits: Secondary | ICD-10-CM | POA: Diagnosis not present

## 2018-02-06 DIAGNOSIS — I480 Paroxysmal atrial fibrillation: Secondary | ICD-10-CM | POA: Diagnosis not present

## 2018-02-06 DIAGNOSIS — E785 Hyperlipidemia, unspecified: Secondary | ICD-10-CM | POA: Insufficient documentation

## 2018-02-06 DIAGNOSIS — Z853 Personal history of malignant neoplasm of breast: Secondary | ICD-10-CM | POA: Insufficient documentation

## 2018-02-06 DIAGNOSIS — Z7982 Long term (current) use of aspirin: Secondary | ICD-10-CM | POA: Diagnosis not present

## 2018-02-06 DIAGNOSIS — I5032 Chronic diastolic (congestive) heart failure: Secondary | ICD-10-CM | POA: Diagnosis not present

## 2018-02-06 DIAGNOSIS — R918 Other nonspecific abnormal finding of lung field: Secondary | ICD-10-CM | POA: Diagnosis not present

## 2018-02-06 DIAGNOSIS — Z87891 Personal history of nicotine dependence: Secondary | ICD-10-CM | POA: Insufficient documentation

## 2018-02-06 DIAGNOSIS — J984 Other disorders of lung: Secondary | ICD-10-CM | POA: Diagnosis not present

## 2018-02-06 DIAGNOSIS — Z79899 Other long term (current) drug therapy: Secondary | ICD-10-CM | POA: Diagnosis not present

## 2018-02-06 DIAGNOSIS — I471 Supraventricular tachycardia: Secondary | ICD-10-CM | POA: Diagnosis not present

## 2018-02-06 DIAGNOSIS — I110 Hypertensive heart disease with heart failure: Secondary | ICD-10-CM | POA: Insufficient documentation

## 2018-02-06 LAB — URINALYSIS, COMPLETE (UACMP) WITH MICROSCOPIC
Bilirubin Urine: NEGATIVE
GLUCOSE, UA: NEGATIVE mg/dL
Hgb urine dipstick: NEGATIVE
Ketones, ur: NEGATIVE mg/dL
Leukocytes, UA: NEGATIVE
Nitrite: NEGATIVE
PROTEIN: NEGATIVE mg/dL
Specific Gravity, Urine: 1.003 — ABNORMAL LOW (ref 1.005–1.030)
pH: 7 (ref 5.0–8.0)

## 2018-02-06 LAB — TROPONIN I: Troponin I: <0.03 ng/mL (ref <0.03)

## 2018-02-06 LAB — CBC WITH DIFFERENTIAL/PLATELET
ABS IMMATURE GRANULOCYTES: 0.01 10*3/uL (ref 0.00–0.07)
Basophils Absolute: 0 10*3/uL (ref 0.0–0.1)
Basophils Relative: 1 %
Eosinophils Absolute: 0.2 10*3/uL (ref 0.0–0.5)
Eosinophils Relative: 4 %
HEMATOCRIT: 43.6 % (ref 36.0–46.0)
HEMOGLOBIN: 14.3 g/dL (ref 12.0–15.0)
IMMATURE GRANULOCYTES: 0 %
LYMPHS ABS: 0.9 10*3/uL (ref 0.7–4.0)
LYMPHS PCT: 19 %
MCH: 29.3 pg (ref 26.0–34.0)
MCHC: 32.8 g/dL (ref 30.0–36.0)
MCV: 89.3 fL (ref 80.0–100.0)
MONOS PCT: 11 %
Monocytes Absolute: 0.5 10*3/uL (ref 0.1–1.0)
NEUTROS ABS: 3.1 10*3/uL (ref 1.7–7.7)
NEUTROS PCT: 65 %
Platelets: 130 10*3/uL — ABNORMAL LOW (ref 150–400)
RBC: 4.88 MIL/uL (ref 3.87–5.11)
RDW: 13 % (ref 11.5–15.5)
WBC: 4.7 10*3/uL (ref 4.0–10.5)
nRBC: 0 % (ref 0.0–0.2)

## 2018-02-06 LAB — BASIC METABOLIC PANEL
Anion gap: 7 (ref 5–15)
BUN: 16 mg/dL (ref 8–23)
CHLORIDE: 107 mmol/L (ref 98–111)
CO2: 27 mmol/L (ref 22–32)
CREATININE: 0.75 mg/dL (ref 0.44–1.00)
Calcium: 8.9 mg/dL (ref 8.9–10.3)
GFR calc Af Amer: >60 mL/min (ref >60)
GFR calc non Af Amer: >60 mL/min (ref >60)
GLUCOSE: 151 mg/dL — AB (ref 70–99)
Potassium: 3.1 mmol/L — ABNORMAL LOW (ref 3.5–5.1)
Sodium: 141 mmol/L (ref 135–145)

## 2018-02-06 MED ORDER — SODIUM CHLORIDE 0.9 % IV BOLUS
500.0000 mL | Freq: Once | INTRAVENOUS | Status: AC
  Administered 2018-02-06: 500 mL via INTRAVENOUS

## 2018-02-06 NOTE — ED Provider Notes (Signed)
[facility] EMERGENCY DEPARTMENT Provider Note   CSN: [id number] Arrival date & time: 02/06/18  1846     History   Chief Complaint Chief Complaint  Patient presents with  . Irregular Heart Beat    HPI Alexis Winters is a 84 y.o. female history of atrial fibrillation not currently on blood thinners, SVT dementia, UTIs here presenting with possible SVT.  Patient is from [place] currently.  Patient apparently had acute onset of palpitations.  Patient was noted to be in SVT by EMS and SVT resolved in route with no interventions.  Patient denies any chest pain or shortness of breath.  Of note, patient does have intermittent palpitations but usually it resolved by itself.  Patient did have recent ED visits for SVT.   The history is provided by the patient.   Level V caveat- dementia   Past Medical History:  Diagnosis Date  . Allergic rhinitis   . Arrhythmia    a. office to call pt on 09/15/13 for 30 day event monitor  . Atrial fibrillation ([place])    a. h/o PAF, in NSR during admission 09/11/13  . Breast cancer ([place])   . Chronic diastolic CHF (congestive heart failure) ([place])    a. echo 09/12/13 EF 89-21%, grade 1 diastolic dysf, mild LVOT obstr, moderate MR, elevated filling pressure  . Dementia   . Dyslipidemia   . Enlarged liver   . Frequent UTI   . GERD (gastroesophageal reflux disease)   . Hard of hearing   . Hemorrhoids   . Hypertension   . Hypoglycemia   . Mitral regurgitation    a. moderate MR by echo 09/12/2013  . OA (osteoarthritis)    hip and back  . Orthostatic hypotension   . Phlebitis    l leg  . Prediabetes     Patient Active Problem List   Diagnosis Date Noted  . TIA (transient ischemic attack) 11/15/2016  . GERD (gastroesophageal reflux disease) 11/15/2016  . Atrial fibrillation ([place])   . SVT (supraventricular tachycardia) ([place]) 08/18/2016  . Dementia without behavioral disturbance ([place]) 08/18/2016  . UTI (urinary tract  infection) 05/29/2016  . Hypokalemia 04/28/2016  . Rash and nonspecific skin eruption 04/28/2016  . Bradycardia 04/27/2016  . Atrial flutter, paroxysmal ([place]) 06/22/2015  . Near syncope 01/10/2015  . Neck pain on left side 01/10/2015  . Pulmonary nodule, right 01/10/2015  . Leukocytosis 01/10/2015  . Hypocalcemia 01/10/2015  . Dyspnea 01/10/2015  . Chest pain 09/11/2013  . Bilateral neck pain 09/11/2013  . Angina at rest [facility]) 09/11/2013  . Leg edema 09/05/2013  . Failure to thrive 09/05/2013  . Paroxysmal atrial fibrillation ([place]) 09/05/2013  . Chronic diastolic CHF (congestive heart failure) ([place]) 09/05/2013    Past Surgical History:  Procedure Laterality Date  . ABDOMINAL HYSTERECTOMY    . BLADDER SUSPENSION    . BREAST LUMPECTOMY    . CARDIAC CATHETERIZATION  1980's   [facility]  . FOOT SURGERY     left foot  . INNER EAR SURGERY       OB History   None      Home Medications    Prior to Admission medications   Medication Sig Start Date End Date Taking? Authorizing Provider  acetaminophen (TYLENOL) 500 MG tablet Take 500 mg by mouth every 4 (four) hours as needed for mild pain, fever or headache.    Yes [provider]  alum & mag hydroxide-simeth (MINTOX) 200-200-20 MG/5ML suspension Take 30 mLs by  mouth every 6 (six) hours as needed for indigestion or heartburn (max 4 doses in 24 hours).    Yes [provider]  aspirin 81 MG chewable tablet Chew by mouth daily.   Yes [provider]  atorvastatin (LIPITOR) 40 MG tablet Take 1 tablet (40 mg total) by mouth daily at 6 PM. Patient taking differently: Take 40 mg by mouth at bedtime.  11/16/16  Yes Dhungel, Nishant, MD  augmented betamethasone dipropionate (DIPROLENE-AF) 0.05 % cream Apply 1 application topically 2 (two) times daily as needed (avoid face, groin and underarms).   Yes [provider]  carvedilol (COREG) 3.125 MG tablet Take 1 tablet (3.125 mg total) by mouth 2 (two) times daily.  02/13/17 02/06/18 Yes Nahser, Wonda Cheng, MD  Cranberry 450 MG TABS Take 450 mg by mouth 2 (two) times daily.    Yes [provider]  furosemide (LASIX) 40 MG tablet Take 40 mg by mouth 2 (two) times daily.  04/09/12  Yes [provider]  guaifenesin (ROBITUSSIN) 100 MG/5ML syrup Take 200 mg by mouth every 6 (six) hours as needed for cough.   Yes [provider]  isosorbide mononitrate (IMDUR) 30 MG 24 hr tablet Take 1 tablet (30 mg total) by mouth daily. 10/13/15  Yes Nahser, Wonda Cheng, MD  loperamide (IMODIUM) 2 MG capsule Take 2 mg by mouth daily as needed for diarrhea or loose stools (max 8 doses in 24 hours).    Yes [provider]  magnesium hydroxide (MILK OF MAGNESIA) 400 MG/5ML suspension Take 30 mLs by mouth at bedtime as needed for mild constipation.   Yes [provider]  methenamine (MANDELAMINE) 1 g tablet Take 1,000 mg by mouth 2 (two) times daily.   Yes [provider]  mirabegron ER (MYRBETRIQ) 25 MG TB24 tablet Take 25 mg by mouth daily.   Yes [provider]  Neomycin-Bacitracin-Polymyxin (HCA TRIPLE ANTIBIOTIC OINTMENT EX) Apply 1 application topically 4 (four) times daily as needed (skin tears/abrasions).    Yes [provider]  nystatin (NYSTATIN) powder Apply 1 g topically 2 (two) times daily. (apply to skin folds)   Yes [provider]  pantoprazole (PROTONIX) 40 MG tablet Take 40 mg by mouth daily. 10/19/14  Yes [provider]  potassium chloride (K-DUR) 10 MEQ tablet Take 2 tablets (20 mEq total) by mouth 3 (three) times daily. 06/21/16  Yes Sherwood Gambler, MD  triamcinolone cream (KENALOG) 0.1 % Apply 1 application topically 2 (two) times daily.   Yes [provider]  vitamin C (ASCORBIC ACID) 500 MG tablet Take 500 mg by mouth daily.   Yes [provider]  aspirin 325 MG tablet Take 1 tablet (325 mg total) by mouth daily. 11/17/16   Dhungel, Nishant, MD  cephALEXin  (KEFLEX) 500 MG capsule Take 1 capsule (500 mg total) by mouth 4 (four) times daily. 03/03/17   Janne Napoleon, NP    Family History Family History  Problem Relation Age of Onset  . Cerebral palsy Sister   . Alzheimer's disease Brother   . Hypertension Father   . Heart disease Brother     Social History Social History   Tobacco Use  . Smoking status: Former Research scientist (life sciences)  . Smokeless tobacco: Never Used  Substance Use Topics  . Alcohol use: No  . Drug use: No     Allergies   Contrast media [iodinated diagnostic agents]; Metoprolol; Pyridium [phenazopyridine hcl]; Levaquin [levofloxacin in d5w]; Macrodantin [nitrofurantoin macrocrystal]; Other; Tape; and Sulfa antibiotics  Review of Systems Review of Systems  Cardiovascular: Positive for palpitations.  All other systems reviewed and are negative.    Physical Exam Updated Vital Signs BP (!) 138/55   Pulse 61   Temp 97.7 F (36.5 C) (Oral)   Resp 14   Ht 5\' 2"  (1.575 m)   Wt 49.9 kg   SpO2 99%   BMI 20.12 kg/m   Physical Exam  Constitutional:  Demented, well appearing for age   HENT:  Head: Normocephalic.  Mouth/Throat: Oropharynx is clear and moist.  Eyes: Pupils are equal, round, and reactive to light. Conjunctivae and EOM are normal.  Neck: Normal range of motion. Neck supple.  Cardiovascular: Normal rate, regular rhythm and normal heart sounds.  Pulmonary/Chest: Effort normal and breath sounds normal. No stridor. No respiratory distress.  Abdominal: Soft. Bowel sounds are normal. She exhibits no distension. There is no tenderness.  Musculoskeletal:  1+ edema bilaterally, venous stasis changes, worse on L side   Neurological: She is alert.  Demented, A & O x 1. Nl strength and sensation throughout   Skin: Skin is warm.  Psychiatric:  Unable   Nursing note and vitals reviewed.    ED Treatments / Results  Labs (all labs ordered are listed, but only abnormal results are displayed) Labs Reviewed  CBC WITH  DIFFERENTIAL/PLATELET - Abnormal; Notable for the following components:      Result Value   Platelets 130 (*)    All other components within normal limits  BASIC METABOLIC PANEL - Abnormal; Notable for the following components:   Potassium 3.1 (*)    Glucose, Bld 151 (*)    All other components within normal limits  URINALYSIS, COMPLETE (UACMP) WITH MICROSCOPIC - Abnormal; Notable for the following components:   Color, Urine STRAW (*)    APPearance CLEAR (*)    Specific Gravity, Urine 1.003 (*)    Bacteria, UA RARE (*)    All other components within normal limits  URINE CULTURE  TROPONIN I  TROPONIN I    EKG None  ED ECG REPORT I, Wandra Arthurs, the attending physician, personally viewed and interpreted this ECG.   Date: 02/06/2018  EKG Time: 18:51 pm  Rate: 76  Rhythm: normal EKG, normal sinus rhythm  Axis: normal  Intervals:right bundle branch block  ST&T Change: nonspecific    Radiology Ct Chest Wo Contrast  Result Date: 02/06/2018 CLINICAL DATA:  11 mm nodular density overlying the right upper lobe not previously seen. EXAM: CT CHEST WITHOUT CONTRAST TECHNIQUE: Multidetector CT imaging of the chest was performed following the standard protocol without IV contrast. COMPARISON:  11/10/2005 and 08/17/2004 CT FINDINGS: Cardiovascular: Cardiomegaly without pericardial effusion or thickening. Coronary arteriosclerosis is noted as well as mild aortic atherosclerosis. No aneurysm. The unenhanced pulmonary vasculature is unremarkable. Mediastinum/Nodes: Moderate-sized hiatal hernia. Patent trachea and mainstem bronchi. No adenopathy. Lungs/Pleura: There are least 3 calcified nodules in the right upper lobe compatible with old granulomatous disease, series 2/20 and series 3/29, the largest measuring approximately 4 mm. These appear stable. A pleural-based 6 mm nodular density in the right middle lobe is identified, series 3/75 with a subpleural 5.4 mm in average nodule in the right  lower lobe, series 3/79. Both of these findings appear stable and chronic. Subpleural areas of atelectasis are noted in both lower lobes, right middle lobe and lingula. No findings to account for the opacity seen on the chest radiograph from earlier today. No bone islands are noted of the ribs in this  location to account for this finding. An azygos fissure is noted with pleural thickening as before. Faint ground-glass opacities are noted in the upper lobes bilaterally, nonspecific but may be reflective of mild pneumonitis/alveolitis hypoventilatory change among some considerations. Upper Abdomen: No acute abnormality. Musculoskeletal: Mild dextroconvex curvature of the midthoracic spine with thoracic spondylosis. No aggressive osseous lesions. Osteoarthritis of the right glenohumeral joint with subchondral cystic change of the included humeral head. IMPRESSION: 1. No findings to account for the opacity seen on the chest radiograph from earlier today. Findings may have represented a summation of overlapping ribs and pulmonary vasculature. 2. Stable right upper, middle and lower lobe pulmonary nodules measuring up to 6 mm in average diameter. Given stability since 2006, no further repeat imaging is believed necessary unless clinical situation warrants reassessment. 3. Moderate-sized hiatal hernia. Aortic Atherosclerosis (ICD10-I70.0). Electronically Signed   By: Ashley Royalty M.D.   On: 02/06/2018 20:58   Dg Chest Port 1 View  Result Date: 02/06/2018 CLINICAL DATA:  Racing heart. EXAM: PORTABLE CHEST 1 VIEW COMPARISON:  May 10, 2017 FINDINGS: Stable hiatal hernia. The heart, hila, mediastinum, and pleura are normal. A nodular density projects over the right upper lung, not seen February 2019. The nodular density is somewhat irregular and measures up to 11 mm. No other acute abnormalities. IMPRESSION: 1. 11 mm nodular density over the right upper lobe, not seen previously. Recommend CT imaging for better  evaluation. 2. No other acute abnormalities. Electronically Signed   By: Dorise Bullion III M.D   On: 02/06/2018 19:43    Procedures Procedures (including critical care time)  Medications Ordered in ED Medications  sodium chloride 0.9 % bolus 500 mL (0 mLs Intravenous Stopped 02/06/18 2013)     Initial Impression / Assessment and Plan / ED Course  I have reviewed the triage vital signs and the nursing notes.  Pertinent labs & imaging results that were available during my care of the patient were reviewed by me and considered in my medical decision making (see chart for details).    Alexis Winters is a 84 y.o. female here with palpitations. Has hx of afib and SVT. I reviewed tracing from EMS and it appeared like SVT. Now back in sinus rhythm. Will get labs, CXR.   10:00 PM Labs unremarkable. Delta trop neg. UA nl. CXR showed possible mass vs lung nodule. CT chest showed small nodule that is stable from previous. Likely had SVT. Stable for dc back to [facility].   Final Clinical Impressions(s) / ED Diagnoses   Final diagnoses:  None    ED Discharge Orders    None       Drenda Freeze, MD 02/06/18 2200

## 2018-02-06 NOTE — ED Triage Notes (Signed)
Per [facility], pt went to nurses station at [facility] ([place]) complaining of her heart racing.  Hx of SVT.  Initially on monitor, pt's heart rate was 160, then self- converted to 70.

## 2018-02-06 NOTE — Discharge Instructions (Signed)
Continue your current meds. You had SVT that resolved   See your cardiologist for follow up   Return to ER if she has palpitations, chest pain, shortness of breath

## 2018-02-07 DIAGNOSIS — M169 Osteoarthritis of hip, unspecified: Secondary | ICD-10-CM | POA: Diagnosis not present

## 2018-02-07 DIAGNOSIS — R21 Rash and other nonspecific skin eruption: Secondary | ICD-10-CM | POA: Diagnosis not present

## 2018-02-07 DIAGNOSIS — M479 Spondylosis, unspecified: Secondary | ICD-10-CM | POA: Diagnosis not present

## 2018-02-07 DIAGNOSIS — R7303 Prediabetes: Secondary | ICD-10-CM | POA: Diagnosis not present

## 2018-02-07 DIAGNOSIS — I5032 Chronic diastolic (congestive) heart failure: Secondary | ICD-10-CM | POA: Diagnosis not present

## 2018-02-07 DIAGNOSIS — I480 Paroxysmal atrial fibrillation: Secondary | ICD-10-CM | POA: Diagnosis not present

## 2018-02-07 DIAGNOSIS — I110 Hypertensive heart disease with heart failure: Secondary | ICD-10-CM | POA: Diagnosis not present

## 2018-02-07 DIAGNOSIS — I340 Nonrheumatic mitral (valve) insufficiency: Secondary | ICD-10-CM | POA: Diagnosis not present

## 2018-02-07 DIAGNOSIS — F0390 Unspecified dementia without behavioral disturbance: Secondary | ICD-10-CM | POA: Diagnosis not present

## 2018-02-08 DIAGNOSIS — I890 Lymphedema, not elsewhere classified: Secondary | ICD-10-CM | POA: Diagnosis not present

## 2018-02-08 DIAGNOSIS — F0390 Unspecified dementia without behavioral disturbance: Secondary | ICD-10-CM | POA: Diagnosis not present

## 2018-02-08 DIAGNOSIS — D0439 Carcinoma in situ of skin of other parts of face: Secondary | ICD-10-CM | POA: Diagnosis not present

## 2018-02-08 DIAGNOSIS — I872 Venous insufficiency (chronic) (peripheral): Secondary | ICD-10-CM | POA: Diagnosis not present

## 2018-02-08 LAB — URINE CULTURE: Culture: 50000 — AB

## 2018-02-09 DIAGNOSIS — F0390 Unspecified dementia without behavioral disturbance: Secondary | ICD-10-CM | POA: Diagnosis not present

## 2018-02-10 DIAGNOSIS — F0390 Unspecified dementia without behavioral disturbance: Secondary | ICD-10-CM | POA: Diagnosis not present

## 2018-02-11 DIAGNOSIS — F0390 Unspecified dementia without behavioral disturbance: Secondary | ICD-10-CM | POA: Diagnosis not present

## 2018-02-12 DIAGNOSIS — F0390 Unspecified dementia without behavioral disturbance: Secondary | ICD-10-CM | POA: Diagnosis not present

## 2018-02-13 DIAGNOSIS — F0390 Unspecified dementia without behavioral disturbance: Secondary | ICD-10-CM | POA: Diagnosis not present

## 2018-02-14 DIAGNOSIS — F0390 Unspecified dementia without behavioral disturbance: Secondary | ICD-10-CM | POA: Diagnosis not present

## 2018-02-15 DIAGNOSIS — F0390 Unspecified dementia without behavioral disturbance: Secondary | ICD-10-CM | POA: Diagnosis not present

## 2018-02-16 DIAGNOSIS — F0390 Unspecified dementia without behavioral disturbance: Secondary | ICD-10-CM | POA: Diagnosis not present

## 2018-02-17 DIAGNOSIS — F0390 Unspecified dementia without behavioral disturbance: Secondary | ICD-10-CM | POA: Diagnosis not present

## 2018-02-18 DIAGNOSIS — F0390 Unspecified dementia without behavioral disturbance: Secondary | ICD-10-CM | POA: Diagnosis not present

## 2018-02-19 DIAGNOSIS — F0390 Unspecified dementia without behavioral disturbance: Secondary | ICD-10-CM | POA: Diagnosis not present

## 2018-02-20 DIAGNOSIS — F0390 Unspecified dementia without behavioral disturbance: Secondary | ICD-10-CM | POA: Diagnosis not present

## 2018-02-21 DIAGNOSIS — F0390 Unspecified dementia without behavioral disturbance: Secondary | ICD-10-CM | POA: Diagnosis not present

## 2018-02-22 DIAGNOSIS — F0390 Unspecified dementia without behavioral disturbance: Secondary | ICD-10-CM | POA: Diagnosis not present

## 2018-02-23 DIAGNOSIS — F0390 Unspecified dementia without behavioral disturbance: Secondary | ICD-10-CM | POA: Diagnosis not present

## 2018-02-24 DIAGNOSIS — F0390 Unspecified dementia without behavioral disturbance: Secondary | ICD-10-CM | POA: Diagnosis not present

## 2018-03-04 DIAGNOSIS — F0390 Unspecified dementia without behavioral disturbance: Secondary | ICD-10-CM | POA: Diagnosis not present

## 2018-03-05 DIAGNOSIS — F0390 Unspecified dementia without behavioral disturbance: Secondary | ICD-10-CM | POA: Diagnosis not present

## 2018-03-06 DIAGNOSIS — F0390 Unspecified dementia without behavioral disturbance: Secondary | ICD-10-CM | POA: Diagnosis not present

## 2018-03-07 DIAGNOSIS — F0390 Unspecified dementia without behavioral disturbance: Secondary | ICD-10-CM | POA: Diagnosis not present

## 2018-03-08 ENCOUNTER — Emergency Department
Admission: EM | Admit: 2018-03-08 | Discharge: 2018-03-08 | Disposition: A | Discharge status: Home / Self Care | Payer: Medicare HMO | Attending: Emergency Medicine | Admitting: Emergency Medicine

## 2018-03-08 ENCOUNTER — Emergency Department: Payer: Medicare HMO

## 2018-03-08 ENCOUNTER — Other Ambulatory Visit: Payer: Self-pay

## 2018-03-08 DIAGNOSIS — R000 Tachycardia, unspecified: Secondary | ICD-10-CM | POA: Diagnosis not present

## 2018-03-08 DIAGNOSIS — Z87891 Personal history of nicotine dependence: Secondary | ICD-10-CM | POA: Diagnosis not present

## 2018-03-08 DIAGNOSIS — I5032 Chronic diastolic (congestive) heart failure: Secondary | ICD-10-CM | POA: Insufficient documentation

## 2018-03-08 DIAGNOSIS — F0390 Unspecified dementia without behavioral disturbance: Secondary | ICD-10-CM | POA: Diagnosis not present

## 2018-03-08 DIAGNOSIS — I110 Hypertensive heart disease with heart failure: Secondary | ICD-10-CM | POA: Diagnosis not present

## 2018-03-08 DIAGNOSIS — Z853 Personal history of malignant neoplasm of breast: Secondary | ICD-10-CM | POA: Insufficient documentation

## 2018-03-08 DIAGNOSIS — I471 Supraventricular tachycardia: Secondary | ICD-10-CM | POA: Diagnosis not present

## 2018-03-08 DIAGNOSIS — R079 Chest pain, unspecified: Secondary | ICD-10-CM | POA: Diagnosis not present

## 2018-03-08 DIAGNOSIS — E876 Hypokalemia: Secondary | ICD-10-CM | POA: Insufficient documentation

## 2018-03-08 DIAGNOSIS — R0789 Other chest pain: Secondary | ICD-10-CM | POA: Diagnosis not present

## 2018-03-08 DIAGNOSIS — I10 Essential (primary) hypertension: Secondary | ICD-10-CM | POA: Diagnosis not present

## 2018-03-08 LAB — URINALYSIS, COMPLETE (UACMP) WITH MICROSCOPIC
BILIRUBIN URINE: NEGATIVE
Bacteria, UA: NONE SEEN
GLUCOSE, UA: NEGATIVE mg/dL
Hgb urine dipstick: NEGATIVE
KETONES UR: NEGATIVE mg/dL
LEUKOCYTES UA: NEGATIVE
NITRITE: NEGATIVE
PH: 7 (ref 5.0–8.0)
Protein, ur: NEGATIVE mg/dL
Specific Gravity, Urine: 1.004 — ABNORMAL LOW (ref 1.005–1.030)

## 2018-03-08 LAB — CBC WITH DIFFERENTIAL/PLATELET
ABS IMMATURE GRANULOCYTES: 0.02 10*3/uL (ref 0.00–0.07)
BASOS PCT: 1 %
Basophils Absolute: 0 10*3/uL (ref 0.0–0.1)
Eosinophils Absolute: 0.2 10*3/uL (ref 0.0–0.5)
Eosinophils Relative: 3 %
HCT: 46.4 % — ABNORMAL HIGH (ref 36.0–46.0)
Hemoglobin: 14.9 g/dL (ref 12.0–15.0)
IMMATURE GRANULOCYTES: 0 %
Lymphocytes Relative: 14 %
Lymphs Abs: 0.9 10*3/uL (ref 0.7–4.0)
MCH: 28.7 pg (ref 26.0–34.0)
MCHC: 32.1 g/dL (ref 30.0–36.0)
MCV: 89.4 fL (ref 80.0–100.0)
MONOS PCT: 8 %
Monocytes Absolute: 0.5 10*3/uL (ref 0.1–1.0)
NEUTROS ABS: 4.7 10*3/uL (ref 1.7–7.7)
NEUTROS PCT: 74 %
PLATELETS: 158 10*3/uL (ref 150–400)
RBC: 5.19 MIL/uL — AB (ref 3.87–5.11)
RDW: 13.3 % (ref 11.5–15.5)
WBC: 6.4 10*3/uL (ref 4.0–10.5)
nRBC: 0 % (ref 0.0–0.2)

## 2018-03-08 LAB — BASIC METABOLIC PANEL
ANION GAP: 12 (ref 5–15)
BUN: 23 mg/dL (ref 8–23)
CALCIUM: 9.2 mg/dL (ref 8.9–10.3)
CO2: 27 mmol/L (ref 22–32)
Chloride: 101 mmol/L (ref 98–111)
Creatinine, Ser: 0.86 mg/dL (ref 0.44–1.00)
Glucose, Bld: 160 mg/dL — ABNORMAL HIGH (ref 70–99)
Potassium: 3.3 mmol/L — ABNORMAL LOW (ref 3.5–5.1)
Sodium: 140 mmol/L (ref 135–145)

## 2018-03-08 LAB — TROPONIN I

## 2018-03-08 MED ORDER — POTASSIUM CHLORIDE CRYS ER 20 MEQ PO TBCR
40.0000 meq | EXTENDED_RELEASE_TABLET | Freq: Once | ORAL | Status: AC
  Administered 2018-03-08: 40 meq via ORAL
  Filled 2018-03-08: qty 2

## 2018-03-08 NOTE — ED Provider Notes (Signed)
[facility] Emergency Department Provider Note  ____________________________________________  Time seen: Approximately 3:24 PM  I have reviewed the triage vital signs and the nursing notes.   HISTORY  Chief Complaint Chest Pain    HPI Alexis Winters is a 84 y.o. female with a history of paroxysmal A. fib and SVT on aspirin only presenting for fast heart rate.  The patient is demented and unable to give any history.  She is accompanied by her daughter who states that approximately once per month, the patient has a nonsustained period of time where she is in SVT and has mild associated chest pain.  This usually self resolves.  Today, the patient began complaining of chest pain and was found to have a heart rate in the 160s, SVT by EMS tracing.  However, upon arrival to the emergency department, the patient had already converted and was found to be in normal sinus rhythm.  She follows with Dr. Katharina Caper for cardiology, and has not had any recent changes in her medications.  Sometimes she has increased chances of having SVT in the setting of a UTI; she has not been complaining of any dysuria, nausea vomiting or fevers or chills..  Past Medical History:  Diagnosis Date  . Allergic rhinitis   . Arrhythmia    a. office to call pt on 09/15/13 for 30 day event monitor  . Atrial fibrillation ([place])    a. h/o PAF, in NSR during admission 09/11/13  . Breast cancer ([place])   . Chronic diastolic CHF (congestive heart failure) ([place])    a. echo 09/12/13 EF 65-78%, grade 1 diastolic dysf, mild LVOT obstr, moderate MR, elevated filling pressure  . Dementia ([place])   . Dyslipidemia   . Enlarged liver   . Frequent UTI   . GERD (gastroesophageal reflux disease)   . Hard of hearing   . Hemorrhoids   . Hypertension   . Hypoglycemia   . Mitral regurgitation    a. moderate MR by echo 09/12/2013  . OA (osteoarthritis)    hip and back  . Orthostatic hypotension   . Phlebitis    l leg  .  Prediabetes     Patient Active Problem List   Diagnosis Date Noted  . TIA (transient ischemic attack) 11/15/2016  . GERD (gastroesophageal reflux disease) 11/15/2016  . Atrial fibrillation ([place])   . SVT (supraventricular tachycardia) ([place]) 08/18/2016  . Dementia without behavioral disturbance ([place]) 08/18/2016  . UTI (urinary tract infection) 05/29/2016  . Hypokalemia 04/28/2016  . Rash and nonspecific skin eruption 04/28/2016  . Bradycardia 04/27/2016  . Atrial flutter, paroxysmal ([place]) 06/22/2015  . Near syncope 01/10/2015  . Neck pain on left side 01/10/2015  . Pulmonary nodule, right 01/10/2015  . Leukocytosis 01/10/2015  . Hypocalcemia 01/10/2015  . Dyspnea 01/10/2015  . Chest pain 09/11/2013  . Bilateral neck pain 09/11/2013  . Angina at rest [facility]) 09/11/2013  . Leg edema 09/05/2013  . Failure to thrive 09/05/2013  . Paroxysmal atrial fibrillation ([place]) 09/05/2013  . Chronic diastolic CHF (congestive heart failure) ([place]) 09/05/2013    Past Surgical History:  Procedure Laterality Date  . ABDOMINAL HYSTERECTOMY    . BLADDER SUSPENSION    . BREAST LUMPECTOMY    . CARDIAC CATHETERIZATION  1980's   [facility]  . FOOT SURGERY     left foot  . INNER EAR SURGERY      Current Outpatient Rx  . Order #: [id number] Class: Historical Med  . Order #: [id number] Class: Historical  Med  . Order #: [id number] Class: Historical Med  . Order #: [id number] Class: Print  . Order #: [id number] Class: Historical Med  . Order #: [id number] Class: Print  . Order #: [id number] Class: Historical Med  . Order #: [id number] Class: Historical Med  . Order #: [id number] Class: Historical Med  . Order #: [id number] Class: Print  . Order #: [id number] Class: Historical Med  . Order #: [id number] Class: Historical Med  . Order #: [id number] Class: Historical Med  . Order #: [id number] Class: Historical Med  . Order #: [id number] Class: Historical Med  . Order #: [id number] Class: Historical Med  . Order #: [id number] Class:  Historical Med  . Order #: [id number] Class: Print  . Order #: [id number] Class: Historical Med  . Order #: [id number] Class: Historical Med  . Order #: [id number] Class: Print  . Order #: [id number] Class: Normal    Allergies Contrast media [iodinated diagnostic agents]; Metoprolol; Pyridium [phenazopyridine hcl]; Levaquin [levofloxacin in d5w]; Macrodantin [nitrofurantoin macrocrystal]; Other; Tape; and Sulfa antibiotics  Family History  Problem Relation Age of Onset  . Cerebral palsy Sister   . Alzheimer's disease Brother   . Hypertension Father   . Heart disease Brother     Social History Social History   Tobacco Use  . Smoking status: Former Research scientist (life sciences)  . Smokeless tobacco: Never Used  Substance Use Topics  . Alcohol use: No  . Drug use: No    Review of Systems Constitutional: No fever/chills.  Lightheadedness or syncope.  At baseline mental status according to the patient's daughter. Eyes: No visual changes. ENT: No sore throat. No congestion or rhinorrhea. Cardiovascular: Positive chest pain.  Today of heart racing.   Respiratory: Denies shortness of breath.  No cough. Gastrointestinal: No abdominal pain.  No nausea, no vomiting.  No diarrhea.  No constipation. Genitourinary: Negative for dysuria. Musculoskeletal: Negative for back pain.  Chronic unchanged lower extremity swelling. Skin: Negative for rash. Neurological: Negative for headaches. No focal numbness, tingling or weakness.     ____________________________________________   PHYSICAL EXAM:  VITAL SIGNS: ED Triage Vitals  Enc Vitals Group     BP 03/08/18 1330 117/62     Pulse Rate 03/08/18 1330 76     Resp 03/08/18 1331 20     Temp 03/08/18 1331 (!) 97.4 F (36.3 C)     Temp src --      SpO2 03/08/18 1330 100 %     Weight 03/08/18 1332 110 lb (49.9 kg)     Height 03/08/18 1332 5\' 2"  (1.575 m)     Head Circumference --      Peak Flow --      Pain Score 03/08/18 1332 0     Pain Loc --      Pain Edu? --       Excl. in [place]? --     Constitutional: The patient is alert but not oriented.  She is able to answer some questions. Eyes: Conjunctivae are normal.  EOMI. No scleral icterus. Head: Atraumatic. Nose: No congestion/rhinnorhea. Mouth/Throat: Mucous membranes are moist.  Neck: No stridor.  Supple.  No JVD.  No meningismus. Cardiovascular: Normal rate, regular rhythm. No murmurs, rubs or gallops.  Rate in the 70s on my exam. Respiratory: Normal respiratory effort.  No accessory muscle use or retractions. Lungs CTAB.  No wheezes, rales or ronchi. Gastrointestinal: Soft, nontender and mildly distended.  No guarding or rebound.  No peritoneal signs. Musculoskeletal: Bilateral symmetric LE edema. No ttp in the calves or palpable cords.  Negative Homan's sign. Neurologic: Alert.  Speech is clear.  Face and smile are symmetric.  EOMI.  Moves all extremities well. Skin:  Skin is warm, dry and intact. No rash noted. Psychiatric: Mood and affect are normal.   ____________________________________________   LABS (all labs ordered are listed, but only abnormal results are displayed)  Labs Reviewed  CBC WITH DIFFERENTIAL/PLATELET - Abnormal; Notable for the following components:      Result Value   RBC 5.19 (*)    HCT 46.4 (*)    All other components within normal limits  BASIC METABOLIC PANEL - Abnormal; Notable for the following components:   Potassium 3.3 (*)    Glucose, Bld 160 (*)    All other components within normal limits  TROPONIN I  URINALYSIS, COMPLETE (UACMP) WITH MICROSCOPIC   ____________________________________________  EKG  ED ECG REPORT I, Anne-Caroline Mariea Clonts, the attending physician, personally viewed and interpreted this ECG.   Date: 03/08/2018  EKG Time: 1532  Rate: 68  Rhythm: normal sinus rhythm  Axis: leftward  Intervals:none  ST&T Change: No STEMI; nonspecific T wave inversion in V1.  Complete resolution of ST depressions that are seen on the EMS  tracings.  ____________________________________________  [id number]  Dg Chest 2 View  Result Date: 03/08/2018 CLINICAL DATA:  Chest pain EXAM: CHEST - 2 VIEW COMPARISON:  02/06/2018, 05/10/2017 FINDINGS: Both lungs are clear. Mild cardiomegaly. The visualized skeletal structures are unremarkable.Hiatal hernia. Aortic atherosclerosis. Mild scoliosis of the spine. IMPRESSION: No active cardiopulmonary disease. Mild cardiomegaly. Electronically Signed   By: Donavan Foil M.D.   On: 03/08/2018 14:42    ____________________________________________   PROCEDURES  Procedure(s) performed: None  Procedures  Critical Care performed: No ____________________________________________   INITIAL IMPRESSION / ASSESSMENT AND PLAN / ED COURSE  Pertinent labs & imaging results that were available during my care of the patient were reviewed by me and considered in my medical decision making (see chart for details).  84 y.o. female with a history of paroxysmal A. fib and paroxysmal SVT that occurs approximately monthly and usually self resolves presenting with an episode of chest pain in the setting of heart rate in the 160s, now resolved.  EMS is strip shows heart rate in the 130s with some mild ST depressions that are likely due to cardiac strain and have resolved on her EKG.  Overall, the patient is hemodynamically stable.  There is no evidence of cardiac abnormality at this time.  Her troponin is negative and her labs show potassium of 3.3, which I will supplement in case this is very mild hypokalemia is inciting her chronic paroxysmal SVT.  UA still pending.  I anticipate discharge home with PMD and cardiology follow-up.  I discussed follow-up instructions as well as return precautions with the patient and her daughter.  ____________________________________________  FINAL CLINICAL IMPRESSION(S) / ED DIAGNOSES  Final diagnoses:  Paroxysmal SVT (supraventricular tachycardia) (HCC)  Hypokalemia          NEW MEDICATIONS STARTED DURING THIS VISIT:  New Prescriptions   No medications on file      Eula Listen, MD 03/08/18 1534

## 2018-03-08 NOTE — ED Triage Notes (Signed)
Pt arrived via ems from [facility] with heart rate in the 160's. Pt was also c/o of chest pain. Upon arrival pt converted back to NSR. Pt has hx of dementia and bilateral swelling in legs. Pt NAD at present.

## 2018-03-08 NOTE — Discharge Instructions (Addendum)
Continue to take all your medications as prescribed.  Please make an appointment with your primary care physician to have your potassium rechecked.  Return to the emergency department for chest pain, shortness of breath, palpitations, fast heart rate, lightheadedness or fainting, fever, or any other symptoms concerning to you.

## 2018-03-09 DIAGNOSIS — F0390 Unspecified dementia without behavioral disturbance: Secondary | ICD-10-CM | POA: Diagnosis not present

## 2018-03-10 DIAGNOSIS — F0390 Unspecified dementia without behavioral disturbance: Secondary | ICD-10-CM | POA: Diagnosis not present

## 2018-03-11 DIAGNOSIS — F0390 Unspecified dementia without behavioral disturbance: Secondary | ICD-10-CM | POA: Diagnosis not present

## 2018-03-12 DIAGNOSIS — F0390 Unspecified dementia without behavioral disturbance: Secondary | ICD-10-CM | POA: Diagnosis not present

## 2018-03-13 DIAGNOSIS — F0390 Unspecified dementia without behavioral disturbance: Secondary | ICD-10-CM | POA: Diagnosis not present

## 2018-03-14 DIAGNOSIS — F0390 Unspecified dementia without behavioral disturbance: Secondary | ICD-10-CM | POA: Diagnosis not present

## 2018-03-15 DIAGNOSIS — M1712 Unilateral primary osteoarthritis, left knee: Secondary | ICD-10-CM | POA: Diagnosis not present

## 2018-03-15 DIAGNOSIS — F0390 Unspecified dementia without behavioral disturbance: Secondary | ICD-10-CM | POA: Diagnosis not present

## 2018-03-16 DIAGNOSIS — F0390 Unspecified dementia without behavioral disturbance: Secondary | ICD-10-CM | POA: Diagnosis not present

## 2018-03-17 DIAGNOSIS — N182 Chronic kidney disease, stage 2 (mild): Secondary | ICD-10-CM | POA: Diagnosis not present

## 2018-03-17 DIAGNOSIS — I480 Paroxysmal atrial fibrillation: Secondary | ICD-10-CM | POA: Diagnosis not present

## 2018-03-17 DIAGNOSIS — K219 Gastro-esophageal reflux disease without esophagitis: Secondary | ICD-10-CM | POA: Diagnosis not present

## 2018-03-17 DIAGNOSIS — I890 Lymphedema, not elsewhere classified: Secondary | ICD-10-CM | POA: Diagnosis not present

## 2018-03-17 DIAGNOSIS — M479 Spondylosis, unspecified: Secondary | ICD-10-CM | POA: Diagnosis not present

## 2018-03-17 DIAGNOSIS — I130 Hypertensive heart and chronic kidney disease with heart failure and stage 1 through stage 4 chronic kidney disease, or unspecified chronic kidney disease: Secondary | ICD-10-CM | POA: Diagnosis not present

## 2018-03-17 DIAGNOSIS — E785 Hyperlipidemia, unspecified: Secondary | ICD-10-CM | POA: Diagnosis not present

## 2018-03-17 DIAGNOSIS — I5032 Chronic diastolic (congestive) heart failure: Secondary | ICD-10-CM | POA: Diagnosis not present

## 2018-03-17 DIAGNOSIS — F0390 Unspecified dementia without behavioral disturbance: Secondary | ICD-10-CM | POA: Diagnosis not present

## 2018-03-18 DIAGNOSIS — F0390 Unspecified dementia without behavioral disturbance: Secondary | ICD-10-CM | POA: Diagnosis not present

## 2018-03-19 DIAGNOSIS — F0390 Unspecified dementia without behavioral disturbance: Secondary | ICD-10-CM | POA: Diagnosis not present

## 2018-03-20 DIAGNOSIS — F0390 Unspecified dementia without behavioral disturbance: Secondary | ICD-10-CM | POA: Diagnosis not present

## 2018-03-21 DIAGNOSIS — F0390 Unspecified dementia without behavioral disturbance: Secondary | ICD-10-CM | POA: Diagnosis not present

## 2018-03-22 DIAGNOSIS — F0390 Unspecified dementia without behavioral disturbance: Secondary | ICD-10-CM | POA: Diagnosis not present

## 2018-03-23 DIAGNOSIS — F0390 Unspecified dementia without behavioral disturbance: Secondary | ICD-10-CM | POA: Diagnosis not present

## 2018-03-24 DIAGNOSIS — F0390 Unspecified dementia without behavioral disturbance: Secondary | ICD-10-CM | POA: Diagnosis not present

## 2018-03-25 DIAGNOSIS — F0390 Unspecified dementia without behavioral disturbance: Secondary | ICD-10-CM | POA: Diagnosis not present

## 2018-03-26 DIAGNOSIS — I130 Hypertensive heart and chronic kidney disease with heart failure and stage 1 through stage 4 chronic kidney disease, or unspecified chronic kidney disease: Secondary | ICD-10-CM | POA: Diagnosis not present

## 2018-03-26 DIAGNOSIS — N182 Chronic kidney disease, stage 2 (mild): Secondary | ICD-10-CM | POA: Diagnosis not present

## 2018-03-26 DIAGNOSIS — F0390 Unspecified dementia without behavioral disturbance: Secondary | ICD-10-CM | POA: Diagnosis not present

## 2018-03-26 DIAGNOSIS — I480 Paroxysmal atrial fibrillation: Secondary | ICD-10-CM | POA: Diagnosis not present

## 2018-03-26 DIAGNOSIS — I5032 Chronic diastolic (congestive) heart failure: Secondary | ICD-10-CM | POA: Diagnosis not present

## 2018-03-26 DIAGNOSIS — E785 Hyperlipidemia, unspecified: Secondary | ICD-10-CM | POA: Diagnosis not present

## 2018-03-26 DIAGNOSIS — K219 Gastro-esophageal reflux disease without esophagitis: Secondary | ICD-10-CM | POA: Diagnosis not present

## 2018-03-26 DIAGNOSIS — I890 Lymphedema, not elsewhere classified: Secondary | ICD-10-CM | POA: Diagnosis not present

## 2018-03-26 DIAGNOSIS — M479 Spondylosis, unspecified: Secondary | ICD-10-CM | POA: Diagnosis not present

## 2018-03-27 ENCOUNTER — Ambulatory Visit (HOSPITAL_COMMUNITY)
Admission: EM | Admit: 2018-03-27 | Discharge: 2018-03-27 | Disposition: A | Discharge status: Home / Self Care | Payer: Medicare HMO | Attending: Family Medicine | Admitting: Family Medicine

## 2018-03-27 ENCOUNTER — Encounter (HOSPITAL_COMMUNITY): Payer: Self-pay | Admitting: Emergency Medicine

## 2018-03-27 ENCOUNTER — Ambulatory Visit (INDEPENDENT_AMBULATORY_CARE_PROVIDER_SITE_OTHER): Payer: Medicare HMO

## 2018-03-27 DIAGNOSIS — R102 Pelvic and perineal pain: Secondary | ICD-10-CM | POA: Insufficient documentation

## 2018-03-27 DIAGNOSIS — F0390 Unspecified dementia without behavioral disturbance: Secondary | ICD-10-CM | POA: Diagnosis not present

## 2018-03-27 DIAGNOSIS — S3993XA Unspecified injury of pelvis, initial encounter: Secondary | ICD-10-CM | POA: Diagnosis not present

## 2018-03-27 LAB — POCT URINALYSIS DIP (DEVICE)
BILIRUBIN URINE: NEGATIVE
GLUCOSE, UA: NEGATIVE mg/dL
HGB URINE DIPSTICK: NEGATIVE
Ketones, ur: NEGATIVE mg/dL
LEUKOCYTES UA: NEGATIVE
Nitrite: NEGATIVE
Protein, ur: NEGATIVE mg/dL
SPECIFIC GRAVITY, URINE: 1.02 (ref 1.005–1.030)
UROBILINOGEN UA: 0.2 mg/dL (ref 0.0–1.0)
pH: 5.5 (ref 5.0–8.0)

## 2018-03-27 MED ORDER — KETOROLAC TROMETHAMINE 30 MG/ML IJ SOLN
INTRAMUSCULAR | Status: AC
  Filled 2018-03-27: qty 1

## 2018-03-27 MED ORDER — KETOROLAC TROMETHAMINE 15 MG/ML IJ SOLN
15.0000 mg | Freq: Once | INTRAMUSCULAR | Status: AC
  Administered 2018-03-27: 15 mg via INTRAVENOUS

## 2018-03-27 MED ORDER — KETOROLAC TROMETHAMINE 30 MG/ML IJ SOLN: 30.0000 mg | Freq: Once | INTRAMUSCULAR | Status: DC

## 2018-03-27 NOTE — Discharge Instructions (Addendum)
Please double dose of furosemide for 3 days Elevate legs at all times when seated MiraLAX daily

## 2018-03-27 NOTE — ED Provider Notes (Signed)
Alexis Winters    CSN: [id number] Arrival date & time: 03/27/18  1859     History   Chief Complaint Chief Complaint  Patient presents with  . Pelvic Pain    HPI Alexis Winters is a 85 y.o. female.   Patient resides in assisted living memory care.  At the onset tonight of right-sided pelvic pain.  Daughter brings her in thinking there may be a urinary tract infection urinalysis is normal  HPI  Past Medical History:  Diagnosis Date  . Allergic rhinitis   . Arrhythmia    a. office to call pt on 09/15/13 for 30 day event monitor  . Atrial fibrillation ([place])    a. h/o PAF, in NSR during admission 09/11/13  . Breast cancer ([place])   . Chronic diastolic CHF (congestive heart failure) ([place])    a. echo 09/12/13 EF 62-83%, grade 1 diastolic dysf, mild LVOT obstr, moderate MR, elevated filling pressure  . Dementia ([place])   . Dyslipidemia   . Enlarged liver   . Frequent UTI   . GERD (gastroesophageal reflux disease)   . Hard of hearing   . Hemorrhoids   . Hypertension   . Hypoglycemia   . Mitral regurgitation    a. moderate MR by echo 09/12/2013  . OA (osteoarthritis)    hip and back  . Orthostatic hypotension   . Phlebitis    l leg  . Prediabetes     Patient Active Problem List   Diagnosis Date Noted  . TIA (transient ischemic attack) 11/15/2016  . GERD (gastroesophageal reflux disease) 11/15/2016  . Atrial fibrillation ([place])   . SVT (supraventricular tachycardia) ([place]) 08/18/2016  . Dementia without behavioral disturbance ([place]) 08/18/2016  . UTI (urinary tract infection) 05/29/2016  . Hypokalemia 04/28/2016  . Rash and nonspecific skin eruption 04/28/2016  . Bradycardia 04/27/2016  . Atrial flutter, paroxysmal ([place]) 06/22/2015  . Near syncope 01/10/2015  . Neck pain on left side 01/10/2015  . Pulmonary nodule, right 01/10/2015  . Leukocytosis 01/10/2015  . Hypocalcemia 01/10/2015  . Dyspnea 01/10/2015  . Chest pain 09/11/2013  . Bilateral neck pain  09/11/2013  . Angina at rest [facility]) 09/11/2013  . Leg edema 09/05/2013  . Failure to thrive 09/05/2013  . Paroxysmal atrial fibrillation ([place]) 09/05/2013  . Chronic diastolic CHF (congestive heart failure) ([place]) 09/05/2013    Past Surgical History:  Procedure Laterality Date  . ABDOMINAL HYSTERECTOMY    . BLADDER SUSPENSION    . BREAST LUMPECTOMY    . CARDIAC CATHETERIZATION  1980's   [facility]  . FOOT SURGERY     left foot  . INNER EAR SURGERY      OB History   No obstetric history on file.      Home Medications    Prior to Admission medications   Medication Sig Start Date End Date Taking? Authorizing Provider  acetaminophen (TYLENOL) 500 MG tablet Take 500 mg by mouth every 6 (six) hours as needed for mild pain, fever or headache.     [provider]  alum & mag hydroxide-simeth ([place]) 200-200-20 MG/5ML suspension Take 30 mLs by mouth every 6 (six) hours as needed for indigestion or heartburn (max 4 doses in 24 hours).     [provider]  aspirin 325 MG tablet Take 1 tablet (325 mg total) by mouth daily. Patient not taking: Reported on 03/08/2018 11/17/16   Dhungel, Flonnie Overman, MD  aspirin 81 MG chewable tablet Chew 81 mg by mouth daily.  [provider]  atorvastatin (LIPITOR) 40 MG tablet Take 1 tablet (40 mg total) by mouth daily at 6 PM. Patient taking differently: Take 40 mg by mouth at bedtime.  11/16/16   Dhungel, Flonnie Overman, MD  augmented betamethasone dipropionate (DIPROLENE-AF) 0.05 % cream Apply 1 application topically 2 (two) times daily as needed (avoid face, groin and underarms).    [provider]  carvedilol (COREG) 3.125 MG tablet Take 1 tablet (3.125 mg total) by mouth 2 (two) times daily. 02/13/17 03/08/18  Nahser, Wonda Cheng, MD  cephALEXin (KEFLEX) 500 MG capsule Take 1 capsule (500 mg total) by mouth 4 (four) times daily. Patient not taking: Reported on 03/08/2018 03/03/17   Janne Napoleon, NP  Cranberry 450 MG TABS Take 450 mg by  mouth 2 (two) times daily.     [provider]  furosemide (LASIX) 40 MG tablet Take 40 mg by mouth 2 (two) times daily.  04/09/12   [provider]  guaifenesin (ROBITUSSIN) 100 MG/5ML syrup Take 200 mg by mouth every 6 (six) hours as needed for cough.    [provider]  isosorbide mononitrate (IMDUR) 30 MG 24 hr tablet Take 1 tablet (30 mg total) by mouth daily. 10/13/15   Nahser, Wonda Cheng, MD  loperamide (IMODIUM) 2 MG capsule Take 2 mg by mouth daily as needed for diarrhea or loose stools (max 8 doses in 24 hours).     [provider]  magnesium hydroxide (MILK OF MAGNESIA) 400 MG/5ML suspension Take 30 mLs by mouth at bedtime as needed for mild constipation.    [provider]  methenamine (MANDELAMINE) 1 g tablet Take 1,000 mg by mouth 2 (two) times daily.    [provider]  mirabegron ER (MYRBETRIQ) 25 MG TB24 tablet Take 25 mg by mouth daily.    [provider]  Neomycin-Bacitracin-Polymyxin (HCA TRIPLE ANTIBIOTIC OINTMENT EX) Apply 1 application topically 4 (four) times daily as needed (skin tears/abrasions).     [provider]  nystatin (NYSTATIN) powder Apply 1 g topically 2 (two) times daily. (apply to skin folds)    [provider]  pantoprazole (PROTONIX) 40 MG tablet Take 40 mg by mouth daily. 10/19/14   [provider]  potassium chloride (K-DUR) 10 MEQ tablet Take 2 tablets (20 mEq total) by mouth 3 (three) times daily. 06/21/16   Sherwood Gambler, MD  triamcinolone cream (KENALOG) 0.1 % Apply 1 application topically 2 (two) times daily.    [provider]  vitamin C (ASCORBIC ACID) 500 MG tablet Take 500 mg by mouth daily.    [provider]    Family History Family History  Problem Relation Age of Onset  . Cerebral palsy Sister   . Alzheimer's disease Brother   . Hypertension Father   . Heart disease Brother     Social History Social History   Tobacco Use  .  Smoking status: Former Research scientist (life sciences)  . Smokeless tobacco: Never Used  Substance Use Topics  . Alcohol use: No  . Drug use: No     Allergies   Contrast media [iodinated diagnostic agents]; Metoprolol; Pyridium [phenazopyridine hcl]; Levaquin [levofloxacin in d5w]; Macrodantin [nitrofurantoin macrocrystal]; Other; Tape; and Sulfa antibiotics   Review of Systems Review of Systems  Constitutional: Negative.   HENT: Negative.   Genitourinary: Positive for pelvic pain.  Musculoskeletal: Positive for gait problem.  Psychiatric/Behavioral: Positive for confusion.     Physical Exam Triage Vital Signs ED Triage Vitals [03/27/18 1946]  Enc Vitals Group  BP (!) 152/69     Pulse Rate 64     Resp 20     Temp 97.8 F (36.6 C)     Temp src      SpO2 99 %     Weight      Height      Head Circumference      Peak Flow      Pain Score      Pain Loc      Pain Edu?      Excl. in [place]?    No data found.  Updated Vital Signs BP (!) 152/69   Pulse 64   Temp 97.8 F (36.6 C)   Resp 20   SpO2 99%   Visual Acuity Right Eye Distance:   Left Eye Distance:   Bilateral Distance:    Right Eye Near:   Left Eye Near:    Bilateral Near:     Physical Exam Constitutional:      Appearance: She is ill-appearing.  Cardiovascular:     Rate and Rhythm: Normal rate and regular rhythm.  Pulmonary:     Effort: Pulmonary effort is normal.     Breath sounds: Normal breath sounds.  Abdominal:     General: Bowel sounds are normal. There is distension.  Musculoskeletal:     Right lower leg: Edema present.     Left lower leg: Edema present.     Comments: Tender over right side of pelvis  There is marked edema in both lower extremities with stasis changes  There is no history of falls.  Daughter says that she can ambulate with walker but looking at her legs, this is hard to imagine  Neurological:     Gait: Gait abnormal.     Comments: Patient oriented to self only      UC Treatments /  Results  Labs (all labs ordered are listed, but only abnormal results are displayed) Labs Reviewed  POCT URINALYSIS DIP (DEVICE)    EKG None  Radiology No results found.  Procedures Procedures (including critical care time)  Medications Ordered in UC Medications - No data to display  Initial Impression / Assessment and Plan / UC Course  I have reviewed the triage vital signs and the nursing notes.  Pertinent labs & imaging results that were available during my care of the patient were reviewed by me and considered in my medical decision making (see chart for details).     Pelvic pain, uncertain etiology.  Both AP x-ray of the pelvis and urinalysis are negative.  Will treat pain with low-dose Toradol.  Have also suggested doubling Lasix for 3 days to help remove some of the edema in her lower legs.  Of also suggested MiraLAX when she gets back to facility Final Clinical Impressions(s) / UC Diagnoses   Final diagnoses:  None   Discharge Instructions   None    ED Prescriptions    None     Controlled Substance Prescriptions [place] Controlled Substance Registry consulted? No   Wardell Honour, MD 03/27/18 2043

## 2018-03-27 NOTE — ED Triage Notes (Signed)
Pt c/o lower pelvic pain, family member with her. Pt from memory care unit.

## 2018-03-28 DIAGNOSIS — F0390 Unspecified dementia without behavioral disturbance: Secondary | ICD-10-CM | POA: Diagnosis not present

## 2018-03-28 DIAGNOSIS — N182 Chronic kidney disease, stage 2 (mild): Secondary | ICD-10-CM | POA: Diagnosis not present

## 2018-03-28 DIAGNOSIS — K219 Gastro-esophageal reflux disease without esophagitis: Secondary | ICD-10-CM | POA: Diagnosis not present

## 2018-03-28 DIAGNOSIS — I890 Lymphedema, not elsewhere classified: Secondary | ICD-10-CM | POA: Diagnosis not present

## 2018-03-28 DIAGNOSIS — I5032 Chronic diastolic (congestive) heart failure: Secondary | ICD-10-CM | POA: Diagnosis not present

## 2018-03-28 DIAGNOSIS — I480 Paroxysmal atrial fibrillation: Secondary | ICD-10-CM | POA: Diagnosis not present

## 2018-03-28 DIAGNOSIS — I130 Hypertensive heart and chronic kidney disease with heart failure and stage 1 through stage 4 chronic kidney disease, or unspecified chronic kidney disease: Secondary | ICD-10-CM | POA: Diagnosis not present

## 2018-03-28 DIAGNOSIS — E785 Hyperlipidemia, unspecified: Secondary | ICD-10-CM | POA: Diagnosis not present

## 2018-03-28 DIAGNOSIS — M479 Spondylosis, unspecified: Secondary | ICD-10-CM | POA: Diagnosis not present

## 2018-03-29 DIAGNOSIS — I890 Lymphedema, not elsewhere classified: Secondary | ICD-10-CM | POA: Diagnosis not present

## 2018-03-29 DIAGNOSIS — F0390 Unspecified dementia without behavioral disturbance: Secondary | ICD-10-CM | POA: Diagnosis not present

## 2018-03-29 DIAGNOSIS — N182 Chronic kidney disease, stage 2 (mild): Secondary | ICD-10-CM | POA: Diagnosis not present

## 2018-03-29 DIAGNOSIS — E785 Hyperlipidemia, unspecified: Secondary | ICD-10-CM | POA: Diagnosis not present

## 2018-03-29 DIAGNOSIS — I130 Hypertensive heart and chronic kidney disease with heart failure and stage 1 through stage 4 chronic kidney disease, or unspecified chronic kidney disease: Secondary | ICD-10-CM | POA: Diagnosis not present

## 2018-03-29 DIAGNOSIS — I480 Paroxysmal atrial fibrillation: Secondary | ICD-10-CM | POA: Diagnosis not present

## 2018-03-29 DIAGNOSIS — I5032 Chronic diastolic (congestive) heart failure: Secondary | ICD-10-CM | POA: Diagnosis not present

## 2018-03-29 DIAGNOSIS — K219 Gastro-esophageal reflux disease without esophagitis: Secondary | ICD-10-CM | POA: Diagnosis not present

## 2018-03-29 DIAGNOSIS — M479 Spondylosis, unspecified: Secondary | ICD-10-CM | POA: Diagnosis not present

## 2018-03-30 DIAGNOSIS — F0390 Unspecified dementia without behavioral disturbance: Secondary | ICD-10-CM | POA: Diagnosis not present

## 2018-03-31 DIAGNOSIS — K219 Gastro-esophageal reflux disease without esophagitis: Secondary | ICD-10-CM | POA: Diagnosis not present

## 2018-03-31 DIAGNOSIS — E785 Hyperlipidemia, unspecified: Secondary | ICD-10-CM | POA: Diagnosis not present

## 2018-03-31 DIAGNOSIS — M479 Spondylosis, unspecified: Secondary | ICD-10-CM | POA: Diagnosis not present

## 2018-03-31 DIAGNOSIS — I890 Lymphedema, not elsewhere classified: Secondary | ICD-10-CM | POA: Diagnosis not present

## 2018-03-31 DIAGNOSIS — I5032 Chronic diastolic (congestive) heart failure: Secondary | ICD-10-CM | POA: Diagnosis not present

## 2018-03-31 DIAGNOSIS — N182 Chronic kidney disease, stage 2 (mild): Secondary | ICD-10-CM | POA: Diagnosis not present

## 2018-03-31 DIAGNOSIS — I480 Paroxysmal atrial fibrillation: Secondary | ICD-10-CM | POA: Diagnosis not present

## 2018-03-31 DIAGNOSIS — F0390 Unspecified dementia without behavioral disturbance: Secondary | ICD-10-CM | POA: Diagnosis not present

## 2018-03-31 DIAGNOSIS — I130 Hypertensive heart and chronic kidney disease with heart failure and stage 1 through stage 4 chronic kidney disease, or unspecified chronic kidney disease: Secondary | ICD-10-CM | POA: Diagnosis not present

## 2018-04-01 DIAGNOSIS — K219 Gastro-esophageal reflux disease without esophagitis: Secondary | ICD-10-CM | POA: Diagnosis not present

## 2018-04-01 DIAGNOSIS — I480 Paroxysmal atrial fibrillation: Secondary | ICD-10-CM | POA: Diagnosis not present

## 2018-04-01 DIAGNOSIS — E785 Hyperlipidemia, unspecified: Secondary | ICD-10-CM | POA: Diagnosis not present

## 2018-04-01 DIAGNOSIS — I130 Hypertensive heart and chronic kidney disease with heart failure and stage 1 through stage 4 chronic kidney disease, or unspecified chronic kidney disease: Secondary | ICD-10-CM | POA: Diagnosis not present

## 2018-04-01 DIAGNOSIS — M479 Spondylosis, unspecified: Secondary | ICD-10-CM | POA: Diagnosis not present

## 2018-04-01 DIAGNOSIS — N182 Chronic kidney disease, stage 2 (mild): Secondary | ICD-10-CM | POA: Diagnosis not present

## 2018-04-01 DIAGNOSIS — I890 Lymphedema, not elsewhere classified: Secondary | ICD-10-CM | POA: Diagnosis not present

## 2018-04-01 DIAGNOSIS — I5032 Chronic diastolic (congestive) heart failure: Secondary | ICD-10-CM | POA: Diagnosis not present

## 2018-04-01 DIAGNOSIS — F0390 Unspecified dementia without behavioral disturbance: Secondary | ICD-10-CM | POA: Diagnosis not present

## 2018-04-02 DIAGNOSIS — F0390 Unspecified dementia without behavioral disturbance: Secondary | ICD-10-CM | POA: Diagnosis not present

## 2018-04-03 DIAGNOSIS — F0390 Unspecified dementia without behavioral disturbance: Secondary | ICD-10-CM | POA: Diagnosis not present

## 2018-04-04 DIAGNOSIS — I480 Paroxysmal atrial fibrillation: Secondary | ICD-10-CM | POA: Diagnosis not present

## 2018-04-04 DIAGNOSIS — I5032 Chronic diastolic (congestive) heart failure: Secondary | ICD-10-CM | POA: Diagnosis not present

## 2018-04-04 DIAGNOSIS — K219 Gastro-esophageal reflux disease without esophagitis: Secondary | ICD-10-CM | POA: Diagnosis not present

## 2018-04-04 DIAGNOSIS — M479 Spondylosis, unspecified: Secondary | ICD-10-CM | POA: Diagnosis not present

## 2018-04-04 DIAGNOSIS — I130 Hypertensive heart and chronic kidney disease with heart failure and stage 1 through stage 4 chronic kidney disease, or unspecified chronic kidney disease: Secondary | ICD-10-CM | POA: Diagnosis not present

## 2018-04-04 DIAGNOSIS — I890 Lymphedema, not elsewhere classified: Secondary | ICD-10-CM | POA: Diagnosis not present

## 2018-04-04 DIAGNOSIS — N182 Chronic kidney disease, stage 2 (mild): Secondary | ICD-10-CM | POA: Diagnosis not present

## 2018-04-04 DIAGNOSIS — F0390 Unspecified dementia without behavioral disturbance: Secondary | ICD-10-CM | POA: Diagnosis not present

## 2018-04-04 DIAGNOSIS — E785 Hyperlipidemia, unspecified: Secondary | ICD-10-CM | POA: Diagnosis not present

## 2018-04-05 DIAGNOSIS — F0390 Unspecified dementia without behavioral disturbance: Secondary | ICD-10-CM | POA: Diagnosis not present

## 2018-04-06 DIAGNOSIS — F0390 Unspecified dementia without behavioral disturbance: Secondary | ICD-10-CM | POA: Diagnosis not present

## 2018-04-07 DIAGNOSIS — F0390 Unspecified dementia without behavioral disturbance: Secondary | ICD-10-CM | POA: Diagnosis not present

## 2018-04-08 DIAGNOSIS — K219 Gastro-esophageal reflux disease without esophagitis: Secondary | ICD-10-CM | POA: Diagnosis not present

## 2018-04-08 DIAGNOSIS — I5032 Chronic diastolic (congestive) heart failure: Secondary | ICD-10-CM | POA: Diagnosis not present

## 2018-04-08 DIAGNOSIS — E785 Hyperlipidemia, unspecified: Secondary | ICD-10-CM | POA: Diagnosis not present

## 2018-04-08 DIAGNOSIS — I480 Paroxysmal atrial fibrillation: Secondary | ICD-10-CM | POA: Diagnosis not present

## 2018-04-08 DIAGNOSIS — N182 Chronic kidney disease, stage 2 (mild): Secondary | ICD-10-CM | POA: Diagnosis not present

## 2018-04-08 DIAGNOSIS — F0390 Unspecified dementia without behavioral disturbance: Secondary | ICD-10-CM | POA: Diagnosis not present

## 2018-04-08 DIAGNOSIS — I890 Lymphedema, not elsewhere classified: Secondary | ICD-10-CM | POA: Diagnosis not present

## 2018-04-08 DIAGNOSIS — I130 Hypertensive heart and chronic kidney disease with heart failure and stage 1 through stage 4 chronic kidney disease, or unspecified chronic kidney disease: Secondary | ICD-10-CM | POA: Diagnosis not present

## 2018-04-08 DIAGNOSIS — M479 Spondylosis, unspecified: Secondary | ICD-10-CM | POA: Diagnosis not present

## 2018-04-09 DIAGNOSIS — F0390 Unspecified dementia without behavioral disturbance: Secondary | ICD-10-CM | POA: Diagnosis not present

## 2018-04-10 DIAGNOSIS — N182 Chronic kidney disease, stage 2 (mild): Secondary | ICD-10-CM | POA: Diagnosis not present

## 2018-04-10 DIAGNOSIS — I130 Hypertensive heart and chronic kidney disease with heart failure and stage 1 through stage 4 chronic kidney disease, or unspecified chronic kidney disease: Secondary | ICD-10-CM | POA: Diagnosis not present

## 2018-04-10 DIAGNOSIS — K219 Gastro-esophageal reflux disease without esophagitis: Secondary | ICD-10-CM | POA: Diagnosis not present

## 2018-04-10 DIAGNOSIS — I480 Paroxysmal atrial fibrillation: Secondary | ICD-10-CM | POA: Diagnosis not present

## 2018-04-10 DIAGNOSIS — I890 Lymphedema, not elsewhere classified: Secondary | ICD-10-CM | POA: Diagnosis not present

## 2018-04-10 DIAGNOSIS — F0390 Unspecified dementia without behavioral disturbance: Secondary | ICD-10-CM | POA: Diagnosis not present

## 2018-04-10 DIAGNOSIS — I5032 Chronic diastolic (congestive) heart failure: Secondary | ICD-10-CM | POA: Diagnosis not present

## 2018-04-10 DIAGNOSIS — E785 Hyperlipidemia, unspecified: Secondary | ICD-10-CM | POA: Diagnosis not present

## 2018-04-10 DIAGNOSIS — M479 Spondylosis, unspecified: Secondary | ICD-10-CM | POA: Diagnosis not present

## 2018-04-11 DIAGNOSIS — F0390 Unspecified dementia without behavioral disturbance: Secondary | ICD-10-CM | POA: Diagnosis not present

## 2018-04-15 DIAGNOSIS — Z66 Do not resuscitate: Secondary | ICD-10-CM | POA: Diagnosis not present

## 2018-04-15 DIAGNOSIS — L03119 Cellulitis of unspecified part of limb: Secondary | ICD-10-CM | POA: Diagnosis not present

## 2018-04-15 DIAGNOSIS — I890 Lymphedema, not elsewhere classified: Secondary | ICD-10-CM | POA: Diagnosis not present

## 2018-04-15 DIAGNOSIS — G301 Alzheimer's disease with late onset: Secondary | ICD-10-CM | POA: Diagnosis not present

## 2018-04-17 DIAGNOSIS — E785 Hyperlipidemia, unspecified: Secondary | ICD-10-CM | POA: Diagnosis not present

## 2018-04-17 DIAGNOSIS — K219 Gastro-esophageal reflux disease without esophagitis: Secondary | ICD-10-CM | POA: Diagnosis not present

## 2018-04-17 DIAGNOSIS — I5032 Chronic diastolic (congestive) heart failure: Secondary | ICD-10-CM | POA: Diagnosis not present

## 2018-04-17 DIAGNOSIS — F0390 Unspecified dementia without behavioral disturbance: Secondary | ICD-10-CM | POA: Diagnosis not present

## 2018-04-17 DIAGNOSIS — M479 Spondylosis, unspecified: Secondary | ICD-10-CM | POA: Diagnosis not present

## 2018-04-17 DIAGNOSIS — N182 Chronic kidney disease, stage 2 (mild): Secondary | ICD-10-CM | POA: Diagnosis not present

## 2018-04-17 DIAGNOSIS — I130 Hypertensive heart and chronic kidney disease with heart failure and stage 1 through stage 4 chronic kidney disease, or unspecified chronic kidney disease: Secondary | ICD-10-CM | POA: Diagnosis not present

## 2018-04-17 DIAGNOSIS — I890 Lymphedema, not elsewhere classified: Secondary | ICD-10-CM | POA: Diagnosis not present

## 2018-04-17 DIAGNOSIS — I480 Paroxysmal atrial fibrillation: Secondary | ICD-10-CM | POA: Diagnosis not present

## 2018-04-22 DIAGNOSIS — I10 Essential (primary) hypertension: Secondary | ICD-10-CM | POA: Diagnosis not present

## 2018-04-23 DIAGNOSIS — I10 Essential (primary) hypertension: Secondary | ICD-10-CM | POA: Diagnosis not present

## 2018-04-24 DIAGNOSIS — I10 Essential (primary) hypertension: Secondary | ICD-10-CM | POA: Diagnosis not present

## 2018-04-25 DIAGNOSIS — I10 Essential (primary) hypertension: Secondary | ICD-10-CM | POA: Diagnosis not present

## 2018-04-26 DIAGNOSIS — I10 Essential (primary) hypertension: Secondary | ICD-10-CM | POA: Diagnosis not present

## 2018-04-27 DIAGNOSIS — I10 Essential (primary) hypertension: Secondary | ICD-10-CM | POA: Diagnosis not present

## 2018-04-28 DIAGNOSIS — I10 Essential (primary) hypertension: Secondary | ICD-10-CM | POA: Diagnosis not present

## 2018-04-29 DIAGNOSIS — K219 Gastro-esophageal reflux disease without esophagitis: Secondary | ICD-10-CM | POA: Diagnosis not present

## 2018-04-29 DIAGNOSIS — F0390 Unspecified dementia without behavioral disturbance: Secondary | ICD-10-CM | POA: Diagnosis not present

## 2018-04-29 DIAGNOSIS — M479 Spondylosis, unspecified: Secondary | ICD-10-CM | POA: Diagnosis not present

## 2018-04-29 DIAGNOSIS — L03119 Cellulitis of unspecified part of limb: Secondary | ICD-10-CM | POA: Diagnosis not present

## 2018-04-29 DIAGNOSIS — I890 Lymphedema, not elsewhere classified: Secondary | ICD-10-CM | POA: Diagnosis not present

## 2018-04-29 DIAGNOSIS — I130 Hypertensive heart and chronic kidney disease with heart failure and stage 1 through stage 4 chronic kidney disease, or unspecified chronic kidney disease: Secondary | ICD-10-CM | POA: Diagnosis not present

## 2018-04-29 DIAGNOSIS — G301 Alzheimer's disease with late onset: Secondary | ICD-10-CM | POA: Diagnosis not present

## 2018-04-29 DIAGNOSIS — I480 Paroxysmal atrial fibrillation: Secondary | ICD-10-CM | POA: Diagnosis not present

## 2018-04-29 DIAGNOSIS — Z66 Do not resuscitate: Secondary | ICD-10-CM | POA: Diagnosis not present

## 2018-04-29 DIAGNOSIS — E785 Hyperlipidemia, unspecified: Secondary | ICD-10-CM | POA: Diagnosis not present

## 2018-04-29 DIAGNOSIS — I5032 Chronic diastolic (congestive) heart failure: Secondary | ICD-10-CM | POA: Diagnosis not present

## 2018-04-29 DIAGNOSIS — N182 Chronic kidney disease, stage 2 (mild): Secondary | ICD-10-CM | POA: Diagnosis not present

## 2018-04-29 DIAGNOSIS — I10 Essential (primary) hypertension: Secondary | ICD-10-CM | POA: Diagnosis not present

## 2018-04-30 DIAGNOSIS — I10 Essential (primary) hypertension: Secondary | ICD-10-CM | POA: Diagnosis not present

## 2018-05-01 DIAGNOSIS — I10 Essential (primary) hypertension: Secondary | ICD-10-CM | POA: Diagnosis not present

## 2018-05-02 DIAGNOSIS — M479 Spondylosis, unspecified: Secondary | ICD-10-CM | POA: Diagnosis not present

## 2018-05-02 DIAGNOSIS — I480 Paroxysmal atrial fibrillation: Secondary | ICD-10-CM | POA: Diagnosis not present

## 2018-05-02 DIAGNOSIS — I890 Lymphedema, not elsewhere classified: Secondary | ICD-10-CM | POA: Diagnosis not present

## 2018-05-02 DIAGNOSIS — I130 Hypertensive heart and chronic kidney disease with heart failure and stage 1 through stage 4 chronic kidney disease, or unspecified chronic kidney disease: Secondary | ICD-10-CM | POA: Diagnosis not present

## 2018-05-02 DIAGNOSIS — K219 Gastro-esophageal reflux disease without esophagitis: Secondary | ICD-10-CM | POA: Diagnosis not present

## 2018-05-02 DIAGNOSIS — I5032 Chronic diastolic (congestive) heart failure: Secondary | ICD-10-CM | POA: Diagnosis not present

## 2018-05-02 DIAGNOSIS — I10 Essential (primary) hypertension: Secondary | ICD-10-CM | POA: Diagnosis not present

## 2018-05-02 DIAGNOSIS — F0390 Unspecified dementia without behavioral disturbance: Secondary | ICD-10-CM | POA: Diagnosis not present

## 2018-05-02 DIAGNOSIS — E785 Hyperlipidemia, unspecified: Secondary | ICD-10-CM | POA: Diagnosis not present

## 2018-05-02 DIAGNOSIS — N182 Chronic kidney disease, stage 2 (mild): Secondary | ICD-10-CM | POA: Diagnosis not present

## 2018-05-03 DIAGNOSIS — I872 Venous insufficiency (chronic) (peripheral): Secondary | ICD-10-CM | POA: Diagnosis not present

## 2018-05-03 DIAGNOSIS — E440 Moderate protein-calorie malnutrition: Secondary | ICD-10-CM | POA: Diagnosis not present

## 2018-05-03 DIAGNOSIS — E785 Hyperlipidemia, unspecified: Secondary | ICD-10-CM | POA: Diagnosis not present

## 2018-05-03 DIAGNOSIS — I5030 Unspecified diastolic (congestive) heart failure: Secondary | ICD-10-CM | POA: Diagnosis not present

## 2018-05-03 DIAGNOSIS — I10 Essential (primary) hypertension: Secondary | ICD-10-CM | POA: Diagnosis not present

## 2018-05-03 DIAGNOSIS — R269 Unspecified abnormalities of gait and mobility: Secondary | ICD-10-CM | POA: Diagnosis not present

## 2018-05-03 DIAGNOSIS — M170 Bilateral primary osteoarthritis of knee: Secondary | ICD-10-CM | POA: Diagnosis not present

## 2018-05-03 DIAGNOSIS — F0390 Unspecified dementia without behavioral disturbance: Secondary | ICD-10-CM | POA: Diagnosis not present

## 2018-05-04 DIAGNOSIS — I10 Essential (primary) hypertension: Secondary | ICD-10-CM | POA: Diagnosis not present

## 2018-05-05 DIAGNOSIS — I10 Essential (primary) hypertension: Secondary | ICD-10-CM | POA: Diagnosis not present

## 2018-05-06 DIAGNOSIS — I10 Essential (primary) hypertension: Secondary | ICD-10-CM | POA: Diagnosis not present

## 2018-05-07 DIAGNOSIS — I10 Essential (primary) hypertension: Secondary | ICD-10-CM | POA: Diagnosis not present

## 2018-05-08 DIAGNOSIS — I10 Essential (primary) hypertension: Secondary | ICD-10-CM | POA: Diagnosis not present

## 2018-05-09 DIAGNOSIS — I890 Lymphedema, not elsewhere classified: Secondary | ICD-10-CM | POA: Diagnosis not present

## 2018-05-09 DIAGNOSIS — K219 Gastro-esophageal reflux disease without esophagitis: Secondary | ICD-10-CM | POA: Diagnosis not present

## 2018-05-09 DIAGNOSIS — I480 Paroxysmal atrial fibrillation: Secondary | ICD-10-CM | POA: Diagnosis not present

## 2018-05-09 DIAGNOSIS — E785 Hyperlipidemia, unspecified: Secondary | ICD-10-CM | POA: Diagnosis not present

## 2018-05-09 DIAGNOSIS — N182 Chronic kidney disease, stage 2 (mild): Secondary | ICD-10-CM | POA: Diagnosis not present

## 2018-05-09 DIAGNOSIS — I10 Essential (primary) hypertension: Secondary | ICD-10-CM | POA: Diagnosis not present

## 2018-05-09 DIAGNOSIS — I130 Hypertensive heart and chronic kidney disease with heart failure and stage 1 through stage 4 chronic kidney disease, or unspecified chronic kidney disease: Secondary | ICD-10-CM | POA: Diagnosis not present

## 2018-05-09 DIAGNOSIS — F0390 Unspecified dementia without behavioral disturbance: Secondary | ICD-10-CM | POA: Diagnosis not present

## 2018-05-09 DIAGNOSIS — I5032 Chronic diastolic (congestive) heart failure: Secondary | ICD-10-CM | POA: Diagnosis not present

## 2018-05-09 DIAGNOSIS — M479 Spondylosis, unspecified: Secondary | ICD-10-CM | POA: Diagnosis not present

## 2018-05-10 DIAGNOSIS — I10 Essential (primary) hypertension: Secondary | ICD-10-CM | POA: Diagnosis not present

## 2018-05-11 DIAGNOSIS — I10 Essential (primary) hypertension: Secondary | ICD-10-CM | POA: Diagnosis not present

## 2018-05-12 DIAGNOSIS — I10 Essential (primary) hypertension: Secondary | ICD-10-CM | POA: Diagnosis not present

## 2018-05-13 DIAGNOSIS — I10 Essential (primary) hypertension: Secondary | ICD-10-CM | POA: Diagnosis not present

## 2018-05-14 DIAGNOSIS — I10 Essential (primary) hypertension: Secondary | ICD-10-CM | POA: Diagnosis not present

## 2018-05-15 DIAGNOSIS — N182 Chronic kidney disease, stage 2 (mild): Secondary | ICD-10-CM | POA: Diagnosis not present

## 2018-05-15 DIAGNOSIS — M479 Spondylosis, unspecified: Secondary | ICD-10-CM | POA: Diagnosis not present

## 2018-05-15 DIAGNOSIS — I130 Hypertensive heart and chronic kidney disease with heart failure and stage 1 through stage 4 chronic kidney disease, or unspecified chronic kidney disease: Secondary | ICD-10-CM | POA: Diagnosis not present

## 2018-05-15 DIAGNOSIS — E785 Hyperlipidemia, unspecified: Secondary | ICD-10-CM | POA: Diagnosis not present

## 2018-05-15 DIAGNOSIS — K219 Gastro-esophageal reflux disease without esophagitis: Secondary | ICD-10-CM | POA: Diagnosis not present

## 2018-05-15 DIAGNOSIS — I480 Paroxysmal atrial fibrillation: Secondary | ICD-10-CM | POA: Diagnosis not present

## 2018-05-15 DIAGNOSIS — I890 Lymphedema, not elsewhere classified: Secondary | ICD-10-CM | POA: Diagnosis not present

## 2018-05-15 DIAGNOSIS — I10 Essential (primary) hypertension: Secondary | ICD-10-CM | POA: Diagnosis not present

## 2018-05-15 DIAGNOSIS — I5032 Chronic diastolic (congestive) heart failure: Secondary | ICD-10-CM | POA: Diagnosis not present

## 2018-05-15 DIAGNOSIS — F0390 Unspecified dementia without behavioral disturbance: Secondary | ICD-10-CM | POA: Diagnosis not present

## 2018-05-16 DIAGNOSIS — I10 Essential (primary) hypertension: Secondary | ICD-10-CM | POA: Diagnosis not present

## 2018-05-17 DIAGNOSIS — I10 Essential (primary) hypertension: Secondary | ICD-10-CM | POA: Diagnosis not present

## 2018-05-18 DIAGNOSIS — I10 Essential (primary) hypertension: Secondary | ICD-10-CM | POA: Diagnosis not present

## 2018-05-19 DIAGNOSIS — I10 Essential (primary) hypertension: Secondary | ICD-10-CM | POA: Diagnosis not present

## 2018-05-20 DIAGNOSIS — G301 Alzheimer's disease with late onset: Secondary | ICD-10-CM | POA: Diagnosis not present

## 2018-05-20 DIAGNOSIS — Z66 Do not resuscitate: Secondary | ICD-10-CM | POA: Diagnosis not present

## 2018-05-20 DIAGNOSIS — I10 Essential (primary) hypertension: Secondary | ICD-10-CM | POA: Diagnosis not present

## 2018-05-20 DIAGNOSIS — I890 Lymphedema, not elsewhere classified: Secondary | ICD-10-CM | POA: Diagnosis not present

## 2018-05-20 DIAGNOSIS — L0390 Cellulitis, unspecified: Secondary | ICD-10-CM | POA: Diagnosis not present

## 2018-05-21 DIAGNOSIS — I10 Essential (primary) hypertension: Secondary | ICD-10-CM | POA: Diagnosis not present

## 2018-05-22 DIAGNOSIS — I10 Essential (primary) hypertension: Secondary | ICD-10-CM | POA: Diagnosis not present

## 2018-05-23 DIAGNOSIS — I10 Essential (primary) hypertension: Secondary | ICD-10-CM | POA: Diagnosis not present

## 2018-05-24 DIAGNOSIS — I10 Essential (primary) hypertension: Secondary | ICD-10-CM | POA: Diagnosis not present

## 2018-05-25 DIAGNOSIS — I10 Essential (primary) hypertension: Secondary | ICD-10-CM | POA: Diagnosis not present

## 2018-05-26 DIAGNOSIS — I10 Essential (primary) hypertension: Secondary | ICD-10-CM | POA: Diagnosis not present

## 2018-05-27 DIAGNOSIS — I890 Lymphedema, not elsewhere classified: Secondary | ICD-10-CM | POA: Diagnosis not present

## 2018-05-27 DIAGNOSIS — G301 Alzheimer's disease with late onset: Secondary | ICD-10-CM | POA: Diagnosis not present

## 2018-05-27 DIAGNOSIS — I10 Essential (primary) hypertension: Secondary | ICD-10-CM | POA: Diagnosis not present

## 2018-05-27 DIAGNOSIS — Z872 Personal history of diseases of the skin and subcutaneous tissue: Secondary | ICD-10-CM | POA: Diagnosis not present

## 2018-05-27 DIAGNOSIS — Z66 Do not resuscitate: Secondary | ICD-10-CM | POA: Diagnosis not present

## 2018-05-28 DIAGNOSIS — I10 Essential (primary) hypertension: Secondary | ICD-10-CM | POA: Diagnosis not present

## 2018-05-29 DIAGNOSIS — I10 Essential (primary) hypertension: Secondary | ICD-10-CM | POA: Diagnosis not present

## 2018-05-30 DIAGNOSIS — I10 Essential (primary) hypertension: Secondary | ICD-10-CM | POA: Diagnosis not present

## 2018-05-31 DIAGNOSIS — I10 Essential (primary) hypertension: Secondary | ICD-10-CM | POA: Diagnosis not present

## 2018-06-01 DIAGNOSIS — I10 Essential (primary) hypertension: Secondary | ICD-10-CM | POA: Diagnosis not present

## 2018-06-02 DIAGNOSIS — I10 Essential (primary) hypertension: Secondary | ICD-10-CM | POA: Diagnosis not present

## 2018-06-03 ENCOUNTER — Observation Stay (HOSPITAL_COMMUNITY)
Admission: EM | Admit: 2018-06-03 | Discharge: 2018-06-05 | Disposition: A | Discharge status: Home / Self Care | Payer: Medicare HMO | Attending: Internal Medicine | Admitting: Internal Medicine

## 2018-06-03 ENCOUNTER — Encounter (HOSPITAL_COMMUNITY): Payer: Self-pay | Admitting: Emergency Medicine

## 2018-06-03 ENCOUNTER — Other Ambulatory Visit: Payer: Self-pay

## 2018-06-03 ENCOUNTER — Emergency Department (HOSPITAL_COMMUNITY): Payer: Medicare HMO

## 2018-06-03 DIAGNOSIS — R404 Transient alteration of awareness: Secondary | ICD-10-CM | POA: Diagnosis not present

## 2018-06-03 DIAGNOSIS — R600 Localized edema: Secondary | ICD-10-CM | POA: Diagnosis not present

## 2018-06-03 DIAGNOSIS — L0390 Cellulitis, unspecified: Secondary | ICD-10-CM | POA: Diagnosis not present

## 2018-06-03 DIAGNOSIS — R609 Edema, unspecified: Secondary | ICD-10-CM

## 2018-06-03 DIAGNOSIS — Z7982 Long term (current) use of aspirin: Secondary | ICD-10-CM | POA: Insufficient documentation

## 2018-06-03 DIAGNOSIS — Z853 Personal history of malignant neoplasm of breast: Secondary | ICD-10-CM | POA: Diagnosis not present

## 2018-06-03 DIAGNOSIS — I10 Essential (primary) hypertension: Secondary | ICD-10-CM | POA: Diagnosis not present

## 2018-06-03 DIAGNOSIS — Z79899 Other long term (current) drug therapy: Secondary | ICD-10-CM | POA: Diagnosis not present

## 2018-06-03 DIAGNOSIS — Z515 Encounter for palliative care: Secondary | ICD-10-CM

## 2018-06-03 DIAGNOSIS — Z87891 Personal history of nicotine dependence: Secondary | ICD-10-CM | POA: Insufficient documentation

## 2018-06-03 DIAGNOSIS — R4182 Altered mental status, unspecified: Secondary | ICD-10-CM | POA: Diagnosis not present

## 2018-06-03 DIAGNOSIS — K449 Diaphragmatic hernia without obstruction or gangrene: Secondary | ICD-10-CM | POA: Diagnosis not present

## 2018-06-03 DIAGNOSIS — N838 Other noninflammatory disorders of ovary, fallopian tube and broad ligament: Secondary | ICD-10-CM

## 2018-06-03 DIAGNOSIS — N839 Noninflammatory disorder of ovary, fallopian tube and broad ligament, unspecified: Secondary | ICD-10-CM | POA: Diagnosis not present

## 2018-06-03 DIAGNOSIS — G301 Alzheimer's disease with late onset: Secondary | ICD-10-CM | POA: Diagnosis not present

## 2018-06-03 DIAGNOSIS — R109 Unspecified abdominal pain: Secondary | ICD-10-CM | POA: Diagnosis not present

## 2018-06-03 DIAGNOSIS — I110 Hypertensive heart disease with heart failure: Secondary | ICD-10-CM | POA: Diagnosis not present

## 2018-06-03 DIAGNOSIS — Z7189 Other specified counseling: Secondary | ICD-10-CM

## 2018-06-03 DIAGNOSIS — I5032 Chronic diastolic (congestive) heart failure: Secondary | ICD-10-CM | POA: Insufficient documentation

## 2018-06-03 DIAGNOSIS — Z66 Do not resuscitate: Secondary | ICD-10-CM | POA: Diagnosis not present

## 2018-06-03 DIAGNOSIS — F0390 Unspecified dementia without behavioral disturbance: Secondary | ICD-10-CM | POA: Diagnosis present

## 2018-06-03 DIAGNOSIS — I890 Lymphedema, not elsewhere classified: Secondary | ICD-10-CM | POA: Diagnosis not present

## 2018-06-03 DIAGNOSIS — I471 Supraventricular tachycardia: Secondary | ICD-10-CM | POA: Diagnosis present

## 2018-06-03 NOTE — ED Provider Notes (Addendum)
[facility] EMERGENCY DEPARTMENT Provider Note   CSN: [id number] Arrival date & time: 06/03/18  2035    History   Chief Complaint Chief Complaint  Patient presents with  . Leg Swelling    HPI Alexis Winters is a 84 y.o. female.    Level 5 caveat due to dementia. HPI Patient with swelling in her legs.  Reportedly sent in by her doctor for admission for IV diuresis.  On Lasix at baseline.  EMS reportedly was not able to get much history from the staff.  Patient's daughter is now here and states that legs have been more swollen over the last few days.  However does have some chronic leg swelling.  Patient is reported been complaining of abdominal pain although daughter does not know about the mass that is present in her abdomen at this time. Past Medical History:  Diagnosis Date  . Allergic rhinitis   . Arrhythmia    a. office to call pt on 09/15/13 for 30 day event monitor  . Atrial fibrillation ([place])    a. h/o PAF, in NSR during admission 09/11/13  . Breast cancer ([place])   . Chronic diastolic CHF (congestive heart failure) ([place])    a. echo 09/12/13 EF 17-40%, grade 1 diastolic dysf, mild LVOT obstr, moderate MR, elevated filling pressure  . Dementia ([place])   . Dyslipidemia   . Enlarged liver   . Frequent UTI   . GERD (gastroesophageal reflux disease)   . Hard of hearing   . Hemorrhoids   . Hypertension   . Hypoglycemia   . Mitral regurgitation    a. moderate MR by echo 09/12/2013  . OA (osteoarthritis)    hip and back  . Orthostatic hypotension   . Phlebitis    l leg  . Prediabetes     Patient Active Problem List   Diagnosis Date Noted  . TIA (transient ischemic attack) 11/15/2016  . GERD (gastroesophageal reflux disease) 11/15/2016  . Atrial fibrillation ([place])   . SVT (supraventricular tachycardia) ([place]) 08/18/2016  . Dementia without behavioral disturbance ([place]) 08/18/2016  . UTI (urinary tract infection) 05/29/2016  . Hypokalemia 04/28/2016    . Rash and nonspecific skin eruption 04/28/2016  . Bradycardia 04/27/2016  . Atrial flutter, paroxysmal ([place]) 06/22/2015  . Near syncope 01/10/2015  . Neck pain on left side 01/10/2015  . Pulmonary nodule, right 01/10/2015  . Leukocytosis 01/10/2015  . Hypocalcemia 01/10/2015  . Dyspnea 01/10/2015  . Chest pain 09/11/2013  . Bilateral neck pain 09/11/2013  . Angina at rest [facility]) 09/11/2013  . Leg edema 09/05/2013  . Failure to thrive 09/05/2013  . Paroxysmal atrial fibrillation ([place]) 09/05/2013  . Chronic diastolic CHF (congestive heart failure) ([place]) 09/05/2013    Past Surgical History:  Procedure Laterality Date  . ABDOMINAL HYSTERECTOMY    . BLADDER SUSPENSION    . BREAST LUMPECTOMY    . CARDIAC CATHETERIZATION  1980's   [facility]  . FOOT SURGERY     left foot  . INNER EAR SURGERY       OB History   No obstetric history on file.      Home Medications    Prior to Admission medications   Medication Sig Start Date End Date Taking? Authorizing Provider  acetaminophen (TYLENOL) 500 MG tablet Take 500 mg by mouth every 4 (four) hours as needed for mild pain, fever or headache (do not exceed 2000mg ).    Yes [provider]  alum & mag hydroxide-simeth ([place])  200-200-20 MG/5ML suspension Take 30 mLs by mouth every 6 (six) hours as needed for indigestion or heartburn (max 4 doses in 24 hours).    Yes [provider]  aspirin 81 MG chewable tablet Chew 81 mg by mouth daily.    Yes [provider]  atorvastatin (LIPITOR) 40 MG tablet Take 1 tablet (40 mg total) by mouth daily at 6 PM. 11/16/16  Yes Dhungel, Nishant, MD  carvedilol (COREG) 3.125 MG tablet Take 1 tablet (3.125 mg total) by mouth 2 (two) times daily. 02/13/17 06/03/18 Yes Nahser, Wonda Cheng, MD  clotrimazole-betamethasone (LOTRISONE) cream Apply 1 application topically 2 (two) times daily as needed (avoid face, groin and underarms).   Yes [provider]  Cranberry 450 MG TABS Take 450  mg by mouth 2 (two) times daily.    Yes [provider]  feeding supplement, ENSURE ENLIVE, (ENSURE ENLIVE) LIQD Take 237 mLs by mouth 2 (two) times daily between meals.   Yes [provider]  furosemide (LASIX) 40 MG tablet Take 40 mg by mouth 2 (two) times daily.  04/09/12  Yes [provider]  guaifenesin (ROBITUSSIN) 100 MG/5ML syrup Take 200 mg by mouth every 6 (six) hours as needed for cough.   Yes [provider]  ibuprofen (ADVIL,MOTRIN) 200 MG tablet Take 200 mg by mouth every 6 (six) hours as needed (for back pain).   Yes [provider]  isosorbide mononitrate (IMDUR) 30 MG 24 hr tablet Take 1 tablet (30 mg total) by mouth daily. 10/13/15  Yes Nahser, Wonda Cheng, MD  loperamide (IMODIUM) 2 MG capsule Take 2 mg by mouth daily as needed for diarrhea or loose stools (max 8 doses in 24 hours).    Yes [provider]  magnesium hydroxide (MILK OF MAGNESIA) 400 MG/5ML suspension Take 30 mLs by mouth at bedtime as needed for mild constipation.   Yes [provider]  mirabegron ER (MYRBETRIQ) 25 MG TB24 tablet Take 25 mg by mouth daily.   Yes [provider]  Neomycin-Bacitracin-Polymyxin (HCA TRIPLE ANTIBIOTIC OINTMENT EX) Apply 1 application topically 4 (four) times daily as needed (skin tears/abrasions).    Yes [provider]  nystatin (NYSTATIN) powder Apply 1 g topically 2 (two) times daily. (apply to skin folds)   Yes [provider]  pantoprazole (PROTONIX) 40 MG tablet Take 40 mg by mouth daily. 10/19/14  Yes [provider]  polyethylene glycol (MIRALAX / GLYCOLAX) packet Take 17 g by mouth daily.   Yes [provider]  potassium chloride (K-DUR) 10 MEQ tablet Take 2 tablets (20 mEq total) by mouth 3 (three) times daily. 06/21/16  Yes Sherwood Gambler, MD  triamcinolone cream (KENALOG) 0.1 % Apply 1 application topically 2 (two) times daily.   Yes [provider]  vitamin C  (ASCORBIC ACID) 500 MG tablet Take 500 mg by mouth daily.   Yes [provider]  aspirin 325 MG tablet Take 1 tablet (325 mg total) by mouth daily. Patient not taking: Reported on 03/08/2018 11/17/16   Louellen Molder, MD    Family History Family History  Problem Relation Age of Onset  . Cerebral palsy Sister   . Alzheimer's disease Brother   . Hypertension Father   . Heart disease Brother     Social History Social History   Tobacco Use  . Smoking status: Former Research scientist (life sciences)  . Smokeless tobacco: Never Used  Substance Use Topics  . Alcohol use: No  . Drug use: No  Allergies   Contrast media [iodinated diagnostic agents]; Metoprolol; Pyridium [phenazopyridine hcl]; Levaquin [levofloxacin in d5w]; Macrodantin [nitrofurantoin macrocrystal]; Other; Tape; and Sulfa antibiotics   Review of Systems Review of Systems  Unable to perform ROS: Dementia     Physical Exam Updated Vital Signs BP 119/80   Pulse 70   Resp (!) 21   SpO2 100%   Physical Exam HENT:     Head: Atraumatic.     Mouth/Throat:     Mouth: Mucous membranes are moist.  Neck:     Musculoskeletal: Neck supple.  Cardiovascular:     Rate and Rhythm: Regular rhythm.  Pulmonary:     Breath sounds: No wheezing or rales.  Abdominal:     Palpations: There is mass.     Comments: Diffuse tenderness with mass over most of the abdomen.  Musculoskeletal:     Right lower leg: Edema present.     Left lower leg: Edema present.     Comments: Pitting edema with chronic changes and stasis on bilateral lower extremities.  Skin:    Capillary Refill: Capillary refill takes less than 2 seconds.  Neurological:     Mental Status: She is alert. Mental status is at baseline.     Comments: Patient with baseline dementia.      ED Treatments / Results  Labs (all labs ordered are listed, but only abnormal results are displayed) Labs Reviewed  COMPREHENSIVE METABOLIC PANEL  TROPONIN I  BRAIN NATRIURETIC PEPTIDE   CBC WITH DIFFERENTIAL/PLATELET    EKG EKG Interpretation  Date/Time:  Monday June 03 2018 20:50:33 EDT Ventricular Rate:  69 PR Interval:    QRS Duration: 94 QT Interval:  432 QTC Calculation: 463 R Axis:   6 Text Interpretation:  Sinus rhythm Abnormal R-wave progression, early transition Inferior infarct, old Confirmed by Davonna Belling [phone]) on 06/03/2018 9:23:38 PM   Radiology Ct Abdomen Pelvis Wo Contrast  Result Date: 06/03/2018 CLINICAL DATA:  Upper abdominal pain EXAM: CT ABDOMEN AND PELVIS WITHOUT CONTRAST TECHNIQUE: Multidetector CT imaging of the abdomen and pelvis was performed following the standard protocol without IV contrast. COMPARISON:  CT 09/27/2015. FINDINGS: Lower chest: Stable 6 mm right lower lobe pulmonary nodule, felt benign No acute consolidation or pleural effusion. Normal heart size. Coronary vascular calcification. Large hiatal hernia. Hepatobiliary: Nodular contour of the liver, suspect for cirrhosis. Previously noted hypodensity within the right hepatic lobe is not clearly visible on today's study. No calcified gallstones. No biliary dilatation. Pancreas: Unremarkable. No pancreatic ductal dilatation or surrounding inflammatory changes. Spleen: Normal in size without focal abnormality. Adrenals/Urinary Tract: Adrenal glands are within normal limits. No hydronephrosis. Cysts within the bilateral kidneys. The bladder is unremarkable Stomach/Bowel: Stomach is nonenlarged. No dilated small bowel. No colon wall thickening Vascular/Lymphatic: Extensive aortic atherosclerosis. No aneurysm. No significantly enlarged lymph nodes. Reproductive: Status post hysterectomy. Large mostly cystic mass, though with internal septations and suspected solid tissue on the right side of the mass. This appears to arise from left ovary given peripheral calcification. The mass measures approximately 17.2 cm transverse by 12 cm AP by 21 cm craniocaudad. Other: Small free fluid in the  pelvis and adjacent to the liver. No free air. Extensive subcutaneous edema. Musculoskeletal: Degenerative changes. No acute or suspicious abnormality IMPRESSION: 1. Large complex cystic and solid mass arising from the pelvis and extending into the supraumbilical abdomen. The mass measures up to 21 cm in craniocaudad dimension and is suspected to arise from the left ovary. Ovarian neoplasm is suspected. Surgical  consultation is recommended. Adnexal torsion is not confidently evaluated on this non contrasted exam. 2. Small amount of ascites within the pelvis and adjacent to the liver 3. Cirrhotic morphology of the liver 4. Extensive subcutaneous edema consistent with anasarca 5. Large hiatal hernia Electronically Signed   By: Donavan Foil M.D.   On: 06/03/2018 22:41   Dg Chest 2 View  Result Date: 06/03/2018 CLINICAL DATA:  Swelling left leg swelling EXAM: CHEST - 2 VIEW COMPARISON:  03/08/2018 FINDINGS: No acute consolidation or effusion. Heart size upper normal. Aortic atherosclerosis. No pleural effusion. Moderate hiatal hernia. IMPRESSION: No active cardiopulmonary disease. Borderline to mild cardiomegaly. Moderate hiatal hernia Electronically Signed   By: Donavan Foil M.D.   On: 06/03/2018 22:25    Procedures Procedures (including critical care time)  Medications Ordered in ED Medications - No data to display   Initial Impression / Assessment and Plan / ED Course  I have reviewed the triage vital signs and the nursing notes.  Pertinent labs & imaging results that were available during my care of the patient were reviewed by me and considered in my medical decision making (see chart for details).        Patient with peripheral edema.  Reportedly more severe and per PCP would require IV diuresis.  Has been having worsening swelling on her oral Lasix.  However patient does have palpable large mass in her abdomen.  Patient's daughter was not aware of this.  Appears to be likely ovarian in  origin.  Patient's daughter states that the patient has been told previously she is too frail for surgery.  I think patient likely clear admission to the hospital for potential diuresis but also work-up for goals of care and plan of the likely ovarian tumor.  Final Clinical Impressions(s) / ED Diagnoses   Final diagnoses:  Peripheral edema  Ovarian mass    ED Discharge Orders    None       Davonna Belling, MD 06/03/18 2352    Davonna Belling, MD 06/04/18 [phone]

## 2018-06-03 NOTE — ED Triage Notes (Signed)
Pt to ED via [facility] from [facility].  Pt sent to ED with c/o bil leg swelling.  EMS reports staff st's swelling just started in past 2 days.  Bil lower legs swollen with blisters intact  No resp distress present.  Pt has no compaints

## 2018-06-04 ENCOUNTER — Encounter (HOSPITAL_COMMUNITY): Payer: Self-pay | Admitting: Internal Medicine

## 2018-06-04 DIAGNOSIS — F0150 Vascular dementia without behavioral disturbance: Secondary | ICD-10-CM

## 2018-06-04 DIAGNOSIS — N838 Other noninflammatory disorders of ovary, fallopian tube and broad ligament: Secondary | ICD-10-CM

## 2018-06-04 DIAGNOSIS — I5032 Chronic diastolic (congestive) heart failure: Secondary | ICD-10-CM | POA: Diagnosis not present

## 2018-06-04 DIAGNOSIS — R1084 Generalized abdominal pain: Secondary | ICD-10-CM

## 2018-06-04 DIAGNOSIS — R600 Localized edema: Secondary | ICD-10-CM | POA: Diagnosis not present

## 2018-06-04 DIAGNOSIS — Z7189 Other specified counseling: Secondary | ICD-10-CM | POA: Diagnosis not present

## 2018-06-04 DIAGNOSIS — Z515 Encounter for palliative care: Secondary | ICD-10-CM

## 2018-06-04 DIAGNOSIS — R109 Unspecified abdominal pain: Secondary | ICD-10-CM | POA: Diagnosis present

## 2018-06-04 LAB — CBC WITH DIFFERENTIAL/PLATELET
Abs Immature Granulocytes: 0.02 10*3/uL (ref 0.00–0.07)
Basophils Absolute: 0 10*3/uL (ref 0.0–0.1)
Basophils Relative: 1 %
EOS ABS: 0.4 10*3/uL (ref 0.0–0.5)
EOS PCT: 5 %
HEMATOCRIT: 36.8 % (ref 36.0–46.0)
Hemoglobin: 11.6 g/dL — ABNORMAL LOW (ref 12.0–15.0)
Immature Granulocytes: 0 %
LYMPHS ABS: 1.1 10*3/uL (ref 0.7–4.0)
Lymphocytes Relative: 15 %
MCH: 28.4 pg (ref 26.0–34.0)
MCHC: 31.5 g/dL (ref 30.0–36.0)
MCV: 90 fL (ref 80.0–100.0)
MONO ABS: 0.7 10*3/uL (ref 0.1–1.0)
MONOS PCT: 10 %
Neutro Abs: 5.2 10*3/uL (ref 1.7–7.7)
Neutrophils Relative %: 69 %
PLATELETS: 222 10*3/uL (ref 150–400)
RBC: 4.09 MIL/uL (ref 3.87–5.11)
RDW: 14.1 % (ref 11.5–15.5)
WBC: 7.4 10*3/uL (ref 4.0–10.5)
nRBC: 0 % (ref 0.0–0.2)

## 2018-06-04 LAB — COMPREHENSIVE METABOLIC PANEL
ALT: 15 U/L (ref 0–44)
AST: 26 U/L (ref 15–41)
Albumin: 2.9 g/dL — ABNORMAL LOW (ref 3.5–5.0)
Alkaline Phosphatase: 79 U/L (ref 38–126)
Anion gap: 7 (ref 5–15)
BILIRUBIN TOTAL: 1 mg/dL (ref 0.3–1.2)
BUN: 16 mg/dL (ref 8–23)
CHLORIDE: 106 mmol/L (ref 98–111)
CO2: 28 mmol/L (ref 22–32)
Calcium: 8.7 mg/dL — ABNORMAL LOW (ref 8.9–10.3)
Creatinine, Ser: 0.83 mg/dL (ref 0.44–1.00)
Glucose, Bld: 98 mg/dL (ref 70–99)
Potassium: 3.7 mmol/L (ref 3.5–5.1)
Sodium: 141 mmol/L (ref 135–145)
TOTAL PROTEIN: 6.1 g/dL — AB (ref 6.5–8.1)

## 2018-06-04 LAB — MRSA PCR SCREENING: MRSA by PCR: NEGATIVE

## 2018-06-04 LAB — BRAIN NATRIURETIC PEPTIDE: B NATRIURETIC PEPTIDE 5: 217 pg/mL — AB (ref 0.0–100.0)

## 2018-06-04 LAB — TROPONIN I

## 2018-06-04 MED ORDER — MIRABEGRON ER 25 MG PO TB24
25.0000 mg | ORAL_TABLET | Freq: Every day | ORAL | Status: DC
  Administered 2018-06-04: 25 mg via ORAL
  Filled 2018-06-04: qty 1

## 2018-06-04 MED ORDER — LORAZEPAM 2 MG/ML PO CONC: 0.5000 mg | ORAL | Status: DC | PRN

## 2018-06-04 MED ORDER — MORPHINE SULFATE (CONCENTRATE) 10 MG/0.5ML PO SOLN
5.0000 mg | ORAL | Status: DC | PRN
  Administered 2018-06-05: 5 mg via SUBLINGUAL
  Filled 2018-06-04 (×2): qty 0.5

## 2018-06-04 MED ORDER — LORAZEPAM 0.5 MG PO TABS: 0.5000 mg | ORAL_TABLET | ORAL | Status: DC | PRN

## 2018-06-04 MED ORDER — POLYETHYLENE GLYCOL 3350 17 G PO PACK: 17.0000 g | PACK | Freq: Every day | ORAL | Status: DC

## 2018-06-04 MED ORDER — ACETAMINOPHEN 650 MG RE SUPP: 650.0000 mg | Freq: Four times a day (QID) | RECTAL | Status: DC | PRN

## 2018-06-04 MED ORDER — LORAZEPAM 0.5 MG PO TABS: 0.5000 mg | ORAL_TABLET | ORAL | 0 refills | Status: AC | PRN

## 2018-06-04 MED ORDER — ATORVASTATIN CALCIUM 40 MG PO TABS: 40.0000 mg | ORAL_TABLET | Freq: Every day | ORAL | Status: DC

## 2018-06-04 MED ORDER — LORAZEPAM 2 MG/ML IJ SOLN: 0.5000 mg | INTRAMUSCULAR | Status: DC | PRN

## 2018-06-04 MED ORDER — MORPHINE SULFATE (CONCENTRATE) 10 MG/0.5ML PO SOLN
5.0000 mg | ORAL | Status: DC | PRN
  Administered 2018-06-05 (×3): 5 mg via ORAL
  Filled 2018-06-04 (×2): qty 0.5

## 2018-06-04 MED ORDER — MORPHINE SULFATE (CONCENTRATE) 10 MG/0.5ML PO SOLN: 5.0000 mg | ORAL | Status: AC | PRN

## 2018-06-04 MED ORDER — PANTOPRAZOLE SODIUM 40 MG PO TBEC
40.0000 mg | DELAYED_RELEASE_TABLET | Freq: Every day | ORAL | Status: DC
  Administered 2018-06-04: 40 mg via ORAL
  Filled 2018-06-04: qty 1

## 2018-06-04 MED ORDER — ACETAMINOPHEN 325 MG PO TABS: 650.0000 mg | ORAL_TABLET | Freq: Four times a day (QID) | ORAL | Status: DC | PRN

## 2018-06-04 MED ORDER — CARVEDILOL 3.125 MG PO TABS
3.1250 mg | ORAL_TABLET | Freq: Two times a day (BID) | ORAL | Status: DC
  Administered 2018-06-04 – 2018-06-05 (×2): 3.125 mg via ORAL
  Filled 2018-06-04 (×3): qty 1

## 2018-06-04 MED ORDER — ISOSORBIDE MONONITRATE ER 30 MG PO TB24
30.0000 mg | ORAL_TABLET | Freq: Every day | ORAL | Status: DC
  Administered 2018-06-04: 30 mg via ORAL
  Filled 2018-06-04: qty 1

## 2018-06-04 MED ORDER — FUROSEMIDE 40 MG PO TABS
40.0000 mg | ORAL_TABLET | Freq: Two times a day (BID) | ORAL | Status: DC
  Administered 2018-06-04 – 2018-06-05 (×2): 40 mg via ORAL
  Filled 2018-06-04 (×3): qty 1

## 2018-06-04 MED ORDER — ENSURE ENLIVE PO LIQD: 237.0000 mL | Freq: Two times a day (BID) | ORAL | Status: DC

## 2018-06-04 MED ORDER — ONDANSETRON HCL 4 MG/2ML IJ SOLN: 4.0000 mg | Freq: Four times a day (QID) | INTRAMUSCULAR | Status: DC | PRN

## 2018-06-04 MED ORDER — ACETAMINOPHEN 325 MG PO TABS
325.0000 mg | ORAL_TABLET | Freq: Once | ORAL | Status: AC
  Administered 2018-06-04: 325 mg via ORAL
  Filled 2018-06-04 (×2): qty 1

## 2018-06-04 MED ORDER — MORPHINE SULFATE (PF) 2 MG/ML IV SOLN
0.5000 mg | INTRAVENOUS | Status: DC | PRN
  Administered 2018-06-04 (×4): 0.5 mg via INTRAVENOUS
  Filled 2018-06-04 (×4): qty 1

## 2018-06-04 MED ORDER — ONDANSETRON HCL 4 MG PO TABS: 4.0000 mg | ORAL_TABLET | Freq: Four times a day (QID) | ORAL | Status: DC | PRN

## 2018-06-04 NOTE — H&P (Signed)
History and Physical    Alexis Winters DOB: 1927/10/12 DOA: 06/03/2018  PCP: Wenda Low, MD  Patient coming from: Assisted living.  Dementia unit.  History obtained from patient's daughter.  Patient is demented.  Chief Complaint: Lower extremity edema.  HPI: Alexis Winters is a 85 y.o. female with history of dementia, SVT, CHF, hyperlipidemia was brought to the ER after patient was found to have increasing lower extremity edema over the last few days.  Has not had any shortness of breath or productive cough or chest pain.  Patient's daughter was concerned about the swelling and requested hospital transfer after discussing with PCP.  Patient takes Lasix daily.  ED Course: In the ER patient started complaining of increasing abdominal pain.  CT of the abdomen and pelvis done shows large abdominal mass likely from left ovarian measuring up to 21 cm.  CAT scan also showed some ascites and possible cirrhotic findings.  Chest x-ray was unremarkable BNP was 217.  Troponin was negative.  At this time after discussion with patient's daughter decision was made to make patient comfortable and not pursue surgery given the comorbidities.  Review of Systems: As per HPI, rest all negative.   Past Medical History:  Diagnosis Date  . Allergic rhinitis   . Arrhythmia    a. office to call pt on 09/15/13 for 30 day event monitor  . Atrial fibrillation ([place])    a. h/o PAF, in NSR during admission 09/11/13  . Breast cancer ([place])   . Chronic diastolic CHF (congestive heart failure) ([place])    a. echo 09/12/13 EF 85-63%, grade 1 diastolic dysf, mild LVOT obstr, moderate MR, elevated filling pressure  . Dementia ([place])   . Dyslipidemia   . Enlarged liver   . Frequent UTI   . GERD (gastroesophageal reflux disease)   . Hard of hearing   . Hemorrhoids   . Hypertension   . Hypoglycemia   . Mitral regurgitation    a. moderate MR by echo 09/12/2013  . OA (osteoarthritis)    hip and back  .  Orthostatic hypotension   . Phlebitis    l leg  . Prediabetes     Past Surgical History:  Procedure Laterality Date  . ABDOMINAL HYSTERECTOMY    . BLADDER SUSPENSION    . BREAST LUMPECTOMY    . CARDIAC CATHETERIZATION  1980's   [facility]  . FOOT SURGERY     left foot  . INNER EAR SURGERY       reports that she has quit smoking. She has never used smokeless tobacco. She reports that she does not drink alcohol or use drugs.  Allergies  Allergen Reactions  . Contrast Media [Iodinated Diagnostic Agents] Other (See Comments)    Cardiac arrest  . Metoprolol Other (See Comments)    Per the patient's daughter, this causes the patient's heart to race and resulted in syncope (NAME BRAND TOPROL XL ONLY!!)  . Pyridium [Phenazopyridine Hcl] Anaphylaxis, Shortness Of Breath and Swelling  . Levaquin [Levofloxacin In D5w] Other (See Comments)    Per MAR  . Macrodantin [Nitrofurantoin Macrocrystal] Other (See Comments)    Per MAR  . Other Other (See Comments)    PATIENT CANNOT TOLERATE ANY STICKS OR B/P CUFFS ON HER LEFT ARM BECAUSE OF A FORMER SURGERY (16 lymph nodes were removed due to cancer)  . Tape Other (See Comments)    PATIENT'S SKIN IS VERY THIN AND WILL "SPLIT"; PLEASE USE COBAN WRAP  . Sulfa  Antibiotics Other (See Comments)    DEVELOPED INSTANT BLISTERS ON SKIN AND WITHIN THE MOUTH THAT LASTED FOR WEEKS (WITNESSED BY A DOCTOR)    Family History  Problem Relation Age of Onset  . Cerebral palsy Sister   . Alzheimer's disease Brother   . Hypertension Father   . Heart disease Brother     Prior to Admission medications   Medication Sig Start Date End Date Taking? Authorizing Provider  acetaminophen (TYLENOL) 500 MG tablet Take 500 mg by mouth every 4 (four) hours as needed for mild pain, fever or headache (do not exceed 2000mg ).    Yes [provider]  alum & mag hydroxide-simeth (MINTOX) 027-741-28 MG/5ML suspension Take 30 mLs by mouth every 6 (six) hours as needed for  indigestion or heartburn (max 4 doses in 24 hours).    Yes [provider]  aspirin 81 MG chewable tablet Chew 81 mg by mouth daily.    Yes [provider]  atorvastatin (LIPITOR) 40 MG tablet Take 1 tablet (40 mg total) by mouth daily at 6 PM. 11/16/16  Yes Dhungel, Nishant, MD  carvedilol (COREG) 3.125 MG tablet Take 1 tablet (3.125 mg total) by mouth 2 (two) times daily. 02/13/17 06/03/18 Yes Nahser, Wonda Cheng, MD  clotrimazole-betamethasone (LOTRISONE) cream Apply 1 application topically 2 (two) times daily as needed (avoid face, groin and underarms).   Yes [provider]  Cranberry 450 MG TABS Take 450 mg by mouth 2 (two) times daily.    Yes [provider]  feeding supplement, ENSURE ENLIVE, (ENSURE ENLIVE) LIQD Take 237 mLs by mouth 2 (two) times daily between meals.   Yes [provider]  furosemide (LASIX) 40 MG tablet Take 40 mg by mouth 2 (two) times daily.  04/09/12  Yes [provider]  guaifenesin (ROBITUSSIN) 100 MG/5ML syrup Take 200 mg by mouth every 6 (six) hours as needed for cough.   Yes [provider]  ibuprofen (ADVIL,MOTRIN) 200 MG tablet Take 200 mg by mouth every 6 (six) hours as needed (for back pain).   Yes [provider]  isosorbide mononitrate (IMDUR) 30 MG 24 hr tablet Take 1 tablet (30 mg total) by mouth daily. 10/13/15  Yes Nahser, Wonda Cheng, MD  loperamide (IMODIUM) 2 MG capsule Take 2 mg by mouth daily as needed for diarrhea or loose stools (max 8 doses in 24 hours).    Yes [provider]  magnesium hydroxide (MILK OF MAGNESIA) 400 MG/5ML suspension Take 30 mLs by mouth at bedtime as needed for mild constipation.   Yes [provider]  mirabegron ER (MYRBETRIQ) 25 MG TB24 tablet Take 25 mg by mouth daily.   Yes [provider]  Neomycin-Bacitracin-Polymyxin (HCA TRIPLE ANTIBIOTIC OINTMENT EX) Apply 1 application topically 4 (four) times daily as needed (skin  tears/abrasions).    Yes [provider]  nystatin (NYSTATIN) powder Apply 1 g topically 2 (two) times daily. (apply to skin folds)   Yes [provider]  pantoprazole (PROTONIX) 40 MG tablet Take 40 mg by mouth daily. 10/19/14  Yes [provider]  polyethylene glycol (MIRALAX / GLYCOLAX) packet Take 17 g by mouth daily.   Yes [provider]  potassium chloride (K-DUR) 10 MEQ tablet Take 2 tablets (20 mEq total) by mouth 3 (three) times daily. 06/21/16  Yes Sherwood Gambler, MD  triamcinolone cream (KENALOG) 0.1 % Apply 1 application topically 2 (two) times daily.   Yes [provider]  vitamin C (ASCORBIC ACID)  500 MG tablet Take 500 mg by mouth daily.   Yes [provider]  aspirin 325 MG tablet Take 1 tablet (325 mg total) by mouth daily. Patient not taking: Reported on 03/08/2018 11/17/16   Louellen Molder, MD    Physical Exam: Vitals:   06/04/18 0115 06/04/18 0223 06/04/18 0501 06/04/18 0518  BP: (!) 126/92 (!) 106/54 (!) 128/59   Pulse: 80 68    Resp: 18 18 20    Temp:   97.7 F (36.5 C)   TempSrc:   Oral   SpO2: 97% 98%    Weight:    63.6 kg      Constitutional: Moderately built and nourished. Vitals:   06/04/18 0115 06/04/18 0223 06/04/18 0501 06/04/18 0518  BP: (!) 126/92 (!) 106/54 (!) 128/59   Pulse: 80 68    Resp: 18 18 20    Temp:   97.7 F (36.5 C)   TempSrc:   Oral   SpO2: 97% 98%    Weight:    63.6 kg   Eyes: Anicteric no pallor. ENMT: No discharge from the ears eyes nose and mouth. Neck: No mass felt.  No neck rigidity. Respiratory: No rhonchi or crepitations. Cardiovascular: S1-S2 heard. Abdomen: Distended nontender no rigidity or guarding no rebound tenderness. Musculoskeletal: Bilateral lower extremity edema present. Skin: Chronic skin changes in the lower extremity. Neurologic: Alert awake oriented to her name.  Follows commands moves extremities. Psychiatric: Oriented to her name.   Labs on  Admission: I have personally reviewed following labs and imaging studies  CBC: Recent Labs  Lab 06/03/18 2348  WBC 7.4  NEUTROABS 5.2  HGB 11.6*  HCT 36.8  MCV 90.0  PLT 662   Basic Metabolic Panel: Recent Labs  Lab 06/03/18 2348  NA 141  K 3.7  CL 106  CO2 28  GLUCOSE 98  BUN 16  CREATININE 0.83  CALCIUM 8.7*   GFR: Estimated Creatinine Clearance: 40.3 mL/min (by C-G formula based on SCr of 0.83 mg/dL). Liver Function Tests: Recent Labs  Lab 06/03/18 2348  AST 26  ALT 15  ALKPHOS 79  BILITOT 1.0  PROT 6.1*  ALBUMIN 2.9*   No results for input(s): LIPASE, AMYLASE in the last 168 hours. No results for input(s): AMMONIA in the last 168 hours. Coagulation Profile: No results for input(s): INR, PROTIME in the last 168 hours. Cardiac Enzymes: Recent Labs  Lab 06/03/18 2348  TROPONINI <0.03   BNP (last 3 results) No results for input(s): PROBNP in the last 8760 hours. HbA1C: No results for input(s): HGBA1C in the last 72 hours. CBG: No results for input(s): GLUCAP in the last 168 hours. Lipid Profile: No results for input(s): CHOL, HDL, LDLCALC, TRIG, CHOLHDL, LDLDIRECT in the last 72 hours. Thyroid Function Tests: No results for input(s): TSH, T4TOTAL, FREET4, T3FREE, THYROIDAB in the last 72 hours. Anemia Panel: No results for input(s): VITAMINB12, FOLATE, FERRITIN, TIBC, IRON, RETICCTPCT in the last 72 hours. Urine analysis:    Component Value Date/Time   COLORURINE STRAW (A) 03/08/2018 1324   APPEARANCEUR CLEAR (A) 03/08/2018 1324   LABSPEC 1.020 03/27/2018 1944   PHURINE 5.5 03/27/2018 1944   GLUCOSEU NEGATIVE 03/27/2018 1944   HGBUR NEGATIVE 03/27/2018 1944   BILIRUBINUR NEGATIVE 03/27/2018 1944   KETONESUR NEGATIVE 03/27/2018 1944   PROTEINUR NEGATIVE 03/27/2018 1944   UROBILINOGEN 0.2 03/27/2018 1944   NITRITE NEGATIVE 03/27/2018 1944   LEUKOCYTESUR NEGATIVE 03/27/2018 1944   Sepsis Labs: @LABRCNTIP (procalcitonin:4,lacticidven:4) )No  results found for this or any  previous visit (from the past 240 hour(s)).   Radiological Exams on Admission: Ct Abdomen Pelvis Wo Contrast  Result Date: 06/03/2018 CLINICAL DATA:  Upper abdominal pain EXAM: CT ABDOMEN AND PELVIS WITHOUT CONTRAST TECHNIQUE: Multidetector CT imaging of the abdomen and pelvis was performed following the standard protocol without IV contrast. COMPARISON:  CT 09/27/2015. FINDINGS: Lower chest: Stable 6 mm right lower lobe pulmonary nodule, felt benign No acute consolidation or pleural effusion. Normal heart size. Coronary vascular calcification. Large hiatal hernia. Hepatobiliary: Nodular contour of the liver, suspect for cirrhosis. Previously noted hypodensity within the right hepatic lobe is not clearly visible on today's study. No calcified gallstones. No biliary dilatation. Pancreas: Unremarkable. No pancreatic ductal dilatation or surrounding inflammatory changes. Spleen: Normal in size without focal abnormality. Adrenals/Urinary Tract: Adrenal glands are within normal limits. No hydronephrosis. Cysts within the bilateral kidneys. The bladder is unremarkable Stomach/Bowel: Stomach is nonenlarged. No dilated small bowel. No colon wall thickening Vascular/Lymphatic: Extensive aortic atherosclerosis. No aneurysm. No significantly enlarged lymph nodes. Reproductive: Status post hysterectomy. Large mostly cystic mass, though with internal septations and suspected solid tissue on the right side of the mass. This appears to arise from left ovary given peripheral calcification. The mass measures approximately 17.2 cm transverse by 12 cm AP by 21 cm craniocaudad. Other: Small free fluid in the pelvis and adjacent to the liver. No free air. Extensive subcutaneous edema. Musculoskeletal: Degenerative changes. No acute or suspicious abnormality IMPRESSION: 1. Large complex cystic and solid mass arising from the pelvis and extending into the supraumbilical abdomen. The mass measures up to 21  cm in craniocaudad dimension and is suspected to arise from the left ovary. Ovarian neoplasm is suspected. Surgical consultation is recommended. Adnexal torsion is not confidently evaluated on this non contrasted exam. 2. Small amount of ascites within the pelvis and adjacent to the liver 3. Cirrhotic morphology of the liver 4. Extensive subcutaneous edema consistent with anasarca 5. Large hiatal hernia Electronically Signed   By: Donavan Foil M.D.   On: 06/03/2018 22:41   Dg Chest 2 View  Result Date: 06/03/2018 CLINICAL DATA:  Swelling left leg swelling EXAM: CHEST - 2 VIEW COMPARISON:  03/08/2018 FINDINGS: No acute consolidation or effusion. Heart size upper normal. Aortic atherosclerosis. No pleural effusion. Moderate hiatal hernia. IMPRESSION: No active cardiopulmonary disease. Borderline to mild cardiomegaly. Moderate hiatal hernia Electronically Signed   By: Donavan Foil M.D.   On: 06/03/2018 22:25    EKG: Independently reviewed.  Normal sinus rhythm.  Assessment/Plan Principal Problem:   Abdominal pain Active Problems:   Chronic diastolic CHF (congestive heart failure) (H[facility])   SVT (supraventricular tachycardia) ([facility])   Dementia without behavioral disturbance ([facility])   Lower extremity edema   Ovarian mass    1. Abdominal pain with large abdominal mass likely ovarian in origin. 2. Bilateral lower extremity edema with abdominal mass probably contributing to it.  Also has a history of CHF. 3. History of SVT on beta-blockers. 4. Dementia. 5. Normocytic normochromic anemia. 6. Chronic diastolic CHF.  Plan -after discussing with patient's daughter who is at the bedside was decided to make patient comfort measures only and has been placed on morphine.  Continue home medications.  We will consult palliative care for further recommendations.  Patient is a DNR.   DVT prophylaxis: SCDs. Code Status: DNR. Family Communication: Patient's daughter. Disposition Plan: To be  determined. Consults called: Palliative care. Admission status: Observation.   Rise Patience MD [facility] Pager [phone].  If  7PM-7AM, please contact night-coverage [email or web address] Password [email or web address]  06/04/2018, 6:10 AM

## 2018-06-04 NOTE — Consult Note (Signed)
Consultation Note Date: 06/04/2018   Patient Name: Alexis Winters  DOB: 30-Jan-1928  MRN: [id number]  Age / Sex: 85 y.o., female  PCP: Wenda Low, MD Referring Physician: Hosie Poisson, MD  Reason for Consultation: Hospice Evaluation, Inpatient hospice referral, Non pain symptom management, Pain control, Psychosocial/spiritual support and Terminal Care   HPI/Patient Profile: 85 y.o. female  with past medical history of dementia, CHF, HLD, a fib, and HTN admitted on 06/03/2018 with increased lower extremity. Found to have a large abdominal mass - likely ovarian cancer. Patient comes to hospital from [place]. PMT consulted for [place].   Clinical Assessment and Goals of Care: I have reviewed medical records including EPIC notes, labs and imaging, received report from Dr. Karleen Hampshire, assessed the patient and then met with patient's daughter, Andrey Campanile,  to discuss diagnosis prognosis, GOC, EOL wishes, disposition and options.  I introduced Palliative Medicine as specialized medical care for people living with serious illness. It focuses on providing relief from the symptoms and stress of a serious illness. The goal is to improve quality of life for both the patient and the family.  We discussed a brief life review of the patient. She tells me about the patient's children. One of her daughters passed away in May 24, 2011 - she had cerebral palsy. She has a son who has advanced alzheimer's. She tells me of patient's faith.  As far as functional and nutritional status, she tells me patient was doing well and she had no concerns about her health. She shares that this news is very shocking.   We discussed her current illness and what it means in the larger context of her on-going co-morbidities.  Natural disease trajectory and expectations at EOL were discussed. She shares with me her conversations with Dr. Hal Hope and Dr. Karleen Hampshire - good understanding of illnesses.  She understands patient is nearing end of life.   I attempted to elicit values and goals of care important to the patient. Daughter tells me patient has frequently spoken about her end of life wishes and is adamant that she does not want life prolonged and does not want to suffer.   The difference between aggressive medical intervention and comfort care was considered in light of the patient's goals of care.  Daughter would like to focus on patient's comfort.   She talks about pain medication making patient drowsy; however, her goal if for her mom to not be in pain and she accepts drowsiness as long as patient is not suffering. Tells me patient is very sensitive to narcotics.   We discussed that patient is so lethargic she is not eating. Again, daughter shares her concern is that patient is not suffering. She accepts sedation/not eating. She tells me that she did eat a few bites of breakfast this morning. We discussed the expectation that appetite will continue to decrease.  Advance directives, concepts specific to code status, artifical feeding and hydration, and rehospitalization were considered and discussed. Confirmed DNR.  Hospice and Palliative Care services outpatient were explained and offered. Daughter is interested in [facility] - we discussed the type of care provided at hospice facility.   Questions and concerns were addressed. The family was encouraged to call with questions or concerns.   Primary Decision Maker HCPOA - daughter [place]   Residential hospice Comfort care  Code Status/Advance Care Planning:  DNR   Symptom Management:   PRN morphine and ativan - discussed medications with daughter - she feels  mother receives relief with current dose - discussed trying sublingual morphine and see if it provides effective relief but allows for more periods of alertness  Palliative Prophylaxis:   Aspiration, Delirium Protocol and  Frequent Pain Assessment  Additional Recommendations (Limitations, Scope, Preferences):  Full Comfort Care  Psycho-social/Spiritual:   Desire for further Chaplaincy support:no  Additional Recommendations: Education on Hospice  Prognosis:   < 2 weeks  Discharge Planning: Hospice facility      Primary Diagnoses: Present on Admission: . Lower extremity edema . Abdominal pain . SVT (supraventricular tachycardia) ([place]) . Dementia without behavioral disturbance ([place]) . Chronic diastolic CHF (congestive heart failure) ([place])   I have reviewed the medical record, interviewed the patient and family, and examined the patient. The following aspects are pertinent.  Past Medical History:  Diagnosis Date  . Allergic rhinitis   . Arrhythmia    a. office to call pt on 09/15/13 for 30 day event monitor  . Atrial fibrillation ([place])    a. h/o PAF, in NSR during admission 09/11/13  . Breast cancer ([place])   . Chronic diastolic CHF (congestive heart failure) ([place])    a. echo 09/12/13 EF 42-87%, grade 1 diastolic dysf, mild LVOT obstr, moderate MR, elevated filling pressure  . Dementia ([place])   . Dyslipidemia   . Enlarged liver   . Frequent UTI   . GERD (gastroesophageal reflux disease)   . Hard of hearing   . Hemorrhoids   . Hypertension   . Hypoglycemia   . Mitral regurgitation    a. moderate MR by echo 09/12/2013  . OA (osteoarthritis)    hip and back  . Orthostatic hypotension   . Phlebitis    l leg  . Prediabetes    Social History   Socioeconomic History  . Marital status: Widowed    Spouse name: Not on file  . Number of children: Not on file  . Years of education: Not on file  . Highest education level: Not on file  Occupational History  . Not on file  Social Needs  . Financial resource strain: Not on file  . Food insecurity:    Worry: Not on file    Inability: Not on file  . Transportation needs:    Medical: Not on file    Non-medical: Not on file  Tobacco Use    . Smoking status: Former Research scientist (life sciences)  . Smokeless tobacco: Never Used  Substance and Sexual Activity  . Alcohol use: No  . Drug use: No  . Sexual activity: Never  Lifestyle  . Physical activity:    Days per week: Not on file    Minutes per session: Not on file  . Stress: Not on file  Relationships  . Social connections:    Talks on phone: Not on file    Gets together: Not on file    Attends religious service: Not on file    Active member of club or organization: Not on file    Attends meetings of clubs or organizations: Not on file    Relationship status: Not on file  Other Topics Concern  . Not on file  Social History Narrative  . Not on file   Family History  Problem Relation Age of Onset  . Cerebral palsy Sister   . Alzheimer's disease Brother   . Hypertension Father   . Heart disease Brother    Scheduled Meds: . atorvastatin  40 mg Oral q1800  . carvedilol  3.125 mg Oral BID WC  .  feeding supplement (ENSURE ENLIVE)  237 mL Oral BID BM  . furosemide  40 mg Oral BID  . isosorbide mononitrate  30 mg Oral Daily  . mirabegron ER  25 mg Oral Daily  . pantoprazole  40 mg Oral Daily  . polyethylene glycol  17 g Oral Daily   Continuous Infusions: PRN Meds:.acetaminophen **OR** acetaminophen, morphine injection, ondansetron **OR** ondansetron (ZOFRAN) IV Allergies  Allergen Reactions  . Contrast Media [Iodinated Diagnostic Agents] Other (See Comments)    Cardiac arrest  . Metoprolol Other (See Comments)    Per the patient's daughter, this causes the patient's heart to race and resulted in syncope (NAME BRAND TOPROL XL ONLY!!)  . Pyridium [Phenazopyridine Hcl] Anaphylaxis, Shortness Of Breath and Swelling  . Levaquin [Levofloxacin In D5w] Other (See Comments)    Per MAR  . Macrodantin [Nitrofurantoin Macrocrystal] Other (See Comments)    Per MAR  . Other Other (See Comments)    PATIENT CANNOT TOLERATE ANY STICKS OR B/P CUFFS ON HER LEFT ARM BECAUSE OF A FORMER SURGERY (16  lymph nodes were removed due to cancer)  . Tape Other (See Comments)    PATIENT'S SKIN IS VERY THIN AND WILL "SPLIT"; PLEASE USE COBAN WRAP  . Sulfa Antibiotics Other (See Comments)    DEVELOPED INSTANT BLISTERS ON SKIN AND WITHIN THE MOUTH THAT LASTED FOR WEEKS (WITNESSED BY A DOCTOR)   Review of Systems  Unable to perform ROS: Dementia    Physical Exam Constitutional:      General: She is sleeping. She is not in acute distress.    Appearance: She is underweight.  Cardiovascular:     Rate and Rhythm: Normal rate and regular rhythm.  Pulmonary:     Effort: Pulmonary effort is normal.  Neurological:     Mental Status: She is disoriented.  Psychiatric:        Cognition and Memory: Cognition is impaired. Memory is impaired.     Vital Signs: BP (!) 95/58 (BP Location: Right Arm)   Pulse 71   Temp 97.8 F (36.6 C) (Oral)   Resp (!) 24   Wt 63.6 kg   SpO2 96%   BMI 25.65 kg/m  Pain Scale: PAINAD   Pain Score: 0-No pain   SpO2: SpO2: 96 % O2 Device:SpO2: 96 % O2 Flow Rate: .   IO: Intake/output summary:   Intake/Output Summary (Last 24 hours) at 06/04/2018 1611 Last data filed at 06/04/2018 0900 Gross per 24 hour  Intake 240 ml  Output -  Net 240 ml    LBM:   Baseline Weight: Weight: 63.6 kg Most recent weight: Weight: 63.6 kg     Palliative Assessment/Data: PPS 20%    Time Total: 70 minutes Greater than 50%  of this time was spent counseling and coordinating care related to the above assessment and plan.  Juel Burrow, DNP, AGNP-C Palliative Medicine Team [phone] Pager: [phone]

## 2018-06-04 NOTE — Progress Notes (Signed)
Manufacturing engineer [facility]) Hospital Liaison note.   Received request from Manuela Schwartz, [place] for family interest in [facility]. Chart reviewed and spoke with family to acknowledge referral. Eligibility has been confirmed,  Patient can transfer on 06/05/2018.  Family and CSW are aware [facility] liaison will follow up with family to on 06/05/2018 to complete paperwork and will let CSW know when this has been completed.  Please do not hesitate to call with questions.   Thank you,    Farrel Gordon, RN, [facility]   [place]     [place] are on AMION

## 2018-06-04 NOTE — TOC Initial Note (Addendum)
Transition of Care [facility]) - Initial/Assessment Note    Patient Details  Name: Alexis Winters MRN: [id number] Date of Birth: Jun 18, 1927  Transition of Care [facility]) CM/SW Contact:    Estanislado Emms, LCSW Phone Number: 06/04/2018, 2:39 PM  Clinical Narrative: Patient from [place]. Plan is now for comfort care. CSW consulted for residential hospice placement. CSW met with patient's daughter, Andrey Campanile. Daughter tearful, stating she did not expect her mother's prognosis to be so short. Daughter is familiar with [place] from when her sister passed away. She would like for her mother to go to [place] as well. Daughter has good understanding of patient's condition and prognosis.  CSW made referral to Bevely Palmer at [facility]. Awaiting follow up for bed availability at [facility]. CSW to follow and support.   Update 4:04 pm: Patient will have a bed at [facility] on 06/05/18. Updated MD. Will support with discharge.                   Expected Discharge Plan: [place] Barriers to Discharge: No Barriers Identified   Patient Goals and CMS Choice   CMS Medicare.gov Compare Post Acute Care list provided to:: Other (Comment Required)(N/A ) Choice offered to / list presented to : NA  Expected Discharge Plan and Services Expected Discharge Plan: [place] Choice: Hospice Living arrangements for the past 2 months: [place]                          Prior Living Arrangements/Services Living arrangements for the past 2 months: [place] Lives with:: Facility Resident Patient language and need for interpreter reviewed:: No Do you feel safe going back to the place where you live?: Yes      Need for Family Participation in Patient Care: Yes (Comment) Care giver support system in place?: Yes (comment)   Criminal Activity/Legal Involvement Pertinent to Current Situation/Hospitalization: No - Comment  as needed  Activities of Daily Living      Permission Sought/Granted Permission sought to share information with : Family Supports, Chartered certified accountant granted to share information with : No(patient not alert, nearing end of life)  Share Information with NAME: Jacquelina Hewins  Permission granted to share info w AGENCY: [place] granted to share info w Relationship: daughter  Permission granted to share info w Contact Information: [phone]  Emotional Assessment Appearance:: Appears stated age Attitude/Demeanor/Rapport: Unable to Assess Affect (typically observed): Unable to Assess Orientation: : Oriented to Self Alcohol / Substance Use: Not Applicable Psych Involvement: No (comment)  Admission diagnosis:  Peripheral edema [R60.9] Ovarian mass [N83.8] Patient Active Problem List   Diagnosis Date Noted  . Lower extremity edema 06/04/2018  . Abdominal pain 06/04/2018  . Ovarian mass 06/04/2018  . TIA (transient ischemic attack) 11/15/2016  . GERD (gastroesophageal reflux disease) 11/15/2016  . Atrial fibrillation ([place])   . SVT (supraventricular tachycardia) ([place]) 08/18/2016  . Dementia without behavioral disturbance ([place]) 08/18/2016  . UTI (urinary tract infection) 05/29/2016  . Hypokalemia 04/28/2016  . Rash and nonspecific skin eruption 04/28/2016  . Bradycardia 04/27/2016  . Atrial flutter, paroxysmal ([place]) 06/22/2015  . Near syncope 01/10/2015  . Neck pain on left side 01/10/2015  . Pulmonary nodule, right 01/10/2015  . Leukocytosis 01/10/2015  . Hypocalcemia 01/10/2015  . Dyspnea 01/10/2015  . Chest pain 09/11/2013  . Bilateral neck pain 09/11/2013  . Angina  at rest [facility]) 09/11/2013  . Leg edema 09/05/2013  . Failure to thrive 09/05/2013  . Paroxysmal atrial fibrillation ([place]) 09/05/2013  . Chronic diastolic CHF (congestive heart failure) ([place]) 09/05/2013   PCP:  Wenda Low, MD Pharmacy:   [id number]  [place], [place] - [id number]: [phone]: [phone] [id number] [place] [id number]: [phone]: [phone]   Social Determinants of Health (SDOH) Interventions    Readmission Risk Interventions  No flowsheet data found.

## 2018-06-04 NOTE — Discharge Summary (Signed)
Physician Discharge Summary  Alexis Winters QMV:[id number] DOB: 1927/04/01 DOA: 06/03/2018  PCP: Wenda Low, MD  Admit date: 06/03/2018 Discharge date: 06/05/2018  Admitted From: [facility].  Disposition: [facility].   Recommendations for Outpatient Follow-up:  Follow up with hospice MD as needed.    Discharge Condition: hospice.  CODE STATUS:comfort care.  Diet recommendation: comfort feeds.    Brief/Interim Summary: Alexis Winters is a 85 y.o. female with history of dementia, SVT, CHF, hyperlipidemia was brought to the ER after patient was found to have increasing lower extremity edema over the last few days.  Has not had any shortness of breath or productive cough or chest pain.  Patient's daughter was concerned about the swelling and requested hospital transfer after discussing with PCP.  In the ER patient started complaining of increasing abdominal pain.  CT of the abdomen and pelvis done shows large abdominal mass likely from left ovarian measuring up to 21 cm.  CAT scan also showed some ascites and possible cirrhotic findings.   At this time after discussion with patient's daughter decision was made to make patient comfortable and not pursue surgery given the comorbidities.  Palliative care was consulted and she was referred to  [facility] . Possible d/c tomorrow when bed is available.     Discharge Diagnoses:  Principal Problem:   Abdominal pain Active Problems:   Chronic diastolic CHF (congestive heart failure) (HCC)   SVT (supraventricular tachycardia) (HCC)   Dementia without behavioral disturbance (HCC)   Lower extremity edema   Ovarian mass   Comfort measures only status   Goals of care, counseling/discussion   Palliative care by specialist    Discharge Instructions   Allergies as of 06/04/2018      Reactions   Contrast Media [iodinated Diagnostic Agents] Other (See Comments)   Cardiac arrest   Metoprolol Other (See Comments)   Per the patient's  daughter, this causes the patient's heart to race and resulted in syncope (NAME BRAND TOPROL XL ONLY!!)   Pyridium [phenazopyridine Hcl] Anaphylaxis, Shortness Of Breath, Swelling   Levaquin [levofloxacin In D5w] Other (See Comments)   Per MAR   Macrodantin [nitrofurantoin Macrocrystal] Other (See Comments)   Per MAR   Other Other (See Comments)   PATIENT CANNOT TOLERATE ANY STICKS OR B/P CUFFS ON HER LEFT ARM BECAUSE OF A FORMER SURGERY (16 lymph nodes were removed due to cancer)   Tape Other (See Comments)   PATIENT'S SKIN IS VERY THIN AND WILL "SPLIT"; PLEASE USE COBAN WRAP   Sulfa Antibiotics Other (See Comments)   DEVELOPED INSTANT BLISTERS ON SKIN AND WITHIN THE MOUTH THAT LASTED FOR WEEKS (WITNESSED BY A DOCTOR)      Medication List    STOP taking these medications   aspirin 325 MG tablet   aspirin 81 MG chewable tablet   atorvastatin 40 MG tablet Commonly known as:  LIPITOR   clotrimazole-betamethasone cream Commonly known as:  LOTRISONE   Cranberry 450 MG Tabs   guaifenesin 100 MG/5ML syrup Commonly known as:  ROBITUSSIN   HCA TRIPLE ANTIBIOTIC OINTMENT EX   ibuprofen 200 MG tablet Commonly known as:  ADVIL,MOTRIN   loperamide 2 MG capsule Commonly known as:  IMODIUM   magnesium hydroxide 400 MG/5ML suspension Commonly known as:  MILK OF MAGNESIA   Myrbetriq 25 MG Tb24 tablet Generic drug:  mirabegron ER   nystatin powder Generic drug:  nystatin   pantoprazole 40 MG tablet Commonly known as:  PROTONIX   potassium chloride  10 MEQ tablet Commonly known as:  K-DUR   triamcinolone cream 0.1 % Commonly known as:  KENALOG   vitamin C 500 MG tablet Commonly known as:  ASCORBIC ACID     TAKE these medications   acetaminophen 500 MG tablet Commonly known as:  TYLENOL Take 500 mg by mouth every 4 (four) hours as needed for mild pain, fever or headache (do not exceed 2000mg ).   carvedilol 3.125 MG tablet Commonly known as:  COREG Take 1 tablet (3.125  mg total) by mouth 2 (two) times daily.   feeding supplement (ENSURE ENLIVE) Liqd Take 237 mLs by mouth 2 (two) times daily between meals.   furosemide 40 MG tablet Commonly known as:  LASIX Take 40 mg by mouth 2 (two) times daily.   isosorbide mononitrate 30 MG 24 hr tablet Commonly known as:  IMDUR Take 1 tablet (30 mg total) by mouth daily.   LORazepam 0.5 MG tablet Commonly known as:  ATIVAN Take 1 tablet (0.5 mg total) by mouth every 4 (four) hours as needed for anxiety.   Mintox 151-761-60 MG/5ML suspension Generic drug:  alum & mag hydroxide-simeth Take 30 mLs by mouth every 6 (six) hours as needed for indigestion or heartburn (max 4 doses in 24 hours).   morphine CONCENTRATE 10 MG/0.5ML Soln concentrated solution Take 0.25 mLs (5 mg total) by mouth every 2 (two) hours as needed for moderate pain (or dyspnea).   polyethylene glycol packet Commonly known as:  MIRALAX / GLYCOLAX Take 17 g by mouth daily.       Allergies  Allergen Reactions  . Contrast Media [Iodinated Diagnostic Agents] Other (See Comments)    Cardiac arrest  . Metoprolol Other (See Comments)    Per the patient's daughter, this causes the patient's heart to race and resulted in syncope (NAME BRAND TOPROL XL ONLY!!)  . Pyridium [Phenazopyridine Hcl] Anaphylaxis, Shortness Of Breath and Swelling  . Levaquin [Levofloxacin In D5w] Other (See Comments)    Per MAR  . Macrodantin [Nitrofurantoin Macrocrystal] Other (See Comments)    Per MAR  . Other Other (See Comments)    PATIENT CANNOT TOLERATE ANY STICKS OR B/P CUFFS ON HER LEFT ARM BECAUSE OF A FORMER SURGERY (16 lymph nodes were removed due to cancer)  . Tape Other (See Comments)    PATIENT'S SKIN IS VERY THIN AND WILL "SPLIT"; PLEASE USE COBAN WRAP  . Sulfa Antibiotics Other (See Comments)    DEVELOPED INSTANT BLISTERS ON SKIN AND WITHIN THE MOUTH THAT LASTED FOR WEEKS (WITNESSED BY A DOCTOR)    Consultations: Palliative care.    Procedures/Studies: Ct Abdomen Pelvis Wo Contrast  Result Date: 06/03/2018 CLINICAL DATA:  Upper abdominal pain EXAM: CT ABDOMEN AND PELVIS WITHOUT CONTRAST TECHNIQUE: Multidetector CT imaging of the abdomen and pelvis was performed following the standard protocol without IV contrast. COMPARISON:  CT 09/27/2015. FINDINGS: Lower chest: Stable 6 mm right lower lobe pulmonary nodule, felt benign No acute consolidation or pleural effusion. Normal heart size. Coronary vascular calcification. Large hiatal hernia. Hepatobiliary: Nodular contour of the liver, suspect for cirrhosis. Previously noted hypodensity within the right hepatic lobe is not clearly visible on today's study. No calcified gallstones. No biliary dilatation. Pancreas: Unremarkable. No pancreatic ductal dilatation or surrounding inflammatory changes. Spleen: Normal in size without focal abnormality. Adrenals/Urinary Tract: Adrenal glands are within normal limits. No hydronephrosis. Cysts within the bilateral kidneys. The bladder is unremarkable Stomach/Bowel: Stomach is nonenlarged. No dilated small bowel. No colon wall thickening Vascular/Lymphatic: Extensive aortic  atherosclerosis. No aneurysm. No significantly enlarged lymph nodes. Reproductive: Status post hysterectomy. Large mostly cystic mass, though with internal septations and suspected solid tissue on the right side of the mass. This appears to arise from left ovary given peripheral calcification. The mass measures approximately 17.2 cm transverse by 12 cm AP by 21 cm craniocaudad. Other: Small free fluid in the pelvis and adjacent to the liver. No free air. Extensive subcutaneous edema. Musculoskeletal: Degenerative changes. No acute or suspicious abnormality IMPRESSION: 1. Large complex cystic and solid mass arising from the pelvis and extending into the supraumbilical abdomen. The mass measures up to 21 cm in craniocaudad dimension and is suspected to arise from the left ovary. Ovarian  neoplasm is suspected. Surgical consultation is recommended. Adnexal torsion is not confidently evaluated on this non contrasted exam. 2. Small amount of ascites within the pelvis and adjacent to the liver 3. Cirrhotic morphology of the liver 4. Extensive subcutaneous edema consistent with anasarca 5. Large hiatal hernia Electronically Signed   By: Donavan Foil M.D.   On: 06/03/2018 22:41   Dg Chest 2 View  Result Date: 06/03/2018 CLINICAL DATA:  Swelling left leg swelling EXAM: CHEST - 2 VIEW COMPARISON:  03/08/2018 FINDINGS: No acute consolidation or effusion. Heart size upper normal. Aortic atherosclerosis. No pleural effusion. Moderate hiatal hernia. IMPRESSION: No active cardiopulmonary disease. Borderline to mild cardiomegaly. Moderate hiatal hernia Electronically Signed   By: Donavan Foil M.D.   On: 06/03/2018 22:25       Subjective: Reports leg pain.  Discharge Exam: Vitals:   06/04/18 0501 06/04/18 1531  BP: (!) 128/59 (!) 95/58  Pulse:  71  Resp: 20 (!) 24  Temp: 97.7 F (36.5 C) 97.8 F (36.6 C)  SpO2:  96%   Vitals:   06/04/18 0223 06/04/18 0501 06/04/18 0518 06/04/18 1531  BP: (!) 106/54 (!) 128/59  (!) 95/58  Pulse: 68   71  Resp: 18 20  (!) 24  Temp:  97.7 F (36.5 C)  97.8 F (36.6 C)  TempSrc:  Oral  Oral  SpO2: 98%   96%  Weight:   63.6 kg     General: Pt is alert, awake, not in acute distress Cardiovascular: RRR, S1/S2 +, no rubs, no gallops Respiratory: diminished at bases.  Abdominal: Soft, mildly tender.  Extremities: 2+ leg edema.     The results of significant diagnostics from this hospitalization (including imaging, microbiology, ancillary and laboratory) are listed below for reference.     Microbiology: Recent Results (from the past 240 hour(s))  MRSA PCR Screening     Status: None   Collection Time: 06/04/18  5:10 AM  Result Value Ref Range Status   MRSA by PCR NEGATIVE NEGATIVE Final    Comment:        The GeneXpert MRSA Assay  (FDA approved for NASAL specimens only), is one component of a comprehensive MRSA colonization surveillance program. It is not intended to diagnose MRSA infection nor to guide or monitor treatment for MRSA infections. Performed at [facility], [place] [address] [place], [place] [address]      Labs: BNP (last 3 results) Recent Labs    06/03/18 2348  BNP 081.3*   Basic Metabolic Panel: Recent Labs  Lab 06/03/18 2348  NA 141  K 3.7  CL 106  CO2 28  GLUCOSE 98  BUN 16  CREATININE 0.83  CALCIUM 8.7*   Liver Function Tests: Recent Labs  Lab 06/03/18 2348  AST 26  ALT 15  ALKPHOS 79  BILITOT 1.0  PROT 6.1*  ALBUMIN 2.9*   No results for input(s): LIPASE, AMYLASE in the last 168 hours. No results for input(s): AMMONIA in the last 168 hours. CBC: Recent Labs  Lab 06/03/18 2348  WBC 7.4  NEUTROABS 5.2  HGB 11.6*  HCT 36.8  MCV 90.0  PLT 222   Cardiac Enzymes: Recent Labs  Lab 06/03/18 2348  TROPONINI <0.03   BNP: Invalid input(s): POCBNP CBG: No results for input(s): GLUCAP in the last 168 hours. D-Dimer No results for input(s): DDIMER in the last 72 hours. Hgb A1c No results for input(s): HGBA1C in the last 72 hours. Lipid Profile No results for input(s): CHOL, HDL, LDLCALC, TRIG, CHOLHDL, LDLDIRECT in the last 72 hours. Thyroid function studies No results for input(s): TSH, T4TOTAL, T3FREE, THYROIDAB in the last 72 hours.  Invalid input(s): FREET3 Anemia work up No results for input(s): VITAMINB12, FOLATE, FERRITIN, TIBC, IRON, RETICCTPCT in the last 72 hours. Urinalysis    Component Value Date/Time   COLORURINE STRAW (A) 03/08/2018 1324   APPEARANCEUR CLEAR (A) 03/08/2018 1324   LABSPEC 1.020 03/27/2018 1944   PHURINE 5.5 03/27/2018 1944   GLUCOSEU NEGATIVE 03/27/2018 1944   HGBUR NEGATIVE 03/27/2018 1944   BILIRUBINUR NEGATIVE 03/27/2018 1944   KETONESUR NEGATIVE 03/27/2018 1944   PROTEINUR NEGATIVE 03/27/2018 1944    UROBILINOGEN 0.2 03/27/2018 1944   NITRITE NEGATIVE 03/27/2018 1944   LEUKOCYTESUR NEGATIVE 03/27/2018 1944   Sepsis Labs Invalid input(s): PROCALCITONIN,  WBC,  LACTICIDVEN Microbiology Recent Results (from the past 240 hour(s))  MRSA PCR Screening     Status: None   Collection Time: 06/04/18  5:10 AM  Result Value Ref Range Status   MRSA by PCR NEGATIVE NEGATIVE Final    Comment:        The GeneXpert MRSA Assay (FDA approved for NASAL specimens only), is one component of a comprehensive MRSA colonization surveillance program. It is not intended to diagnose MRSA infection nor to guide or monitor treatment for MRSA infections. Performed at [facility], [place] [address] [place], [place] [address]      Time coordinating discharge: 32 minutes  SIGNED:   Hosie Poisson, MD  [facility] 06/04/2018, 5:57 PM Pager   If 7PM-7AM, please contact night-coverage [email or web address] Password [email or web address]

## 2018-06-04 NOTE — ED Notes (Signed)
Report given to Brittany RN

## 2018-06-05 DIAGNOSIS — R1084 Generalized abdominal pain: Secondary | ICD-10-CM | POA: Diagnosis not present

## 2018-06-05 DIAGNOSIS — Z515 Encounter for palliative care: Secondary | ICD-10-CM | POA: Diagnosis not present

## 2018-06-05 DIAGNOSIS — R109 Unspecified abdominal pain: Secondary | ICD-10-CM | POA: Diagnosis not present

## 2018-06-05 DIAGNOSIS — R0902 Hypoxemia: Secondary | ICD-10-CM | POA: Diagnosis not present

## 2018-06-05 DIAGNOSIS — I959 Hypotension, unspecified: Secondary | ICD-10-CM | POA: Diagnosis not present

## 2018-06-05 DIAGNOSIS — R609 Edema, unspecified: Secondary | ICD-10-CM | POA: Diagnosis not present

## 2018-06-05 DIAGNOSIS — M2550 Pain in unspecified joint: Secondary | ICD-10-CM | POA: Diagnosis not present

## 2018-06-05 DIAGNOSIS — Z7401 Bed confinement status: Secondary | ICD-10-CM | POA: Diagnosis not present

## 2018-06-05 NOTE — Progress Notes (Signed)
Patient was awaiting hospice bed at [facility] place that is now ready.  Vital signs appear stable.  She is in no acute distress, hard of hearing, and has lower extremity edema with mild erythema noted of the left leg.  Discharge summary completed on 3/10.  Order placed for discharge.

## 2018-06-05 NOTE — TOC Transition Note (Signed)
Transition of Care [facility]) - CM/SW Discharge Note   Patient Details  Name: Alexis Winters MRN: [id number] Date of Birth: December 11, 1927  Transition of Care [facility]) CM/SW Contact:  Estanislado Emms, LCSW Phone Number: 06/05/2018, 9:38 AM   Clinical Narrative:  Patient will discharge to [facility] Anticipated discharge date: 06/05/2018 Family notified: Lajuana Matte, daughter Transportation by: Corey Harold  Nurse to call report to [phone].  CSW signing off.  Estanislado Emms, LCSWA  Clinical Social Worker    Final next level of care: [place] Barriers to Discharge: No Barriers Identified   Patient Goals and CMS Choice   CMS Medicare.gov Compare Post Acute Care list provided to:: Other (Comment Required)(NA) Choice offered to / list presented to : NA  Discharge Placement              Patient chooses bed at: [facility]) Patient to be transferred to facility by: [place] Name of family member notified: Lajuana Matte, daughter Patient and family notified of of transfer: 06/05/18  Discharge Plan and Services   Post Acute Care Choice: Hospice                    Social Determinants of Health (SDOH) Interventions     Readmission Risk Interventions No flowsheet data found.

## 2018-06-05 NOTE — Plan of Care (Signed)

## 2018-06-05 NOTE — Progress Notes (Signed)
[place] room available for patient this morning. Plan to meet daughter Andrey Campanile at noon at hospital to complete paper work. Andrey Campanile is at home this morning preparing for transfer. Will update CSW Manuela Schwartz when paper work is complete. Discharge summary has been sent.   RN please call report to [facility] prior to patient leaving the unit.   Thank you,  Erling Conte, LCSW [phone]

## 2018-06-26 DEATH — deceased

## 2019-02-15 IMAGING — CT CT CHEST W/O CM
2 of 4 series · 15 of 36 positions shown, 18 images · non-contrast
Comparison: 11/10/2005 and 08/17/2004 CT

CLINICAL DATA: 11 mm nodular density overlying the right upper lobe
not previously seen.

EXAM:
CT CHEST WITHOUT CONTRAST
TECHNIQUE: Multidetector CT imaging of the chest was performed following the
standard protocol without IV contrast.

[Series 2: thorax · axial · 0.54mm/px · z∈[-198,+12]mm · 12 of 123 slices shown, 15 images]
[im 9/123  mediastinal]
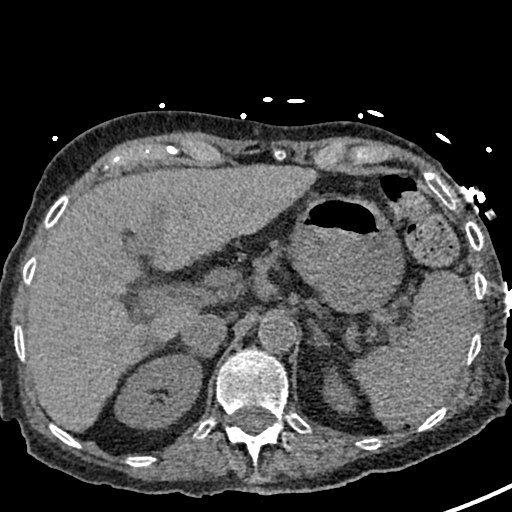
[im 9/123  lung]
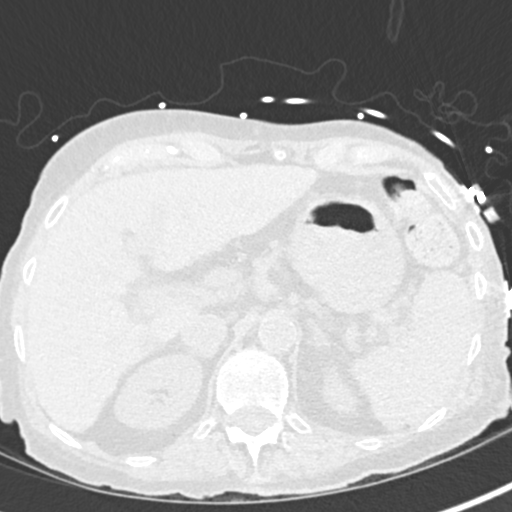
[im 18/123  lung]
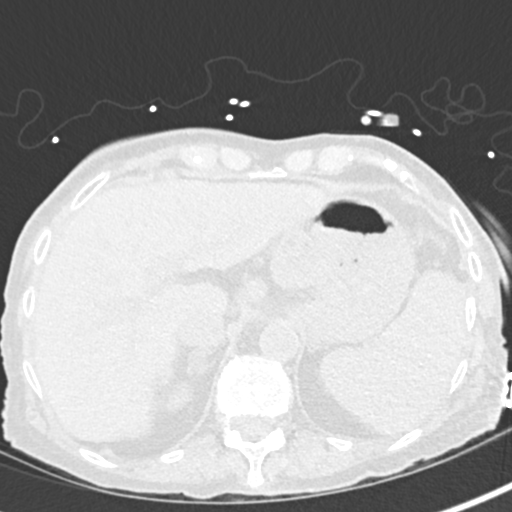
[im 27/123  lung]
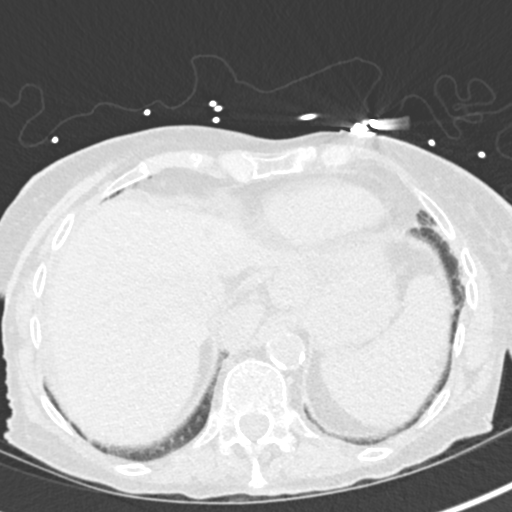
[im 35/123  lung]
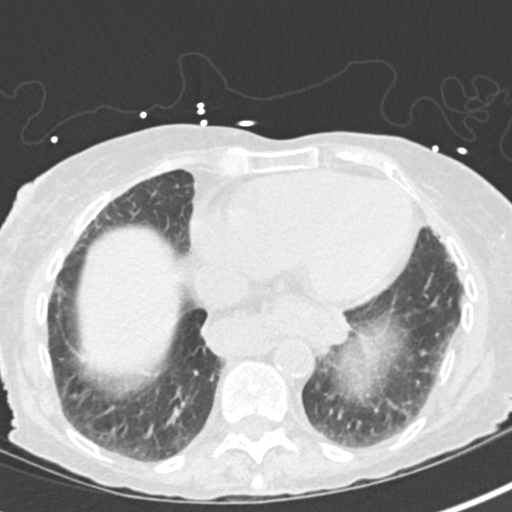
[im 44/123  mediastinal]
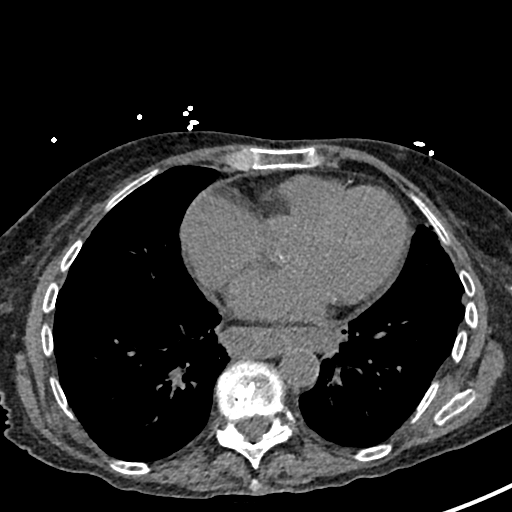
[im 44/123  lung]
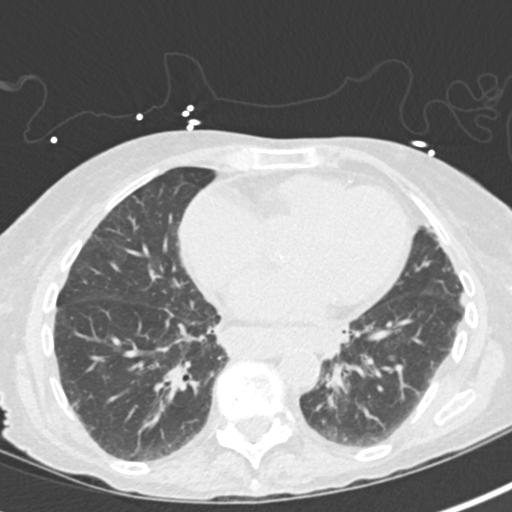
[im 53/123  lung]
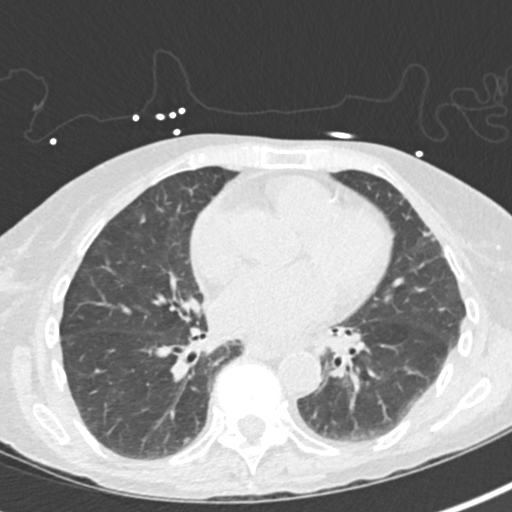
[im 70/123  lung]
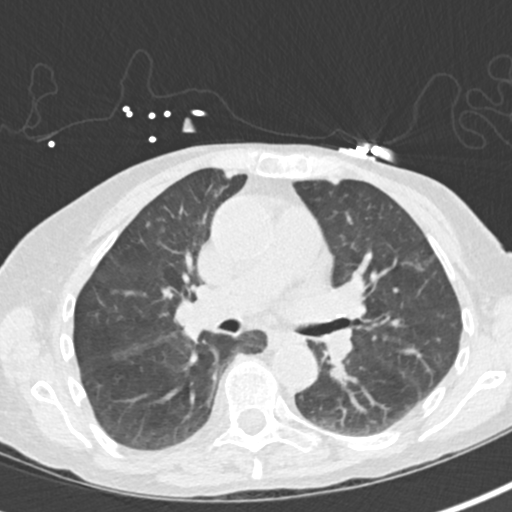
[im 79/123  lung]
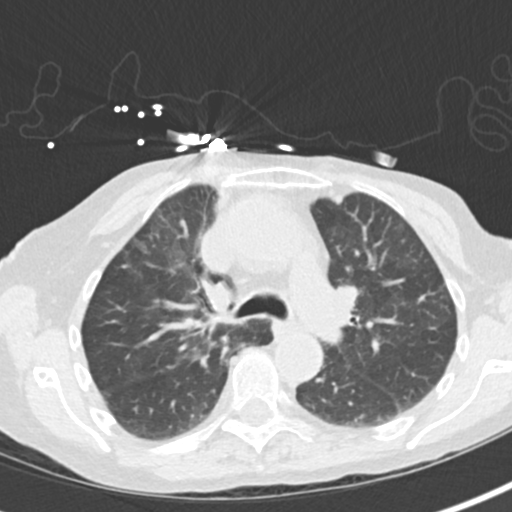
[im 88/123  mediastinal]
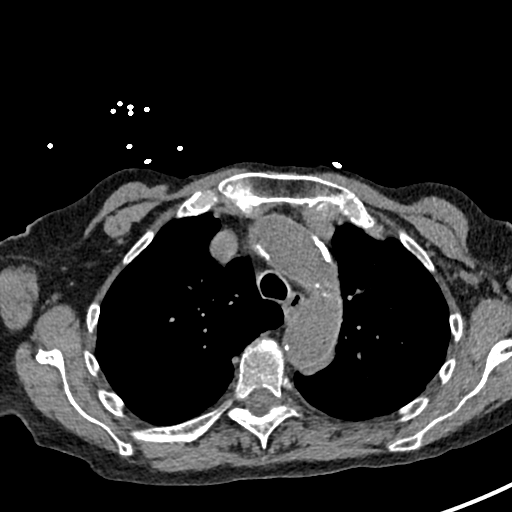
[im 88/123  lung]
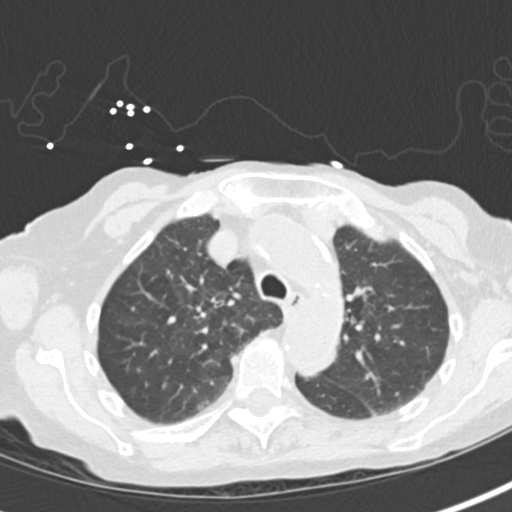
[im 96/123  lung]
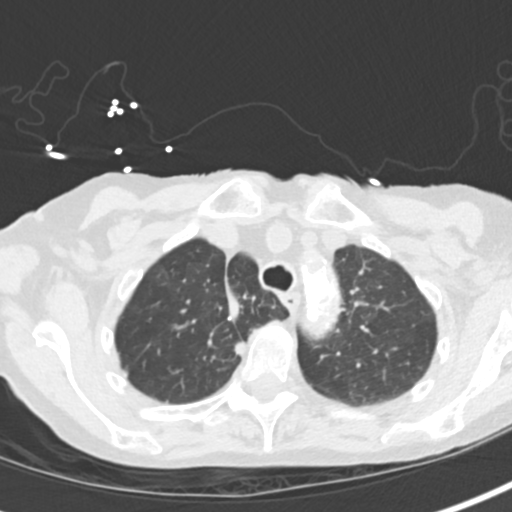
[im 105/123  lung]
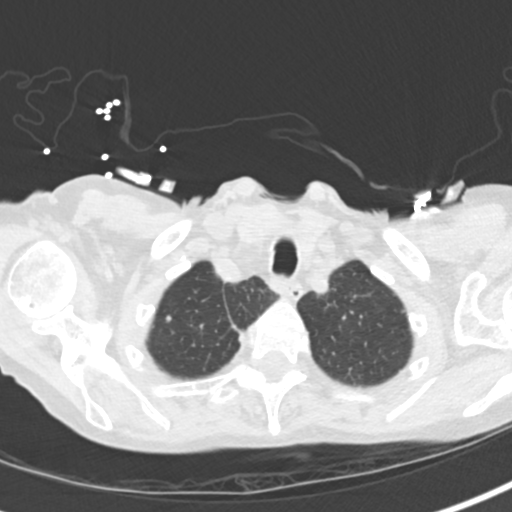
[im 114/123  lung]
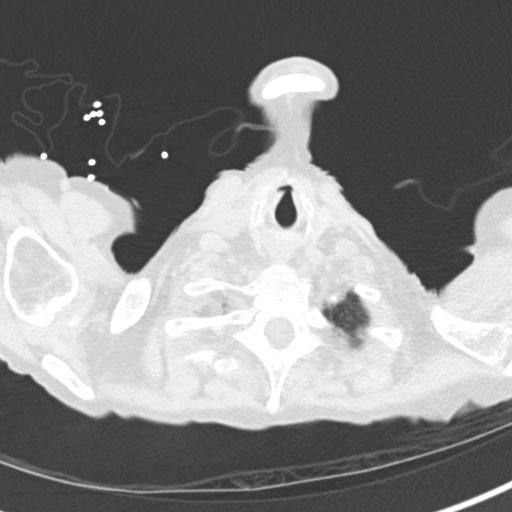

[Series 5: coronal · coronal · 0.52mm/px · 3 of 94 slices shown]
[im 19/94  lung]
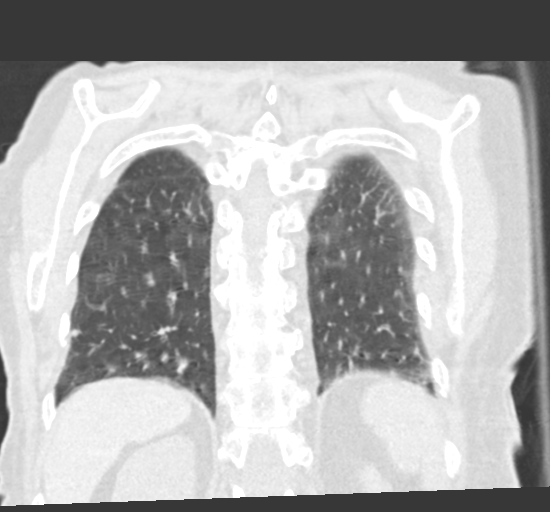
[im 38/94  lung]
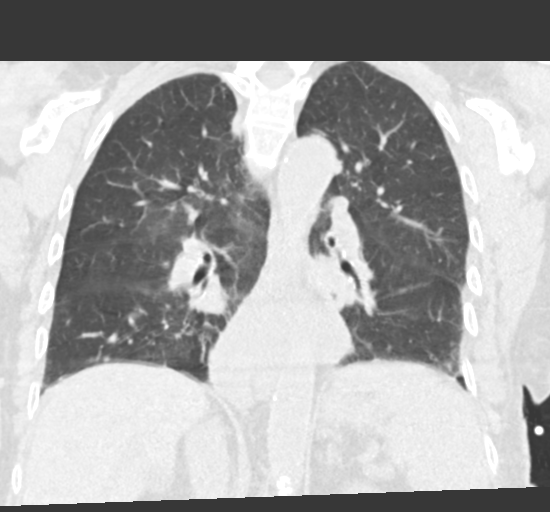
[im 56/94  lung]
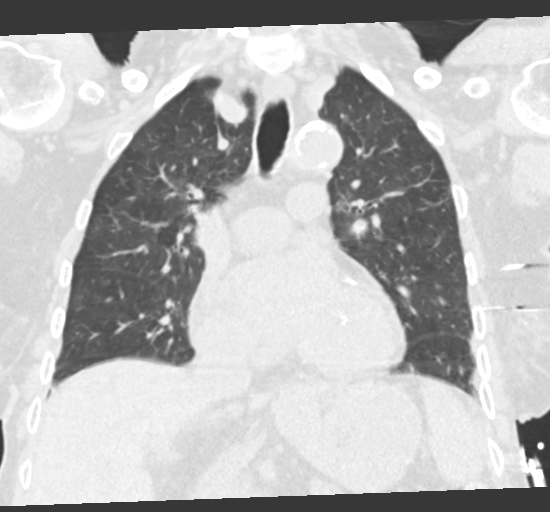

[15 of 36 positions shown; findings below may reference images not displayed]

FINDINGS: Cardiovascular: Cardiomegaly without pericardial effusion or
thickening. Coronary arteriosclerosis is noted as well as mild
aortic atherosclerosis. No aneurysm. The unenhanced pulmonary
vasculature is unremarkable.

Mediastinum/Nodes: Moderate-sized hiatal hernia. Patent trachea and
mainstem bronchi. No adenopathy.

Lungs/Pleura: There are least 3 calcified nodules in the right upper
lobe compatible with old granulomatous disease, series [DATE] and
series [DATE], the largest measuring approximately 4 mm. These appear
stable. A pleural-based 6 mm nodular density in the right middle
lobe is identified, series [DATE] with a subpleural 5.4 mm in average
nodule in the right lower lobe, series [DATE]. Both of these findings
appear stable and chronic. Subpleural areas of atelectasis are noted
in both lower lobes, right middle lobe and lingula. No findings to
account for the opacity seen on the chest radiograph from earlier
today. No bone islands are noted of the ribs in this location to
account for this finding.

An azygos fissure is noted with pleural thickening as before. Faint
ground-glass opacities are noted in the upper lobes bilaterally,
nonspecific but may be reflective of mild pneumonitis/alveolitis
hypoventilatory change among some considerations.

Upper Abdomen: No acute abnormality.

Musculoskeletal: Mild dextroconvex curvature of the midthoracic
spine with thoracic spondylosis. No aggressive osseous lesions.
Osteoarthritis of the right glenohumeral joint with subchondral
cystic change of the included humeral head.
IMPRESSION: 1. No findings to account for the opacity seen on the chest
radiograph from earlier today. Findings may have represented a
summation of overlapping ribs and pulmonary vasculature.
2. Stable right upper, middle and lower lobe pulmonary nodules
measuring up to 6 mm in average diameter. Given stability since
4883, no further repeat imaging is believed necessary unless
clinical situation warrants reassessment.
3. Moderate-sized hiatal hernia.

Aortic Atherosclerosis ([id number]).
